# Patient Record
Sex: Female | Born: 1940
Health system: Southern US, Community
[De-identification: ages and names within clinical notes are randomized; demographics above are authoritative.]

## PROBLEM LIST (undated history)

## (undated) DIAGNOSIS — Z9889 Other specified postprocedural states: Secondary | ICD-10-CM

## (undated) DIAGNOSIS — E118 Type 2 diabetes mellitus with unspecified complications: Secondary | ICD-10-CM

## (undated) DIAGNOSIS — N183 Chronic kidney disease, stage 3 unspecified: Secondary | ICD-10-CM

## (undated) DIAGNOSIS — D649 Anemia, unspecified: Secondary | ICD-10-CM

## (undated) DIAGNOSIS — E876 Hypokalemia: Secondary | ICD-10-CM

## (undated) DIAGNOSIS — I38 Endocarditis, valve unspecified: Secondary | ICD-10-CM

## (undated) DIAGNOSIS — I2510 Atherosclerotic heart disease of native coronary artery without angina pectoris: Secondary | ICD-10-CM

## (undated) DIAGNOSIS — E785 Hyperlipidemia, unspecified: Secondary | ICD-10-CM

## (undated) DIAGNOSIS — I10 Essential (primary) hypertension: Secondary | ICD-10-CM

## (undated) DIAGNOSIS — I214 Non-ST elevation (NSTEMI) myocardial infarction: Secondary | ICD-10-CM

## (undated) DIAGNOSIS — R112 Nausea with vomiting, unspecified: Secondary | ICD-10-CM

## (undated) HISTORY — DX: Endocarditis, valve unspecified: I38

## (undated) HISTORY — PX: TOTAL HIP ARTHROPLASTY: SHX124

## (undated) HISTORY — PX: LUMBAR FUSION: SHX111

## (undated) HISTORY — PX: BACK SURGERY: SHX140

## (undated) HISTORY — DX: Type 2 diabetes mellitus with unspecified complications: E11.8

## (undated) HISTORY — DX: Hyperlipidemia, unspecified: E78.5

## (undated) HISTORY — PX: JOINT REPLACEMENT: SHX530

## (undated) HISTORY — DX: Chronic kidney disease, stage 3 (moderate): N18.3

## (undated) HISTORY — DX: Chronic kidney disease, stage 3 unspecified: N18.30

## (undated) HISTORY — PX: CORONARY STENT PLACEMENT: SHX1402

---

## 2004-12-07 ENCOUNTER — Ambulatory Visit: Payer: Self-pay | Admitting: Unknown Physician Specialty

## 2005-01-05 ENCOUNTER — Inpatient Hospital Stay: Payer: Self-pay | Admitting: Unknown Physician Specialty

## 2005-12-28 ENCOUNTER — Ambulatory Visit: Payer: Self-pay | Admitting: Unknown Physician Specialty

## 2007-01-09 ENCOUNTER — Ambulatory Visit: Payer: Self-pay | Admitting: Unknown Physician Specialty

## 2008-02-21 ENCOUNTER — Ambulatory Visit: Payer: Self-pay | Admitting: Unknown Physician Specialty

## 2008-02-28 ENCOUNTER — Ambulatory Visit: Payer: Self-pay | Admitting: Unknown Physician Specialty

## 2008-07-28 ENCOUNTER — Inpatient Hospital Stay (HOSPITAL_COMMUNITY): Admission: RE | Admit: 2008-07-28 | Discharge: 2008-08-04 | Payer: Self-pay | Admitting: Neurosurgery

## 2008-08-20 ENCOUNTER — Ambulatory Visit: Payer: Self-pay | Admitting: Vascular Surgery

## 2008-08-20 ENCOUNTER — Ambulatory Visit: Admission: RE | Admit: 2008-08-20 | Discharge: 2008-08-20 | Payer: Self-pay | Admitting: Neurosurgery

## 2008-08-20 ENCOUNTER — Encounter (INDEPENDENT_AMBULATORY_CARE_PROVIDER_SITE_OTHER): Payer: Self-pay | Admitting: Neurosurgery

## 2009-04-01 ENCOUNTER — Ambulatory Visit: Payer: Self-pay | Admitting: Unknown Physician Specialty

## 2010-05-07 ENCOUNTER — Ambulatory Visit: Payer: Self-pay | Admitting: Unknown Physician Specialty

## 2010-05-11 IMAGING — CR DG CHEST 2V
2 series · 2 of 2 positions shown · non-contrast
Comparison: None

CLINICAL DATA: Preadmission for back surgery

CHEST - 2 VIEW

[view not recorded (1 of 2)]
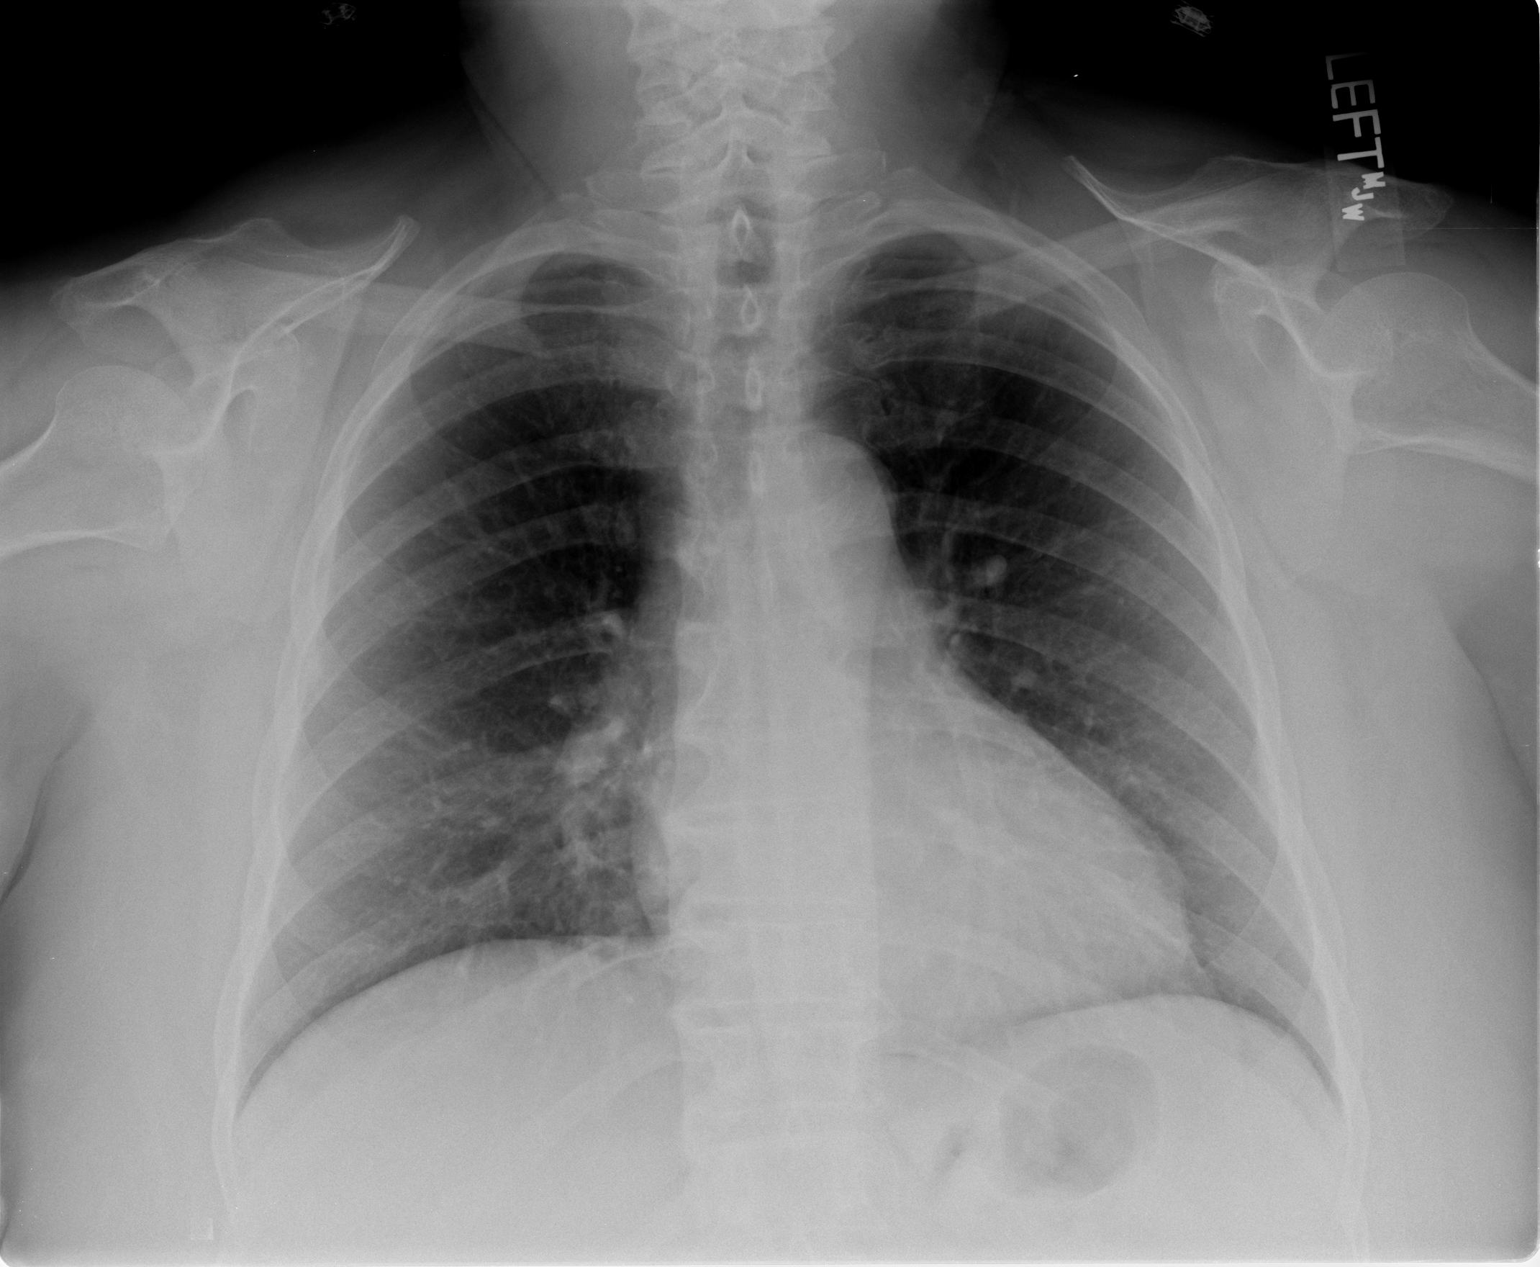

[view not recorded (2 of 2)]
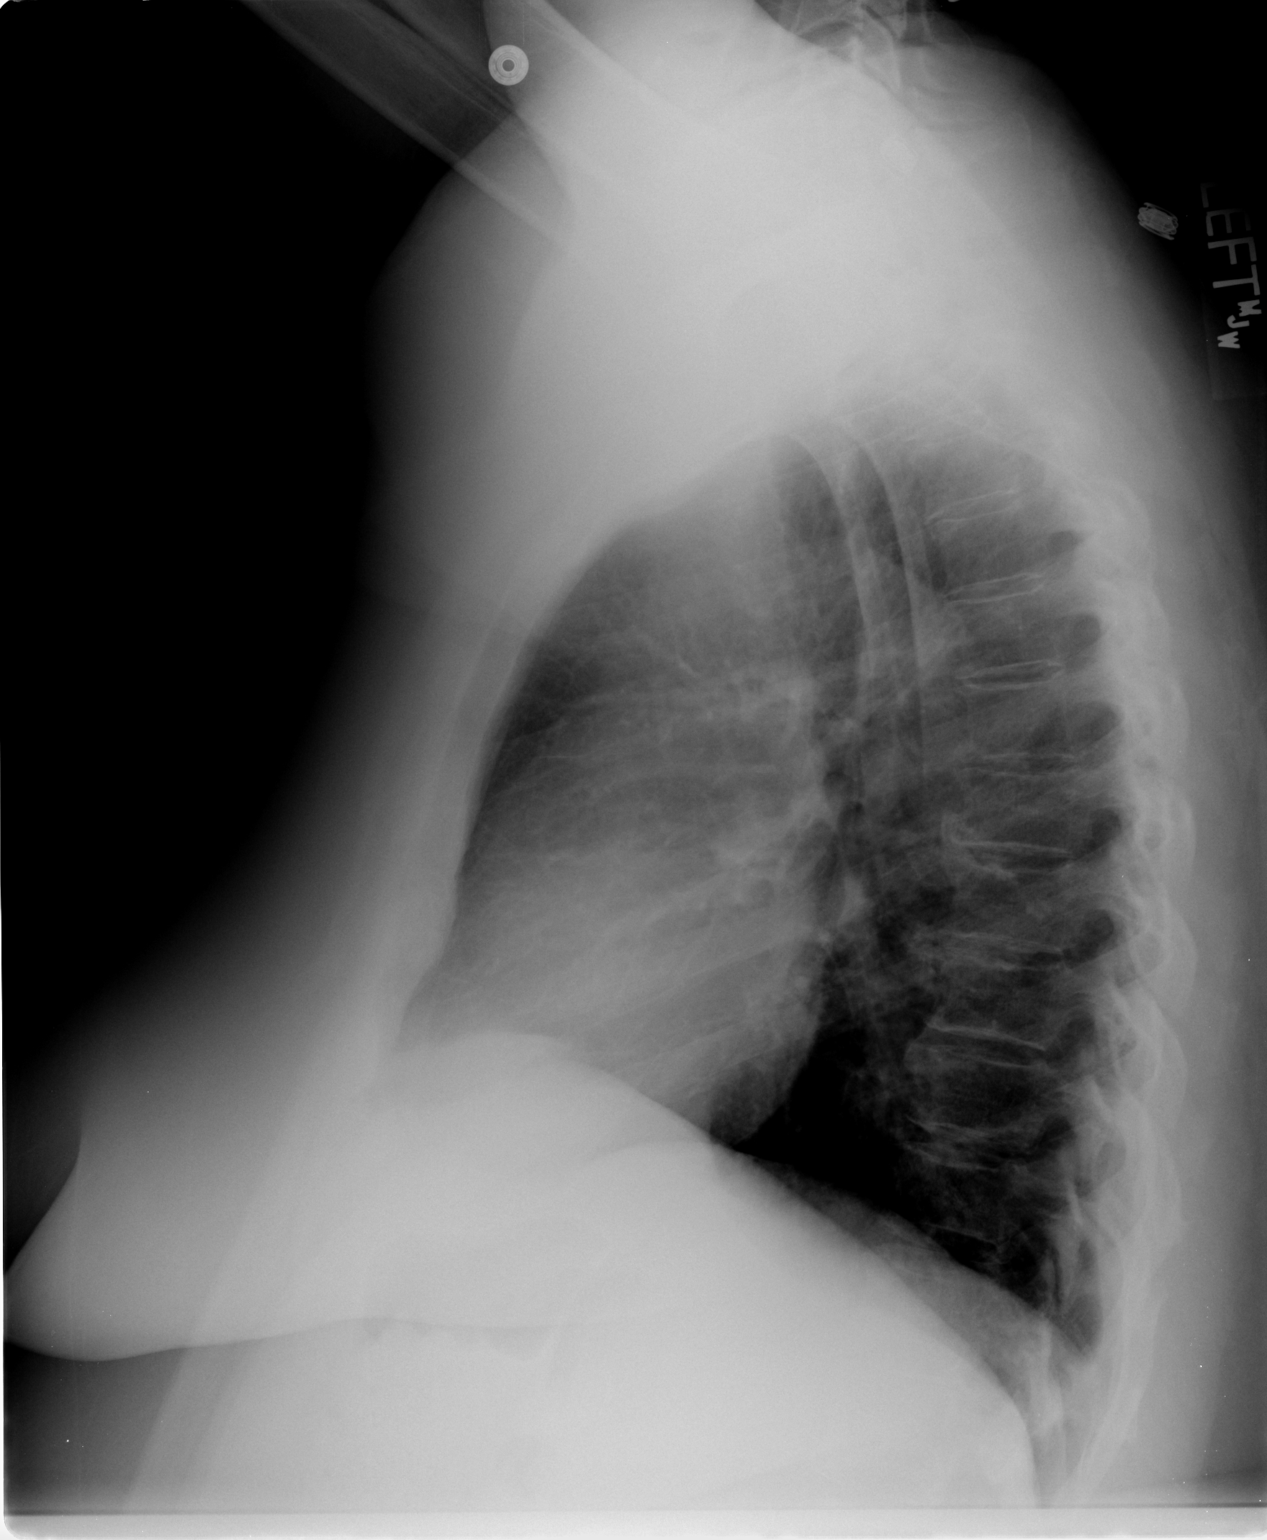

[2 of 2 positions shown; findings below may reference images not displayed]

FINDINGS: The heart size and mediastinal contours are normal.  The
lungs are clear.  There is no pleural effusion or pneumothorax.
Thoracic spine osteophytes are present.  There are no acute osseous
findings.
IMPRESSION: No active cardiopulmonary process.

## 2010-10-18 LAB — GLUCOSE, CAPILLARY
Glucose-Capillary: 101 mg/dL — ABNORMAL HIGH (ref 70–99)
Glucose-Capillary: 104 mg/dL — ABNORMAL HIGH (ref 70–99)
Glucose-Capillary: 105 mg/dL — ABNORMAL HIGH (ref 70–99)
Glucose-Capillary: 106 mg/dL — ABNORMAL HIGH (ref 70–99)
Glucose-Capillary: 134 mg/dL — ABNORMAL HIGH (ref 70–99)
Glucose-Capillary: 141 mg/dL — ABNORMAL HIGH (ref 70–99)
Glucose-Capillary: 200 mg/dL — ABNORMAL HIGH (ref 70–99)
Glucose-Capillary: 218 mg/dL — ABNORMAL HIGH (ref 70–99)
Glucose-Capillary: 224 mg/dL — ABNORMAL HIGH (ref 70–99)
Glucose-Capillary: 230 mg/dL — ABNORMAL HIGH (ref 70–99)

## 2010-10-18 LAB — CBC
HCT: 27.1 % — ABNORMAL LOW (ref 36.0–46.0)
HCT: 27.9 % — ABNORMAL LOW (ref 36.0–46.0)
HCT: 37.4 % (ref 36.0–46.0)
Hemoglobin: 12.2 g/dL (ref 12.0–15.0)
Hemoglobin: 9.1 g/dL — ABNORMAL LOW (ref 12.0–15.0)
Hemoglobin: 9.3 g/dL — ABNORMAL LOW (ref 12.0–15.0)
MCHC: 32.5 g/dL (ref 30.0–36.0)
MCHC: 33.3 g/dL (ref 30.0–36.0)
MCV: 91.6 fL (ref 78.0–100.0)
MCV: 91.8 fL (ref 78.0–100.0)
Platelets: 263 10*3/uL (ref 150–400)
RBC: 2.96 MIL/uL — ABNORMAL LOW (ref 3.87–5.11)
RDW: 13.8 % (ref 11.5–15.5)
RDW: 13.9 % (ref 11.5–15.5)
WBC: 13.8 10*3/uL — ABNORMAL HIGH (ref 4.0–10.5)

## 2010-10-18 LAB — BASIC METABOLIC PANEL
BUN: 19 mg/dL (ref 6–23)
CO2: 25 mEq/L (ref 19–32)
CO2: 27 mEq/L (ref 19–32)
Calcium: 9.5 mg/dL (ref 8.4–10.5)
Chloride: 104 mEq/L (ref 96–112)
GFR calc non Af Amer: 38 mL/min — ABNORMAL LOW (ref >60)
GFR calc non Af Amer: 47 mL/min — ABNORMAL LOW (ref >60)
Glucose, Bld: 169 mg/dL — ABNORMAL HIGH (ref 70–99)
Glucose, Bld: 76 mg/dL (ref 70–99)
Potassium: 4.7 mEq/L (ref 3.5–5.1)
Potassium: 5.1 mEq/L (ref 3.5–5.1)
Sodium: 136 mEq/L (ref 135–145)
Sodium: 139 mEq/L (ref 135–145)

## 2010-10-18 LAB — TYPE AND SCREEN
ABO/RH(D): A POS
Antibody Screen: POSITIVE
Donor AG Type: NEGATIVE
PT AG Type: NEGATIVE

## 2010-10-19 LAB — GLUCOSE, CAPILLARY: Glucose-Capillary: 127 mg/dL — ABNORMAL HIGH (ref 70–99)

## 2010-11-16 NOTE — Op Note (Signed)
Melissa Christian, Melissa Christian NO.:  [id number]   MEDICAL RECORD NO.:  [id number]          PATIENT TYPE:  INP   LOCATION:  3037                         FACILITY:  [facility]   PHYSICIAN:  Ophelia Charter, M.D.DATE OF BIRTH:  16-Aug-1940   DATE OF PROCEDURE:  07/28/2008  DATE OF DISCHARGE:                               OPERATIVE REPORT   BRIEF HISTORY:  The patient is a 70 year old white female who has  suffered from a back and leg pain.  She was worked up with a lumbar MRI  which demonstrated she had disk herniation and foraminal stenosis at L5-  S1.  I discussed the various treatment options with the patient  including surgery.  She has weighed the risks, benefits, and  alternatives of surgery and would like to proceed with an L5-S1  posterior decompression, instrumentation and fusion.   PREOPERATIVE DIAGNOSES:  L5-S1 disk degeneration, stenosis, lumbar  radiculopathy/myelopathy, lumbago.   POSTOPERATIVE DIAGNOSES:  L5-S1 disk degeneration, stenosis, lumbar  radiculopathy/myelopathy, lumbago.   PROCEDURE:  Bilateral L5 laminotomies and foraminotomies to decompress  the bilateral L5 as well as S1 nerve roots; L5-S1 posterior lumbar  interbody fusion with local morselized autograft bone and  Actifuse/Vitoss bone graft extender; insertion of L5-S1 interbody  prosthesis (Capstone PEEK interbody prosthesis); L5-S1 posterior  nonsegmental instrumentation with Legacy titanium pedicle screws and  rods; L5-S1 posterolateral arthrodesis with local morselized autograft  bone with Actifuse and Vitoss bone graft extenders.   SURGEON:  Ophelia Charter, MD   ASSISTANT:  Elizabeth Sauer, MD   ANESTHESIA:  General endotracheal.   ESTIMATED BLOOD LOSS:  250 mL.   SPECIMENS:  None.   DRAINS:  None.   COMPLICATIONS:  None.   DESCRIPTION OF PROCEDURE:  The patient was brought to the operating room  by the Anesthesia Team.  General endotracheal anesthesia was induced.  The patient  was then carefully turned to the prone position on the  Wilson frame.  Her lumbosacral region was then prepared with Betadine  scrub and Betadine solution.  Sterile drapes were applied.  I then  injected the area to be incised with Marcaine with epinephrine solution.  I used a scalpel to make a linear midline incision over the L5-S1  interspace.  I used electrocautery performing bilateral subperiosteal  dissection exposing the spinous process lamina of L4, L5 in the upper  sacrum.  We obtained intraoperative radiograph to confirm our location  and inserted the Versa-Trac retractor for exposure.   We began the decompression by using a high-speed drill to perform  bilateral L5 laminotomies.  Of note, the extent of work required to  decompress this nerve was greater than required due to posterior lumbar  interbody fusion because of the severe foraminal and lateral recess  stenosis.  We widened the foraminotomies with the Kerrison punch and  removed the L5-S1 ligamentum flavum and performed wide foraminotomies  about the L5-S1 nerve roots completing the decompression.   We now turned our attention to the posterior lumbar interbody fusion.  I  incised the L5-S1 intervertebral disk with a 15 blade scalpel  bilaterally.  The disk space was quite collapsed and spondylotic.  We  then cleared the vertebral endplates soft tissue using the pituitary  forceps and the Epstein and the Scoville curettes.  We then used the  trial sizers and first put the 8 x 26 mm spacer which seemed a bit  loose.  I then put a 10 x 26 pacer.  Again, it was a bit loose.  I then  placed a 12 x 26 spacer and it seems none.  We then prefilled the disk  space with local autograft bone, Actifuse and Vitoss and then inserted a  12 x 26 mm PEEK interbody prosthesis of course after retracting neural  structures out of harm's way.  When we did this, it seemed that the disk  space was more or less released in that the height of  disk space was  greatly increased.  I inspected the vertebral endplates to see if there  is any evidence of the prosthesis had entered into the vertebral body  and the vertebral endplates were intact.  At this point, the 12 x 26 mm  spacer seemed a bit loose so I removed it and then placed 14 x 26 spacer  bilaterally which were a bit more snug.  This completed the posterior  lumbar interbody fusion.  We did not have any untoward bleeding during  the PLIF.   We now turned our attention to the posterior instrumentation.  Under  fluoroscopic guidance, we cannulated the bilateral L5-S1 pedicles.  I  tapped the pedicle with a 5.5-mm tap and then inserted 7.5 x 45 mg  pedicle screws bilaterally at S1 and 7.5 x 15 bilaterally at L5.  This  was done under fluoroscopic guidance.  I should mention that prior to  placing the pedicle screws, we probed inside the tapped pedicles to rule  out cortical breeches.  We got a good purchase with all four screws.  I  then connected the pedicle screws with a lordotic rod.  We compressed  the construct and then secured around in place with the caps which was  tightened appropriately.  We then inspected the prosthesis which were at  this point quite snug in the interspace.   We now turned our attention to posterolateral arthrodesis.  We used a  high-speed drill to decorticate the remainder of the L5-S1 facet, pars  and L5 transverse prosthesis in the upper sacrum.  We laid a combination  of local autograft bone, Vitoss and Actifuse bone graft extenders over  these decorticated posterolateral structures.  This completed the  posterolateral arthrodesis.   We then obtained hemostasis using bipolar electrocautery.  We irrigated  the wound out with bacitracin solution.  We inspected the thecal sac and  bilateral L5 and S1 nerve roots and noted they were well decompressed.  We then removed the retractor and reapproximated the patient's  thoracolumbar fascia with  interrupted #1 Vicryl suture, the subcutaneous  tissue with 2-0 Vicryl suture and the skin with Steri-Strips and  benzoin.  The wound was then coated with bacitracin ointment.  A sterile  dressing was applied.  The drapes were removed.  The patient was  subsequently returned to the supine position where she was extubated by  the Anesthesia Team and transported to the postanesthesia care unit in  stable condition.  All sponge, instrument and needle counts were correct  at the end of this case.      Ophelia Charter, M.D.  Electronically Signed  JDJ/MEDQ  D:  07/29/2008  T:  07/30/2008  Job:  [id number]

## 2010-11-19 NOTE — Discharge Summary (Signed)
NAMEDENIZE, HANKE NO.:  [id number]   MEDICAL RECORD NO.:  [id number]          PATIENT TYPE:  INP   LOCATION:  3037                         FACILITY:  [facility]   PHYSICIAN:  Ophelia Charter, M.D.DATE OF BIRTH:  1940-10-13   DATE OF ADMISSION:  07/28/2008  DATE OF DISCHARGE:  08/04/2008                               DISCHARGE SUMMARY   BRIEF HISTORY:  The patient is a 70 year old white female who suffered  from back and leg pain.  She was worked up with a lumbar MRI, which  demonstrated she had degeneration, herniated disk, and foraminal  stenosis at L5-S1.  I discussed the various treatments with the patient  including surgery.  She has weighed the risks, benefits, and  alternatives of surgery and decided to proceed with an L5-S1 posterior  decompression, instrumentation and fusion.   For further details of this admission, please refer to history and  physical.   HOSPITAL COURSE:  Admitted the patient to [facility] on July 28, 2008.  On the day of admission, I performed L5-S1 decompression,  instrumentation and fusion.  Surgery went well (for full details of this  operation, please refer to typed operative note).   POSTOPERATIVE COURSE:  The patient's postoperative course was  unremarkable.  She was discharged home on August 04, 2008.   DISCHARGE INSTRUCTIONS:  The patient was given written discharge  instructions and instructed to follow up with me in 4 weeks.   DISCHARGE PRESCRIPTIONS:  1. Percocet 10/325, #100, 1 p.o. q.4 h. p.r.n. for pain.  2. Valium 5 mg, #50, 1 p.o. q.8 h. p.r.n. for muscle spasms.   FINAL DIAGNOSES:  1. L5-S1 degenerative disk disease and stenosis.  2. Lumbar radiculopathy/myelopathy.  3. Lumbago.   PROCEDURE PERFORMED:  Bilateral laminotomies and foraminotomies to  decompress the bilateral L5 as well as S1 nerve roots; L5-S1 posterior  lumbar interbody fusion with  local morselized autograft bone and  Actifuse/Vitoss bone graft extender; insertion of L5-S1 interbody  prosthesis (PEEK interbody prosthesis); L5-S1 posterior nonsegmental  instrumentation with Legacy titanium pedicle screws and rods; L5-S1  posterolateral arthrodesis with local morselized autograft bone and  Actifuse/Vitoss bone graft extenders.     Ophelia Charter, M.D.  Electronically Signed    JDJ/MEDQ  D:  09/18/2008  T:  09/19/2008  Job:  [id number]

## 2011-08-24 ENCOUNTER — Ambulatory Visit: Payer: Self-pay | Admitting: Unknown Physician Specialty

## 2012-08-27 ENCOUNTER — Ambulatory Visit: Payer: Self-pay | Admitting: Unknown Physician Specialty

## 2014-07-24 ENCOUNTER — Emergency Department: Payer: Self-pay | Admitting: Internal Medicine

## 2014-07-24 LAB — TROPONIN I

## 2014-07-24 LAB — COMPREHENSIVE METABOLIC PANEL
ALBUMIN: 3.7 g/dL (ref 3.4–5.0)
ALT: 20 U/L
AST: 14 U/L — AB (ref 15–37)
Alkaline Phosphatase: 116 U/L
Anion Gap: 7 (ref 7–16)
BUN: 22 mg/dL — AB (ref 7–18)
Bilirubin,Total: 0.4 mg/dL (ref 0.2–1.0)
CALCIUM: 8.4 mg/dL — AB (ref 8.5–10.1)
Chloride: 100 mmol/L (ref 98–107)
Co2: 27 mmol/L (ref 21–32)
Creatinine: 1.79 mg/dL — ABNORMAL HIGH (ref 0.60–1.30)
GFR CALC AF AMER: 36 — AB
GFR CALC NON AF AMER: 29 — AB
GLUCOSE: 336 mg/dL — AB (ref 65–99)
OSMOLALITY: 285 (ref 275–301)
POTASSIUM: 4.2 mmol/L (ref 3.5–5.1)
Sodium: 134 mmol/L — ABNORMAL LOW (ref 136–145)
TOTAL PROTEIN: 7.6 g/dL (ref 6.4–8.2)

## 2014-07-24 LAB — CBC
HCT: 39.5 % (ref 35.0–47.0)
HGB: 12.5 g/dL (ref 12.0–16.0)
MCH: 29.6 pg (ref 26.0–34.0)
MCHC: 31.7 g/dL — AB (ref 32.0–36.0)
MCV: 93 fL (ref 80–100)
PLATELETS: 216 10*3/uL (ref 150–440)
RBC: 4.23 10*6/uL (ref 3.80–5.20)
RDW: 13.8 % (ref 11.5–14.5)
WBC: 7.6 10*3/uL (ref 3.6–11.0)

## 2014-07-24 LAB — PRO B NATRIURETIC PEPTIDE: B-Type Natriuretic Peptide: 394 pg/mL — ABNORMAL HIGH (ref 0–125)

## 2014-11-21 ENCOUNTER — Other Ambulatory Visit: Payer: Self-pay | Admitting: Internal Medicine

## 2014-11-21 DIAGNOSIS — Z1231 Encounter for screening mammogram for malignant neoplasm of breast: Secondary | ICD-10-CM

## 2014-12-04 ENCOUNTER — Ambulatory Visit
Admission: RE | Admit: 2014-12-04 | Discharge: 2014-12-04 | Disposition: A | Discharge status: Home / Self Care | Payer: Medicare Other | Source: Ambulatory Visit | Attending: Internal Medicine | Admitting: Internal Medicine

## 2014-12-04 ENCOUNTER — Other Ambulatory Visit: Payer: Self-pay | Admitting: Internal Medicine

## 2014-12-04 DIAGNOSIS — R922 Inconclusive mammogram: Secondary | ICD-10-CM | POA: Insufficient documentation

## 2014-12-04 DIAGNOSIS — Z1231 Encounter for screening mammogram for malignant neoplasm of breast: Secondary | ICD-10-CM | POA: Diagnosis present

## 2015-09-22 ENCOUNTER — Emergency Department: Payer: Medicare Other

## 2015-09-22 ENCOUNTER — Inpatient Hospital Stay
Admission: EM | Admit: 2015-09-22 | Discharge: 2015-09-24 | DRG: 247 | Disposition: A | Discharge status: Home / Self Care | Payer: Medicare Other | Attending: Internal Medicine | Admitting: Internal Medicine

## 2015-09-22 ENCOUNTER — Encounter: Payer: Self-pay | Admitting: Emergency Medicine

## 2015-09-22 DIAGNOSIS — E1122 Type 2 diabetes mellitus with diabetic chronic kidney disease: Secondary | ICD-10-CM | POA: Diagnosis present

## 2015-09-22 DIAGNOSIS — I453 Trifascicular block: Secondary | ICD-10-CM | POA: Diagnosis present

## 2015-09-22 DIAGNOSIS — I2510 Atherosclerotic heart disease of native coronary artery without angina pectoris: Secondary | ICD-10-CM | POA: Insufficient documentation

## 2015-09-22 DIAGNOSIS — I10 Essential (primary) hypertension: Secondary | ICD-10-CM | POA: Diagnosis present

## 2015-09-22 DIAGNOSIS — I25110 Atherosclerotic heart disease of native coronary artery with unstable angina pectoris: Secondary | ICD-10-CM | POA: Diagnosis present

## 2015-09-22 DIAGNOSIS — Z7982 Long term (current) use of aspirin: Secondary | ICD-10-CM

## 2015-09-22 DIAGNOSIS — E785 Hyperlipidemia, unspecified: Secondary | ICD-10-CM | POA: Diagnosis present

## 2015-09-22 DIAGNOSIS — Z888 Allergy status to other drugs, medicaments and biological substances status: Secondary | ICD-10-CM

## 2015-09-22 DIAGNOSIS — Z7984 Long term (current) use of oral hypoglycemic drugs: Secondary | ICD-10-CM

## 2015-09-22 DIAGNOSIS — Z79899 Other long term (current) drug therapy: Secondary | ICD-10-CM

## 2015-09-22 DIAGNOSIS — Z96649 Presence of unspecified artificial hip joint: Secondary | ICD-10-CM | POA: Diagnosis present

## 2015-09-22 DIAGNOSIS — E119 Type 2 diabetes mellitus without complications: Secondary | ICD-10-CM

## 2015-09-22 DIAGNOSIS — R079 Chest pain, unspecified: Secondary | ICD-10-CM | POA: Diagnosis present

## 2015-09-22 DIAGNOSIS — I509 Heart failure, unspecified: Secondary | ICD-10-CM

## 2015-09-22 DIAGNOSIS — R7989 Other specified abnormal findings of blood chemistry: Secondary | ICD-10-CM

## 2015-09-22 DIAGNOSIS — I214 Non-ST elevation (NSTEMI) myocardial infarction: Principal | ICD-10-CM | POA: Diagnosis present

## 2015-09-22 DIAGNOSIS — Z833 Family history of diabetes mellitus: Secondary | ICD-10-CM

## 2015-09-22 DIAGNOSIS — Z8249 Family history of ischemic heart disease and other diseases of the circulatory system: Secondary | ICD-10-CM

## 2015-09-22 DIAGNOSIS — N183 Chronic kidney disease, stage 3 (moderate): Secondary | ICD-10-CM | POA: Diagnosis present

## 2015-09-22 DIAGNOSIS — R778 Other specified abnormalities of plasma proteins: Secondary | ICD-10-CM | POA: Diagnosis present

## 2015-09-22 DIAGNOSIS — Z881 Allergy status to other antibiotic agents status: Secondary | ICD-10-CM

## 2015-09-22 DIAGNOSIS — E7800 Pure hypercholesterolemia, unspecified: Secondary | ICD-10-CM | POA: Diagnosis present

## 2015-09-22 DIAGNOSIS — Z885 Allergy status to narcotic agent status: Secondary | ICD-10-CM

## 2015-09-22 DIAGNOSIS — I5032 Chronic diastolic (congestive) heart failure: Secondary | ICD-10-CM | POA: Diagnosis present

## 2015-09-22 DIAGNOSIS — Z9889 Other specified postprocedural states: Secondary | ICD-10-CM | POA: Insufficient documentation

## 2015-09-22 DIAGNOSIS — Z6841 Body Mass Index (BMI) 40.0 and over, adult: Secondary | ICD-10-CM

## 2015-09-22 DIAGNOSIS — E1159 Type 2 diabetes mellitus with other circulatory complications: Secondary | ICD-10-CM

## 2015-09-22 DIAGNOSIS — I130 Hypertensive heart and chronic kidney disease with heart failure and stage 1 through stage 4 chronic kidney disease, or unspecified chronic kidney disease: Secondary | ICD-10-CM | POA: Diagnosis present

## 2015-09-22 HISTORY — DX: Atherosclerotic heart disease of native coronary artery without angina pectoris: I25.10

## 2015-09-22 HISTORY — DX: Hypokalemia: E87.6

## 2015-09-22 HISTORY — DX: Essential (primary) hypertension: I10

## 2015-09-22 HISTORY — DX: Anemia, unspecified: D64.9

## 2015-09-22 LAB — TROPONIN I
TROPONIN I: 0.06 ng/mL — AB (ref <0.031)
TROPONIN I: 0.15 ng/mL — AB (ref <0.031)

## 2015-09-22 LAB — BASIC METABOLIC PANEL
ANION GAP: 9 (ref 5–15)
BUN: 31 mg/dL — ABNORMAL HIGH (ref 6–20)
CALCIUM: 9.1 mg/dL (ref 8.9–10.3)
CO2: 26 mmol/L (ref 22–32)
Chloride: 101 mmol/L (ref 101–111)
Creatinine, Ser: 1.7 mg/dL — ABNORMAL HIGH (ref 0.44–1.00)
GFR, EST AFRICAN AMERICAN: 33 mL/min — AB (ref >60)
GFR, EST NON AFRICAN AMERICAN: 28 mL/min — AB (ref >60)
Glucose, Bld: 266 mg/dL — ABNORMAL HIGH (ref 65–99)
POTASSIUM: 4 mmol/L (ref 3.5–5.1)
SODIUM: 136 mmol/L (ref 135–145)

## 2015-09-22 LAB — CBC
HCT: 39.7 % (ref 35.0–47.0)
HEMOGLOBIN: 13.2 g/dL (ref 12.0–16.0)
MCH: 30.4 pg (ref 26.0–34.0)
MCHC: 33.2 g/dL (ref 32.0–36.0)
MCV: 91.7 fL (ref 80.0–100.0)
PLATELETS: 276 10*3/uL (ref 150–440)
RBC: 4.32 MIL/uL (ref 3.80–5.20)
RDW: 14 % (ref 11.5–14.5)
WBC: 8.8 10*3/uL (ref 3.6–11.0)

## 2015-09-22 MED ORDER — ASPIRIN 81 MG PO CHEW
263.0000 mg | CHEWABLE_TABLET | Freq: Once | ORAL | Status: AC
  Administered 2015-09-22: 263 mg via ORAL
  Filled 2015-09-22: qty 3

## 2015-09-22 MED ORDER — FUROSEMIDE 10 MG/ML IJ SOLN
20.0000 mg | Freq: Once | INTRAMUSCULAR | Status: AC
  Administered 2015-09-22: 20 mg via INTRAVENOUS
  Filled 2015-09-22: qty 4

## 2015-09-22 NOTE — ED Provider Notes (Signed)
[facility] Emergency Department Provider Note  Time seen: 8:36 PM  I have reviewed the triage vital signs and the nursing notes.   HISTORY  Chief Complaint Chest Pain    HPI JAMIYA KELLAS is a 75 y.o. female with a past medical history of diabetes, hypertension, hyperlipidemia, CHF, presents the emergency department with intermittent chest pain over the past 2 days. According to the patient last night she developed a mild central chest discomfort, resolved after a short period. Today the patient once again developed a mild chest discomfort so she came to the emergency department for evaluation. Patient notes that upon arrival to the emergency department with chest pain was gone. States very mild shortness of breath. She did take one 10 mg Lasix tablet yesterday which she is prescribed when necessary. Denies any chest pain at this time. States when she was experiencing the chest pain it was very mild and felt more like a tightness sensation.Denies any symptoms at this time.     Past Medical History  Diagnosis Date  . Diabetes mellitus without complication ([place])   . Hypertension   . High cholesterol   . Anemia   . CHF (congestive heart failure) ([place])   . Low blood potassium     There are no active problems to display for this patient.   No past surgical history on file.  Current Outpatient Rx  Name  Route  Sig  Dispense  Refill  . aspirin EC 81 MG tablet   Oral   Take 81 mg by mouth daily.         . cholecalciferol (VITAMIN D) 1000 units tablet   Oral   Take 2,000 Units by mouth daily.         Marland Kitchen diltiazem (CARDIZEM CD) 240 MG 24 hr capsule   Oral   Take 480 mg by mouth daily.         . ferrous gluconate (FERGON) 324 MG tablet   Oral   Take 324 mg by mouth 2 (two) times daily with a meal.          . furosemide (LASIX) 40 MG tablet   Oral   Take 40 mg by mouth daily.         Marland Kitchen glipiZIDE (GLUCOTROL) 10 MG tablet   Oral   Take  20 mg by mouth 2 (two) times daily before a meal.         . hydrochlorothiazide (HYDRODIURIL) 25 MG tablet   Oral   Take 25 mg by mouth daily.         . metoprolol succinate (TOPROL-XL) 100 MG 24 hr tablet   Oral   Take 100 mg by mouth daily. Take with or immediately following a meal.         . pioglitazone (ACTOS) 30 MG tablet   Oral   Take 30 mg by mouth daily.         . potassium chloride (K-DUR,KLOR-CON) 10 MEQ tablet   Oral   Take 10 mEq by mouth daily.         . pravastatin (PRAVACHOL) 80 MG tablet   Oral   Take 80 mg by mouth at bedtime.         . sitaGLIPtin (JANUVIA) 50 MG tablet   Oral   Take 50 mg by mouth daily.         . valsartan (DIOVAN) 320 MG tablet   Oral   Take 320 mg by mouth daily.  Allergies Keflex; Percocet; and Vicodin  No family history on file.  Social History Social History  Substance Use Topics  . Smoking status: Never Smoker   . Smokeless tobacco: None  . Alcohol Use: No    Review of Systems Constitutional: Negative for fever. Cardiovascular: Intermittent chest pain. Respiratory: Mild shortness of breath. Gastrointestinal: Negative for abdominal pain Genitourinary: Negative for dysuria. Musculoskeletal: Negative for back pain. Neurological: Negative for headache 10-point ROS otherwise negative.  ____________________________________________   PHYSICAL EXAM:  VITAL SIGNS: ED Triage Vitals  Enc Vitals Group     BP 09/22/15 1753 189/76 mmHg     Pulse Rate 09/22/15 1753 77     Resp 09/22/15 1753 16     Temp 09/22/15 1753 97.4 F (36.3 C)     Temp Source 09/22/15 1753 Oral     SpO2 09/22/15 1753 99 %     Weight 09/22/15 1753 278 lb (126.1 kg)     Height 09/22/15 1753 5\' 4"  (1.626 m)     Head Cir --      Peak Flow --      Pain Score 09/22/15 1753 8     Pain Loc --      Pain Edu? --      Excl. in [place]? --     Constitutional: Alert and oriented. Well appearing and in no distress. Eyes: Normal  exam ENT   Head: Normocephalic and atraumatic   Mouth/Throat: Mucous membranes are moist. Cardiovascular: Normal rate, regular rhythm. No murmur Respiratory: Normal respiratory effort without tachypnea nor retractions. Breath sounds are clear Gastrointestinal: Soft and nontender. No distention.  Musculoskeletal: Nontender with normal range of motion in all extremities. Slight pedal edema. No tenderness. Neurologic:  Normal speech and language. No gross focal neurologic deficits Skin:  Skin is warm, dry and intact.  Psychiatric: Mood and affect are normal. Speech and behavior are normal.   ____________________________________________    EKG  EKG reviewed and interpreted by myself shows normal sinus rhythm at 76 bpm. Slightly widened QRS. Left axis deviation. Nonspecific but no concerning ST changes.  ____________________________________________    RADIOLOGY  Chest x-ray shows vascular congestion.  ____________________________________________   INITIAL IMPRESSION / ASSESSMENT AND PLAN / ED COURSE  Pertinent labs & imaging results that were available during my care of the patient were reviewed by me and considered in my medical decision making (see chart for details).  Patient presents the emergency department intermittent chest pain/tightness along with some mild shortness of breath. Chest x-ray shows vascular congestion. Patient states she takes Lasix just as needed, has not taken in the long time, but took it yesterday 10 mg. Denies any discomfort at this time. Denies any shortness of breath. Denies any chest pain. Patient states she has noted that over the past week or so the soreness breath has been worse when lying flat. I suspect the patient's discomfort and shortness of breath is likely due to to mild congestive heart failure exacerbation. We will repeat a troponin given a slight elevation which is likely due to renal insufficiency. If her repeat troponin is negative and  the patient remains asymptomatic we will likely discharge home with a short course of Lasix (5 days 20 mg daily) with cardiology follow-up. Patient is agreeable to this plan. I discussed with the patient any further chest pain, shortness breath she is to return to the emergency department for evaluation and likely admission.  Second troponin has elevated to 0.15. Given his elevation in troponin we will  admit the patient to the hospital for further treatment and monitoring. I will also dose IV Lasix.  ____________________________________________   FINAL CLINICAL IMPRESSION(S) / ED DIAGNOSES  Chest pain Dyspnea CHF  Harvest Dark, MD 09/22/15 2204

## 2015-09-22 NOTE — ED Notes (Signed)
Lab called with troponin of 0.06  Charge nurse raquel rn aware.

## 2015-09-22 NOTE — H&P (Signed)
Melissa Christian NAME: Kourtlyn Aberg    MR#:  [id number]  DATE OF BIRTH:  Jul 13, 1940  DATE OF ADMISSION:  09/22/2015  PRIMARY CARE PHYSICIAN: Glendon Axe, MD   REQUESTING/REFERRING PHYSICIAN: Kerman Passey, MD  CHIEF COMPLAINT:   Chief Complaint  Patient presents with  . Chest Pain    HISTORY OF PRESENT ILLNESS:  Melissa Christian  is a 75 y.o. female who presents with acute episode of chest pain. Patient states chest pain was central, pressure-like, radiating to the back. It lasted about one hour. It occurred while she was sitting at home at rest. She denies any exacerbating or alleviating factors. She came to the ED for evaluation. Here she was found to have an elevated troponin initially, though only mildly so. However her second troponin rose to 0.15. At this point hospitalists were called for further evaluation and admission.  PAST MEDICAL HISTORY:   Past Medical History  Diagnosis Date  . Diabetes mellitus without complication ([place])   . Hypertension   . High cholesterol   . Anemia   . CHF (congestive heart failure) ([place])   . Low blood potassium     PAST SURGICAL HISTORY:   Past Surgical History  Procedure Laterality Date  . Total hip arthroplasty    . Lumbar fusion      SOCIAL HISTORY:   Social History  Substance Use Topics  . Smoking status: Never Smoker   . Smokeless tobacco: Not on file  . Alcohol Use: No    FAMILY HISTORY:   Family History  Problem Relation Age of Onset  . Heart disease Father   . Diabetes Mother   . Hypertension Mother     DRUG ALLERGIES:   Allergies  Allergen Reactions  . Ferrous Sulfate Itching  . Keflex [Cephalexin] Itching  . Percocet [Oxycodone-Acetaminophen] Itching  . Vicodin [Hydrocodone-Acetaminophen] Itching    MEDICATIONS AT HOME:   Prior to Admission medications   Medication Sig Start Date End Date Taking? Authorizing Provider  aspirin EC 81 MG tablet  Take 81 mg by mouth daily.   Yes Historical Provider, MD  cholecalciferol (VITAMIN D) 1000 units tablet Take 2,000 Units by mouth daily.   Yes Historical Provider, MD  diltiazem (CARDIZEM CD) 240 MG 24 hr capsule Take 480 mg by mouth daily.   Yes Historical Provider, MD  ferrous gluconate (FERGON) 324 MG tablet Take 324 mg by mouth 2 (two) times daily with a meal.    Yes Historical Provider, MD  furosemide (LASIX) 40 MG tablet Take 40 mg by mouth daily as needed for edema.    Yes Historical Provider, MD  glipiZIDE (GLUCOTROL) 10 MG tablet Take 20 mg by mouth 2 (two) times daily before a meal.   Yes Historical Provider, MD  hydrochlorothiazide (HYDRODIURIL) 25 MG tablet Take 25 mg by mouth daily.   Yes Historical Provider, MD  metoprolol succinate (TOPROL-XL) 100 MG 24 hr tablet Take 100 mg by mouth daily. Take with or immediately following a meal.   Yes Historical Provider, MD  pioglitazone (ACTOS) 30 MG tablet Take 30 mg by mouth daily.   Yes Historical Provider, MD  potassium chloride (K-DUR,KLOR-CON) 10 MEQ tablet Take 10 mEq by mouth as needed (when taking Lasix).    Yes Historical Provider, MD  pravastatin (PRAVACHOL) 80 MG tablet Take 80 mg by mouth daily.    Yes Historical Provider, MD  sitaGLIPtin (JANUVIA) 50 MG tablet Take 50 mg by mouth daily.  Yes Historical Provider, MD  valsartan (DIOVAN) 320 MG tablet Take 320 mg by mouth daily.   Yes Historical Provider, MD    REVIEW OF SYSTEMS:  Review of Systems  Constitutional: Negative for fever, chills, weight loss and malaise/fatigue.  HENT: Negative for ear pain, hearing loss and tinnitus.   Eyes: Negative for blurred vision, double vision, pain and redness.  Respiratory: Negative for cough, hemoptysis and shortness of breath.   Cardiovascular: Positive for chest pain. Negative for palpitations, orthopnea and leg swelling.  Gastrointestinal: Negative for nausea, vomiting, abdominal pain, diarrhea and constipation.  Genitourinary:  Negative for dysuria, frequency and hematuria.  Musculoskeletal: Negative for back pain, joint pain and neck pain.  Skin:       No acne, rash, or lesions  Neurological: Negative for dizziness, tremors, focal weakness and weakness.  Endo/Heme/Allergies: Negative for polydipsia. Does not bruise/bleed easily.  Psychiatric/Behavioral: Negative for depression. The patient is not nervous/anxious and does not have insomnia.      VITAL SIGNS:   Filed Vitals:   09/22/15 1753  BP: 189/76  Pulse: 77  Temp: 97.4 F (36.3 C)  TempSrc: Oral  Resp: 16  Height: 5\' 4"  (1.626 m)  Weight: 126.1 kg (278 lb)  SpO2: 99%   Wt Readings from Last 3 Encounters:  09/22/15 126.1 kg (278 lb)    PHYSICAL EXAMINATION:  Physical Exam  Vitals reviewed. Constitutional: She is oriented to person, place, and time. She appears well-developed and well-nourished. No distress.  HENT:  Head: Normocephalic and atraumatic.  Mouth/Throat: Oropharynx is clear and moist.  Eyes: Conjunctivae and EOM are normal. Pupils are equal, round, and reactive to light. No scleral icterus.  Neck: Normal range of motion. Neck supple. No JVD present. No thyromegaly present.  Cardiovascular: Normal rate, regular rhythm and intact distal pulses.  Exam reveals no gallop and no friction rub.   No murmur heard. Respiratory: Effort normal and breath sounds normal. No respiratory distress. She has no wheezes. She has no rales.  GI: Soft. Bowel sounds are normal. She exhibits no distension. There is no tenderness.  Musculoskeletal: Normal range of motion. She exhibits no edema.  No arthritis, no gout  Lymphadenopathy:    She has no cervical adenopathy.  Neurological: She is alert and oriented to person, place, and time. No cranial nerve deficit.  No dysarthria, no aphasia  Skin: Skin is warm and dry. No rash noted. No erythema.  Psychiatric: She has a normal mood and affect. Her behavior is normal. Judgment and thought content normal.     LABORATORY PANEL:   CBC  Recent Labs Lab 09/22/15 1758  WBC 8.8  HGB 13.2  HCT 39.7  PLT 276   ------------------------------------------------------------------------------------------------------------------  Chemistries   Recent Labs Lab 09/22/15 1758  NA 136  K 4.0  CL 101  CO2 26  GLUCOSE 266*  BUN 31*  CREATININE 1.70*  CALCIUM 9.1   ------------------------------------------------------------------------------------------------------------------  Cardiac Enzymes  Recent Labs Lab 09/22/15 2049  TROPONINI 0.15*   ------------------------------------------------------------------------------------------------------------------  RADIOLOGY:  Dg Chest 2 View  09/22/2015  CLINICAL DATA:  Worsening central chest pain and shortness of breath, history of congestive heart failure EXAM: CHEST  2 VIEW COMPARISON:  07/24/2014 FINDINGS: Stable mild cardiac enlargement. Mild vascular congestion. No evidence of infiltrate or edema. No pleural effusion. IMPRESSION: Stable cardiac enlargement with vascular congestion and no acute findings. Electronically Signed   By: Skipper Cliche M.D.   On: 09/22/2015 18:28    EKG:   Orders placed or  performed during the hospital encounter of 09/22/15  . EKG 12-Lead  . EKG 12-Lead  . ED EKG within 10 minutes  . ED EKG within 10 minutes    IMPRESSION AND PLAN:  Principal Problem:   Chest pain - pain is resolved at this time. We'll continue to monitor on telemetry. Other treatment as below. Active Problems:   Elevated troponin - mildly elevated first check, rose to 0.5 second check. We will admit her on telemetry, superiorly trend her cardiac enzymes, get an echocardiogram and cardiology consult tomorrow.   Type 2 diabetes mellitus (HCC) - sliding scale insulin with corresponding glucose checks every 6 hours while nothing by mouth.   HTN (hypertension) - continue home meds   CHF (congestive heart failure) ([place]) - continue home  meds   HLD (hyperlipidemia) - continue home meds  All the records are reviewed and case discussed with ED provider. Management plans discussed with the patient and/or family.  DVT PROPHYLAXIS: SubQ heparin  GI PROPHYLAXIS: None  ADMISSION STATUS: Observation  CODE STATUS: Full Code Status History    This patient does not have a recorded code status. Please follow your organizational policy for patients in this situation.      TOTAL TIME TAKING CARE OF THIS PATIENT: 40 minutes.    Melissa Christian 09/22/2015, 10:50 PM  [facility]  Office  [phone]  CC: Primary care physician; Glendon Axe, MD

## 2015-09-22 NOTE — ED Notes (Signed)
Pt reports chest pain started last night but went away; came back this morning. Pt reports mid sternal chest pain that radiates to back, reports shortness of breath, nausea.

## 2015-09-23 ENCOUNTER — Encounter
Admission: EM | Disposition: A | Discharge status: Home / Self Care | Payer: Self-pay | Source: Home / Self Care | Attending: Internal Medicine

## 2015-09-23 ENCOUNTER — Inpatient Hospital Stay (HOSPITAL_COMMUNITY)
Admit: 2015-09-23 | Discharge: 2015-09-23 | Disposition: A | Discharge status: Home / Self Care | Payer: Medicare Other | Attending: Internal Medicine | Admitting: Internal Medicine

## 2015-09-23 DIAGNOSIS — I509 Heart failure, unspecified: Secondary | ICD-10-CM | POA: Diagnosis not present

## 2015-09-23 DIAGNOSIS — Z96649 Presence of unspecified artificial hip joint: Secondary | ICD-10-CM | POA: Diagnosis present

## 2015-09-23 DIAGNOSIS — Z7982 Long term (current) use of aspirin: Secondary | ICD-10-CM | POA: Diagnosis not present

## 2015-09-23 DIAGNOSIS — E7800 Pure hypercholesterolemia, unspecified: Secondary | ICD-10-CM | POA: Diagnosis present

## 2015-09-23 DIAGNOSIS — I10 Essential (primary) hypertension: Secondary | ICD-10-CM

## 2015-09-23 DIAGNOSIS — R079 Chest pain, unspecified: Secondary | ICD-10-CM | POA: Diagnosis present

## 2015-09-23 DIAGNOSIS — Z9889 Other specified postprocedural states: Secondary | ICD-10-CM | POA: Insufficient documentation

## 2015-09-23 DIAGNOSIS — I5032 Chronic diastolic (congestive) heart failure: Secondary | ICD-10-CM | POA: Diagnosis present

## 2015-09-23 DIAGNOSIS — I130 Hypertensive heart and chronic kidney disease with heart failure and stage 1 through stage 4 chronic kidney disease, or unspecified chronic kidney disease: Secondary | ICD-10-CM | POA: Diagnosis present

## 2015-09-23 DIAGNOSIS — I209 Angina pectoris, unspecified: Secondary | ICD-10-CM

## 2015-09-23 DIAGNOSIS — Z6841 Body Mass Index (BMI) 40.0 and over, adult: Secondary | ICD-10-CM | POA: Diagnosis not present

## 2015-09-23 DIAGNOSIS — I453 Trifascicular block: Secondary | ICD-10-CM | POA: Diagnosis present

## 2015-09-23 DIAGNOSIS — I25110 Atherosclerotic heart disease of native coronary artery with unstable angina pectoris: Secondary | ICD-10-CM | POA: Insufficient documentation

## 2015-09-23 DIAGNOSIS — Z79899 Other long term (current) drug therapy: Secondary | ICD-10-CM | POA: Diagnosis not present

## 2015-09-23 DIAGNOSIS — E785 Hyperlipidemia, unspecified: Secondary | ICD-10-CM | POA: Diagnosis present

## 2015-09-23 DIAGNOSIS — E1159 Type 2 diabetes mellitus with other circulatory complications: Secondary | ICD-10-CM | POA: Diagnosis not present

## 2015-09-23 DIAGNOSIS — N183 Chronic kidney disease, stage 3 (moderate): Secondary | ICD-10-CM | POA: Diagnosis present

## 2015-09-23 DIAGNOSIS — Z7984 Long term (current) use of oral hypoglycemic drugs: Secondary | ICD-10-CM | POA: Diagnosis not present

## 2015-09-23 DIAGNOSIS — Z888 Allergy status to other drugs, medicaments and biological substances status: Secondary | ICD-10-CM | POA: Diagnosis not present

## 2015-09-23 DIAGNOSIS — Z833 Family history of diabetes mellitus: Secondary | ICD-10-CM | POA: Diagnosis not present

## 2015-09-23 DIAGNOSIS — I214 Non-ST elevation (NSTEMI) myocardial infarction: Secondary | ICD-10-CM | POA: Diagnosis present

## 2015-09-23 DIAGNOSIS — E1122 Type 2 diabetes mellitus with diabetic chronic kidney disease: Secondary | ICD-10-CM | POA: Diagnosis present

## 2015-09-23 DIAGNOSIS — Z881 Allergy status to other antibiotic agents status: Secondary | ICD-10-CM | POA: Diagnosis not present

## 2015-09-23 DIAGNOSIS — Z885 Allergy status to narcotic agent status: Secondary | ICD-10-CM | POA: Diagnosis not present

## 2015-09-23 DIAGNOSIS — I2510 Atherosclerotic heart disease of native coronary artery without angina pectoris: Secondary | ICD-10-CM | POA: Insufficient documentation

## 2015-09-23 DIAGNOSIS — Z8249 Family history of ischemic heart disease and other diseases of the circulatory system: Secondary | ICD-10-CM | POA: Diagnosis not present

## 2015-09-23 HISTORY — PX: CARDIAC CATHETERIZATION: SHX172

## 2015-09-23 LAB — BASIC METABOLIC PANEL
ANION GAP: 8 (ref 5–15)
BUN: 35 mg/dL — ABNORMAL HIGH (ref 6–20)
CHLORIDE: 104 mmol/L (ref 101–111)
CO2: 26 mmol/L (ref 22–32)
Calcium: 9.1 mg/dL (ref 8.9–10.3)
Creatinine, Ser: 1.59 mg/dL — ABNORMAL HIGH (ref 0.44–1.00)
GFR calc Af Amer: 36 mL/min — ABNORMAL LOW (ref >60)
GFR, EST NON AFRICAN AMERICAN: 31 mL/min — AB (ref >60)
Glucose, Bld: 136 mg/dL — ABNORMAL HIGH (ref 65–99)
POTASSIUM: 4.3 mmol/L (ref 3.5–5.1)
SODIUM: 138 mmol/L (ref 135–145)

## 2015-09-23 LAB — CBC
HEMATOCRIT: 39.4 % (ref 35.0–47.0)
HEMOGLOBIN: 13.1 g/dL (ref 12.0–16.0)
MCH: 30.9 pg (ref 26.0–34.0)
MCHC: 33.2 g/dL (ref 32.0–36.0)
MCV: 93.1 fL (ref 80.0–100.0)
Platelets: 283 10*3/uL (ref 150–440)
RBC: 4.23 MIL/uL (ref 3.80–5.20)
RDW: 13.9 % (ref 11.5–14.5)
WBC: 10.4 10*3/uL (ref 3.6–11.0)

## 2015-09-23 LAB — GLUCOSE, CAPILLARY
GLUCOSE-CAPILLARY: 318 mg/dL — AB (ref 65–99)
GLUCOSE-CAPILLARY: 183 mg/dL — AB (ref 65–99)
Glucose-Capillary: 144 mg/dL — ABNORMAL HIGH (ref 65–99)
Glucose-Capillary: 200 mg/dL — ABNORMAL HIGH (ref 65–99)
Glucose-Capillary: 259 mg/dL — ABNORMAL HIGH (ref 65–99)

## 2015-09-23 LAB — TROPONIN I
TROPONIN I: 0.86 ng/mL — AB (ref <0.031)
Troponin I: 0.52 ng/mL — ABNORMAL HIGH (ref <0.031)
Troponin I: 0.59 ng/mL — ABNORMAL HIGH (ref <0.031)

## 2015-09-23 LAB — HEMOGLOBIN A1C: Hgb A1c MFr Bld: 8.8 % — ABNORMAL HIGH (ref 4.0–6.0)

## 2015-09-23 SURGERY — RIGHT AND LEFT HEART CATH
Anesthesia: Moderate Sedation

## 2015-09-23 MED ORDER — ENOXAPARIN SODIUM 40 MG/0.4ML ~~LOC~~ SOLN
40.0000 mg | Freq: Two times a day (BID) | SUBCUTANEOUS | Status: DC
  Administered 2015-09-24: 40 mg via SUBCUTANEOUS
  Filled 2015-09-23 (×2): qty 0.4

## 2015-09-23 MED ORDER — MIDAZOLAM HCL 2 MG/2ML IJ SOLN
INTRAMUSCULAR | Status: DC | PRN
  Administered 2015-09-23: 1 mg via INTRAVENOUS

## 2015-09-23 MED ORDER — BIVALIRUDIN BOLUS VIA INFUSION - CUPID
INTRAVENOUS | Status: DC | PRN
  Administered 2015-09-23: 95.1 mg via INTRAVENOUS

## 2015-09-23 MED ORDER — ACETAMINOPHEN 325 MG PO TABS: 650.0000 mg | ORAL_TABLET | Freq: Four times a day (QID) | ORAL | Status: DC | PRN

## 2015-09-23 MED ORDER — BIVALIRUDIN BOLUS VIA INFUSION
0.1000 mg/kg | Freq: Once | INTRAVENOUS | Status: DC
  Filled 2015-09-23: qty 13

## 2015-09-23 MED ORDER — SODIUM CHLORIDE 0.9 % IV SOLN
250.0000 mg | INTRAVENOUS | Status: DC | PRN
  Administered 2015-09-23: 1.75 mg/kg/h via INTRAVENOUS

## 2015-09-23 MED ORDER — SODIUM CHLORIDE 0.9 % IV SOLN: 250.0000 mL | INTRAVENOUS | Status: DC | PRN

## 2015-09-23 MED ORDER — FUROSEMIDE 40 MG PO TABS: 40.0000 mg | ORAL_TABLET | Freq: Every day | ORAL | Status: DC | PRN

## 2015-09-23 MED ORDER — SODIUM CHLORIDE 0.9 % WEIGHT BASED INFUSION
3.0000 mL/kg/h | INTRAVENOUS | Status: DC
Start: 2015-09-23 — End: 2015-09-24
  Administered 2015-09-23: 3 mL/kg/h via INTRAVENOUS

## 2015-09-23 MED ORDER — NITROGLYCERIN 1 MG/10 ML FOR IR/CATH LAB
INTRA_ARTERIAL | Status: DC | PRN
  Administered 2015-09-23: 200 ug via INTRACORONARY

## 2015-09-23 MED ORDER — TICAGRELOR 90 MG PO TABS
90.0000 mg | ORAL_TABLET | Freq: Two times a day (BID) | ORAL | Status: DC
  Administered 2015-09-23 – 2015-09-24 (×2): 90 mg via ORAL
  Filled 2015-09-23 (×2): qty 1

## 2015-09-23 MED ORDER — DILTIAZEM HCL ER COATED BEADS 240 MG PO CP24
480.0000 mg | ORAL_CAPSULE | Freq: Every day | ORAL | Status: DC
  Administered 2015-09-24: 480 mg via ORAL
  Filled 2015-09-23: qty 2

## 2015-09-23 MED ORDER — INSULIN ASPART 100 UNIT/ML ~~LOC~~ SOLN
0.0000 [IU] | Freq: Four times a day (QID) | SUBCUTANEOUS | Status: DC
  Administered 2015-09-23: 7 [IU] via SUBCUTANEOUS
  Administered 2015-09-23: 2 [IU] via SUBCUTANEOUS
  Filled 2015-09-23: qty 2
  Filled 2015-09-23: qty 7

## 2015-09-23 MED ORDER — FENTANYL CITRATE (PF) 100 MCG/2ML IJ SOLN
INTRAMUSCULAR | Status: DC | PRN
  Administered 2015-09-23: 25 ug via INTRAVENOUS

## 2015-09-23 MED ORDER — ONDANSETRON HCL 4 MG PO TABS: 4.0000 mg | ORAL_TABLET | Freq: Four times a day (QID) | ORAL | Status: DC | PRN

## 2015-09-23 MED ORDER — FENTANYL CITRATE (PF) 100 MCG/2ML IJ SOLN
INTRAMUSCULAR | Status: AC
  Filled 2015-09-23: qty 2

## 2015-09-23 MED ORDER — ONDANSETRON HCL 4 MG/2ML IJ SOLN: 4.0000 mg | Freq: Four times a day (QID) | INTRAMUSCULAR | Status: DC | PRN

## 2015-09-23 MED ORDER — HEPARIN (PORCINE) IN NACL 2-0.9 UNIT/ML-% IJ SOLN
INTRAMUSCULAR | Status: AC
  Filled 2015-09-23: qty 1000

## 2015-09-23 MED ORDER — NITROGLYCERIN 0.4 MG SL SUBL
0.4000 mg | SUBLINGUAL_TABLET | SUBLINGUAL | Status: DC | PRN
  Administered 2015-09-23: 0.4 mg via SUBLINGUAL
  Filled 2015-09-23: qty 1

## 2015-09-23 MED ORDER — INSULIN ASPART 100 UNIT/ML ~~LOC~~ SOLN
0.0000 [IU] | Freq: Three times a day (TID) | SUBCUTANEOUS | Status: DC
  Administered 2015-09-23: 8 [IU] via SUBCUTANEOUS
  Administered 2015-09-24: 5 [IU] via SUBCUTANEOUS
  Filled 2015-09-23: qty 5
  Filled 2015-09-23: qty 8

## 2015-09-23 MED ORDER — ACETAMINOPHEN 650 MG RE SUPP: 650.0000 mg | Freq: Four times a day (QID) | RECTAL | Status: DC | PRN

## 2015-09-23 MED ORDER — DILTIAZEM HCL ER COATED BEADS 240 MG PO CP24
480.0000 mg | ORAL_CAPSULE | Freq: Once | ORAL | Status: AC
  Administered 2015-09-23: 480 mg via ORAL
  Filled 2015-09-23: qty 2

## 2015-09-23 MED ORDER — IOHEXOL 300 MG/ML  SOLN
INTRAMUSCULAR | Status: DC | PRN
  Administered 2015-09-23: 420 mL via INTRA_ARTERIAL

## 2015-09-23 MED ORDER — NITROGLYCERIN 5 MG/ML IV SOLN
INTRAVENOUS | Status: AC
  Filled 2015-09-23: qty 10

## 2015-09-23 MED ORDER — INSULIN ASPART 100 UNIT/ML ~~LOC~~ SOLN: 0.0000 [IU] | Freq: Every day | SUBCUTANEOUS | Status: DC

## 2015-09-23 MED ORDER — SODIUM CHLORIDE 0.9 % IV SOLN
INTRAVENOUS | Status: DC
  Administered 2015-09-23: 03:00:00 via INTRAVENOUS

## 2015-09-23 MED ORDER — PRAVASTATIN SODIUM 20 MG PO TABS
80.0000 mg | ORAL_TABLET | Freq: Every day | ORAL | Status: DC
  Administered 2015-09-24: 80 mg via ORAL
  Filled 2015-09-23: qty 4

## 2015-09-23 MED ORDER — HYDROCHLOROTHIAZIDE 25 MG PO TABS
25.0000 mg | ORAL_TABLET | Freq: Every day | ORAL | Status: DC
  Administered 2015-09-24: 25 mg via ORAL
  Filled 2015-09-23: qty 1

## 2015-09-23 MED ORDER — ASPIRIN 81 MG PO CHEW: 81.0000 mg | CHEWABLE_TABLET | ORAL | Status: DC

## 2015-09-23 MED ORDER — SODIUM CHLORIDE 0.9% FLUSH: 3.0000 mL | INTRAVENOUS | Status: DC | PRN

## 2015-09-23 MED ORDER — HEPARIN SODIUM (PORCINE) 5000 UNIT/ML IJ SOLN
5000.0000 [IU] | Freq: Three times a day (TID) | INTRAMUSCULAR | Status: DC
  Administered 2015-09-23: 5000 [IU] via SUBCUTANEOUS
  Filled 2015-09-23: qty 1

## 2015-09-23 MED ORDER — MIDAZOLAM HCL 2 MG/2ML IJ SOLN
INTRAMUSCULAR | Status: AC
  Filled 2015-09-23: qty 2

## 2015-09-23 MED ORDER — TICAGRELOR 90 MG PO TABS
ORAL_TABLET | ORAL | Status: AC
  Filled 2015-09-23: qty 2

## 2015-09-23 MED ORDER — SODIUM CHLORIDE 0.9% FLUSH
3.0000 mL | Freq: Two times a day (BID) | INTRAVENOUS | Status: DC
  Administered 2015-09-23 – 2015-09-24 (×2): 3 mL via INTRAVENOUS

## 2015-09-23 MED ORDER — ASPIRIN 81 MG PO CHEW
81.0000 mg | CHEWABLE_TABLET | Freq: Every day | ORAL | Status: DC
  Administered 2015-09-24: 81 mg via ORAL
  Filled 2015-09-23: qty 1

## 2015-09-23 MED ORDER — SODIUM CHLORIDE 0.9% FLUSH
3.0000 mL | Freq: Two times a day (BID) | INTRAVENOUS | Status: DC
  Administered 2015-09-23 – 2015-09-24 (×3): 3 mL via INTRAVENOUS

## 2015-09-23 MED ORDER — BIVALIRUDIN 250 MG IV SOLR
INTRAVENOUS | Status: AC
  Filled 2015-09-23: qty 250

## 2015-09-23 MED ORDER — TICAGRELOR 90 MG PO TABS
ORAL_TABLET | ORAL | Status: DC | PRN
  Administered 2015-09-23: 180 mg via ORAL

## 2015-09-23 MED ORDER — IRBESARTAN 150 MG PO TABS
300.0000 mg | ORAL_TABLET | Freq: Every day | ORAL | Status: DC
  Administered 2015-09-24: 300 mg via ORAL
  Filled 2015-09-23: qty 2

## 2015-09-23 MED ORDER — ASPIRIN EC 81 MG PO TBEC: 81.0000 mg | DELAYED_RELEASE_TABLET | Freq: Every day | ORAL | Status: DC

## 2015-09-23 MED ORDER — ACETAMINOPHEN 325 MG PO TABS: 650.0000 mg | ORAL_TABLET | ORAL | Status: DC | PRN

## 2015-09-23 MED ORDER — METOPROLOL SUCCINATE ER 100 MG PO TB24
100.0000 mg | ORAL_TABLET | Freq: Every day | ORAL | Status: DC
  Administered 2015-09-24: 100 mg via ORAL
  Filled 2015-09-23: qty 1

## 2015-09-23 SURGICAL SUPPLY — 23 items
BALLN TREK RX 2.5X12 (BALLOONS) ×3
BALLOON TREK RX 2.5X12 (BALLOONS) ×1 IMPLANT
CATH INFINITI 5FR ANG PIGTAIL (CATHETERS) ×3 IMPLANT
CATH INFINITI 5FR JL4 (CATHETERS) ×3 IMPLANT
CATH INFINITI JR4 5F (CATHETERS) ×3 IMPLANT
CATH SWANZ 7F THERMO (CATHETERS) ×3 IMPLANT
CATH VISTA GUIDE 6FR JL3.5 SH (CATHETERS) ×3 IMPLANT
CATH VISTA GUIDE 6FR XB3 SH (CATHETERS) ×3 IMPLANT
CATH VISTA GUIDE 6FRXBLAD3.5SH (CATHETERS) ×3 IMPLANT
DEVICE CLOSURE MYNXGRIP 6/7F (Vascular Products) ×6 IMPLANT
DEVICE INFLAT 30 PLUS (MISCELLANEOUS) ×3 IMPLANT
GUIDEWIRE EMER 3M J .025X150CM (WIRE) ×3 IMPLANT
KIT MANI 3VAL PERCEP (MISCELLANEOUS) ×3 IMPLANT
KIT RIGHT HEART (MISCELLANEOUS) ×3 IMPLANT
NEEDLE PERC 18GX7CM (NEEDLE) ×3 IMPLANT
NEEDLE SMART 18G ACCESS (NEEDLE) ×3 IMPLANT
PACK CARDIAC CATH (CUSTOM PROCEDURE TRAY) ×3 IMPLANT
SHEATH AVANTI 5FR X 11CM (SHEATH) ×3 IMPLANT
SHEATH AVANTI 6FR X 11CM (SHEATH) ×3 IMPLANT
SHEATH PINNACLE 7F 10CM (SHEATH) ×3 IMPLANT
STENT XIENCE ALPINE RX 2.75X12 (Permanent Stent) ×3 IMPLANT
WIRE EMERALD 3MM-J .035X150CM (WIRE) ×3 IMPLANT
WIRE G HI TQ BMW 190 (WIRE) ×3 IMPLANT

## 2015-09-23 NOTE — Progress Notes (Signed)
Patient tolerating diet, advanced to heart/carb diet per order. Melissa Christian

## 2015-09-23 NOTE — Progress Notes (Signed)
Patient back from cath, vss. R groin clean dry and intact, no signs of bleeding or complications. No complaints at this time, will cont to assess. Melissa Christian

## 2015-09-23 NOTE — Care Management (Addendum)
Patient placed in  observation with chest pain.  Bump in troponins. Cardiology consult and heart cath.   Patient presents from home and independent in all adls.  Denies issues accessing medical care, obtaining medications, maintaining housing, utilities and food.   No discharge needs identified at present time.

## 2015-09-23 NOTE — Care Management Obs Status (Signed)
MEDICARE OBSERVATION STATUS NOTIFICATION   Patient Details  Name: CERA PFOST MRN: [id number] Date of Birth: 1940-12-02   Medicare Observation Status Notification Given:  Yes Patient placed in observation 09/22/2015 2250.  Inpatient 09/23/2015 12:39.    Patient is back on unit and sedated from procedure making it contraindicated to sign a document. Presented around 3pm and son signed.       Katrina Stack, RN 09/23/2015, 2:55 PM

## 2015-09-23 NOTE — ED Notes (Signed)
Pt given crackers at this time along with ginger ale, sitting up in bed eating and tolerating well, NAD noted.

## 2015-09-23 NOTE — OR Nursing (Signed)
Received for cath lab, groin site soft, dry and intact , patient reports being incont. Of urine requesting a foley cath. 16 french placed per protocol, patient cleaned and bed linens changed

## 2015-09-23 NOTE — Consult Note (Signed)
Cardiology Consultation Note  Patient ID: Melissa Christian, MRN: [id number], DOB/AGE: June 18, 1941 75 y.o. Admit date: 09/22/2015   Date of Consult: 09/23/2015 Primary Physician: Glendon Axe, MD Primary Cardiologist: New to [facility]  Chief Complaint: Chest pain, nausea, vomiting, diaphoresis  Reason for Consult: NSTEMI  HPI: 75 y.o. female with h/o chronic diastolic CHF, DM2 for at least 10 years, HTN, HLD, and morbid obesity who presented to [facility] on 3/21 with chest pain, nausea, vomiting, and diaphoresis and was found to have a NSTEMI. Cardiology is consulted for further evaluation.   She has no previously known cardiac history. She developed substernal chest pain on the evening of 3/20 that lasted for an unknown duration per the patient. This was followed by multiple episodes of nausea, vomiting, and diaphoresis on 3/21 lasting most of the day prompting her to come in for evaluation. Her pain initially presented at rest as she is not very active at baseline. She has never smoked tobacco, does not drink alcohol, and denies illegal drug abuse. Her weight has been stable. She denies any orthopnea, PND, LE edema, or early satiety. Her father had "multiple cardiac issues." She denies any "major health issues" for her mother.   Upon the patient's arrival to [facility] they were found to have an elevated troponin of 0.06-->0.15-->0.52. ECG showed NSR, 76 bpm, left axis deviation, trifascicular block, poor R wave progression, subtle anterior ST elevation, anterior Q waves. CXR showed stable cardiac enlargement with vascular congestion and no acute findings. SCr 1.70-->1.59. Patient was placed on subcutaneous heparin at 5,000 units tid. She has continued to have chest pain into this morning requiring SL NTG. She has an allergy to morphine.     Past Medical History  Diagnosis Date  . Diabetes mellitus without complication ([place])   . Hypertension   . High cholesterol   . Anemia   . CHF (congestive heart failure)  ([place])   . Low blood potassium       Most Recent Cardiac Studies: None.    Surgical History:  Past Surgical History  Procedure Laterality Date  . Total hip arthroplasty    . Lumbar fusion       Home Meds: Prior to Admission medications   Medication Sig Start Date End Date Taking? Authorizing Provider  aspirin EC 81 MG tablet Take 81 mg by mouth daily.   Yes Historical Provider, MD  cholecalciferol (VITAMIN D) 1000 units tablet Take 2,000 Units by mouth daily.   Yes Historical Provider, MD  diltiazem (CARDIZEM CD) 240 MG 24 hr capsule Take 480 mg by mouth daily.   Yes Historical Provider, MD  ferrous gluconate (FERGON) 324 MG tablet Take 324 mg by mouth 2 (two) times daily with a meal.    Yes Historical Provider, MD  furosemide (LASIX) 40 MG tablet Take 40 mg by mouth daily as needed for edema.    Yes Historical Provider, MD  glipiZIDE (GLUCOTROL) 10 MG tablet Take 20 mg by mouth 2 (two) times daily before a meal.   Yes Historical Provider, MD  hydrochlorothiazide (HYDRODIURIL) 25 MG tablet Take 25 mg by mouth daily.   Yes Historical Provider, MD  metoprolol succinate (TOPROL-XL) 100 MG 24 hr tablet Take 100 mg by mouth daily. Take with or immediately following a meal.   Yes Historical Provider, MD  pioglitazone (ACTOS) 30 MG tablet Take 30 mg by mouth daily.   Yes Historical Provider, MD  potassium chloride (K-DUR,KLOR-CON) 10 MEQ tablet Take 10 mEq by mouth as needed (when  taking Lasix).    Yes Historical Provider, MD  pravastatin (PRAVACHOL) 80 MG tablet Take 80 mg by mouth daily.    Yes Historical Provider, MD  sitaGLIPtin (JANUVIA) 50 MG tablet Take 50 mg by mouth daily.   Yes Historical Provider, MD  valsartan (DIOVAN) 320 MG tablet Take 320 mg by mouth daily.   Yes Historical Provider, MD    Inpatient Medications:  . aspirin EC  81 mg Oral Daily  . diltiazem  480 mg Oral Daily  . heparin  5,000 Units Subcutaneous 3 times per day  . hydrochlorothiazide  25 mg Oral Daily  .  insulin aspart  0-9 Units Subcutaneous Q6H  . irbesartan  300 mg Oral Daily  . metoprolol succinate  100 mg Oral Daily  . pravastatin  80 mg Oral Daily  . sodium chloride flush  3 mL Intravenous Q12H   . sodium chloride 50 mL/hr at 09/23/15 [id number]    Allergies:  Allergies  Allergen Reactions  . Ferrous Sulfate Itching  . Keflex [Cephalexin] Itching  . Percocet [Oxycodone-Acetaminophen] Itching  . Vicodin [Hydrocodone-Acetaminophen] Itching    Social History   Social History  . Marital Status: Married    Spouse Name: N/A  . Number of Children: N/A  . Years of Education: N/A   Occupational History  . Not on file.   Social History Main Topics  . Smoking status: Never Smoker   . Smokeless tobacco: Not on file  . Alcohol Use: No  . Drug Use: No  . Sexual Activity: Not on file   Other Topics Concern  . Not on file   Social History Narrative     Family History  Problem Relation Age of Onset  . Heart disease Father   . Diabetes Mother   . Hypertension Mother      Review of Systems: Review of Systems  Constitutional: Positive for malaise/fatigue and diaphoresis. Negative for fever, chills and weight loss.  HENT: Negative for congestion.   Eyes: Negative for discharge and redness.  Respiratory: Negative for cough, hemoptysis, sputum production, shortness of breath and wheezing.   Cardiovascular: Positive for chest pain. Negative for palpitations, orthopnea, claudication, leg swelling and PND.  Gastrointestinal: Positive for nausea and vomiting. Negative for heartburn and abdominal pain.  Musculoskeletal: Negative for myalgias and falls.  Skin: Negative for rash.  Neurological: Positive for weakness. Negative for dizziness, tingling, tremors, loss of consciousness and headaches.  Endo/Heme/Allergies: Does not bruise/bleed easily.  Psychiatric/Behavioral: Negative for substance abuse. The patient is not nervous/anxious.   All other systems reviewed and are  negative.   Labs:  Recent Labs  09/22/15 1758 09/22/15 2049 09/23/15 0626  TROPONINI 0.06* 0.15* 0.52*   Lab Results  Component Value Date   WBC 10.4 09/23/2015   HGB 13.1 09/23/2015   HCT 39.4 09/23/2015   MCV 93.1 09/23/2015   PLT 283 09/23/2015    Recent Labs Lab 09/23/15 0626  NA 138  K 4.3  CL 104  CO2 26  BUN 35*  CREATININE 1.59*  CALCIUM 9.1  GLUCOSE 136*   No results found for: CHOL, HDL, LDLCALC, TRIG No results found for: DDIMER  Radiology/Studies:  Dg Chest 2 View  09/22/2015  CLINICAL DATA:  Worsening central chest pain and shortness of breath, history of congestive heart failure EXAM: CHEST  2 VIEW COMPARISON:  07/24/2014 FINDINGS: Stable mild cardiac enlargement. Mild vascular congestion. No evidence of infiltrate or edema. No pleural effusion. IMPRESSION: Stable cardiac enlargement with vascular congestion and  no acute findings. Electronically Signed   By: Skipper Cliche M.D.   On: 09/22/2015 18:28    EKG: NSR, 76 bpm, left axis deviation, trifascicular block, poor R wave progression, subtle anterior ST elevation, anterior Q waves  Weights: Filed Weights   09/22/15 1753 09/23/15 0245 09/23/15 0355  Weight: 278 lb (126.1 kg) 280 lb 12.8 oz (127.37 kg) 279 lb 8 oz (126.78 kg)     Physical Exam: Blood pressure 152/63, pulse 71, temperature 98 F (36.7 C), temperature source Oral, resp. rate 16, height 5\' 4"  (1.626 m), weight 279 lb 8 oz (126.78 kg), SpO2 97 %. Body mass index is 47.95 kg/(m^2). General: Well developed, well nourished, in no acute distress. Head: Normocephalic, atraumatic, sclera non-icteric, no xanthomas, nares are without discharge, strabismus.  Neck: Negative for carotid bruits. JVD not elevated. Lungs: Clear bilaterally to auscultation without wheezes, rales, or rhonchi. Breathing is unlabored. Heart: RRR with S1 S2. No murmurs, rubs, or gallops appreciated. Abdomen: Obese, soft, non-tender, non-distended with normoactive  bowel sounds. No hepatomegaly. No rebound/guarding. No obvious abdominal masses. Msk:  Strength and tone appear normal for age. Extremities: No clubbing or cyanosis. No edema.  Distal pedal pulses are 2+ and equal bilaterally. Neuro: Alert and oriented X 3. No facial asymmetry. No focal deficit. Moves all extremities spontaneously. Psych:  Responds to questions appropriately with a normal affect.    Assessment and Plan:   1. NSTEMI: -Subtle anterior ST elevation without reciprocal ST depression -Take urgently for cardiac catheterization this morning  -She has already received full dose aspirin -Not on full dose heparin upon cariology receiving consult, patient was taken to the cath lab upon PA/MD seeing her by staff -Given SL NTG in an effort to make her chest pain free this morning -Continue aspirin, Toprol, and irbesartan -Change pravastatin to Lipitor  -Risks and benefits of cardiac catheterization have been discussed with the patient including risks of bleeding, bruising, infection, kidney damage, stroke, heart attack, and death. The patient understands these risks and is willing to proceed with the procedure. All questions have been answered and concerns listened to  2. Chronic diastolic CHF: -Echo is pending  -She does not appear to be volume overloaded at this time -Continue HF medications -Limit salt and PO fluids  3. CKD stage III: -Limit contrast during cath -Monitor closely post cath  4. HLD: -Change pravastatin to Lipitor as above -Check lipids  5. DM2: -Check A1C -Per IM  6. Morbid obesity: -Weight loss advised  7. HTN: -Poorly controlled -Status post cath may need to add further antihypertensives if she remains poorly controlled   Signed, Christell Faith, PA-C Pager: [phone] 09/23/2015, 8:05 AM

## 2015-09-23 NOTE — Progress Notes (Signed)
Lab notified nurse that patient troponins increased. Patient also notified nurse that chest pain had returned. Christell Faith, PA notified. Nitro ordered and given. Patient currently down in cath lab. Wilnette Kales

## 2015-09-23 NOTE — Care Management Obs Status (Signed)
[place] NOTIFICATION   Patient Details  Name: Melissa Christian MRN: [id number] Date of Birth: 16-Jun-1941   Medicare Observation Status Notification Given:  No  CM attempted to deliver observation notice as soon as arrived on unit but patient being transported off unit for heart cath    Katrina Stack, RN 09/23/2015, 8:27 AM

## 2015-09-23 NOTE — Care Management (Addendum)
Anticipate need for Brilinta coupon at discharge.  Confirms does have pharmacy coverage through her medicare plan

## 2015-09-23 NOTE — ED Notes (Signed)
Pt ambulatory to bathroom at this time with no concerns. Pt tolerated well, NAD noted.

## 2015-09-23 NOTE — Progress Notes (Signed)
[facility] [place] NAME: Melissa Christian    MR#:  [id number]  DATE OF BIRTH:  July 15, 1940  SUBJECTIVE:  CHIEF COMPLAINT:   Chief Complaint  Patient presents with  . Chest Pain   Chest pain-free at this time. Catheterization showed LAD stenosis and had PCI  REVIEW OF SYSTEMS:    Review of Systems  Constitutional: Negative for fever and chills.  HENT: Negative for sore throat.   Eyes: Negative for blurred vision, double vision and pain.  Respiratory: Negative for cough, hemoptysis, shortness of breath and wheezing.   Cardiovascular: Negative for chest pain, palpitations, orthopnea and leg swelling.  Gastrointestinal: Negative for heartburn, nausea, vomiting, abdominal pain, diarrhea and constipation.  Genitourinary: Negative for dysuria and hematuria.  Musculoskeletal: Negative for back pain and joint pain.  Skin: Negative for rash.  Neurological: Negative for sensory change, speech change, focal weakness and headaches.  Endo/Heme/Allergies: Does not bruise/bleed easily.  Psychiatric/Behavioral: Negative for depression. The patient is not nervous/anxious.     DRUG ALLERGIES:   Allergies  Allergen Reactions  . Ferrous Sulfate Itching  . Keflex [Cephalexin] Itching  . Percocet [Oxycodone-Acetaminophen] Itching  . Vicodin [Hydrocodone-Acetaminophen] Itching    VITALS:  Blood pressure 165/68, pulse 107, temperature 98.1 F (36.7 C), temperature source Oral, resp. rate 18, height 5\' 4"  (1.626 m), weight 126.78 kg (279 lb 8 oz), SpO2 97 %.  PHYSICAL EXAMINATION:   Physical Exam  GENERAL:  75 y.o.-year-old patient lying in the bed with no acute distress. Morbidly obese EYES: Pupils equal, round, reactive to light and accommodation. No scleral icterus. Extraocular muscles intact.  HEENT: Head atraumatic, normocephalic. Oropharynx and nasopharynx clear.  NECK:  Supple, no jugular venous distention. No thyroid enlargement, no  tenderness.  LUNGS: Normal breath sounds bilaterally, no wheezing, rales, rhonchi. No use of accessory muscles of respiration.  CARDIOVASCULAR: S1, S2 normal. No murmurs, rubs, or gallops.  ABDOMEN: Soft, nontender, nondistended. Bowel sounds present. No organomegaly or mass.  EXTREMITIES: No cyanosis, clubbing or edema b/l.    NEUROLOGIC: Cranial nerves II through XII are intact. No focal Motor or sensory deficits b/l.   PSYCHIATRIC: The patient is alert and oriented x 3.  SKIN: No obvious rash, lesion, or ulcer.   Right groin without any bleeding or swelling  LABORATORY PANEL:   CBC  Recent Labs Lab 09/23/15 0626  WBC 10.4  HGB 13.1  HCT 39.4  PLT 283   ------------------------------------------------------------------------------------------------------------------ Chemistries   Recent Labs Lab 09/23/15 0626  NA 138  K 4.3  CL 104  CO2 26  GLUCOSE 136*  BUN 35*  CREATININE 1.59*  CALCIUM 9.1   ------------------------------------------------------------------------------------------------------------------  Cardiac Enzymes  Recent Labs Lab 09/23/15 0626  TROPONINI 0.52*   ------------------------------------------------------------------------------------------------------------------  RADIOLOGY:  Dg Chest 2 View  09/22/2015  CLINICAL DATA:  Worsening central chest pain and shortness of breath, history of congestive heart failure EXAM: CHEST  2 VIEW COMPARISON:  07/24/2014 FINDINGS: Stable mild cardiac enlargement. Mild vascular congestion. No evidence of infiltrate or edema. No pleural effusion. IMPRESSION: Stable cardiac enlargement with vascular congestion and no acute findings. Electronically Signed   By: Skipper Cliche M.D.   On: 09/22/2015 18:28     ASSESSMENT AND PLAN:   1. NSTEMI: Mid LAD PCI with drug-eluting stent On aspirin, statin, beta blocker. Brillinta. Appreciate cardiology help  2. Chronic diastolic CHF: Stable. Continue home  medications.  3. CKD stage III: Monitor creatinine with contrast load from the catheterization  4. HLD: On statin  5. DM2: Sliding scale insulin and diabetic diet  6. Morbid obesity: -Weight loss advised  7. HTN: - Restart home medications  All the records are reviewed and case discussed with Care Management/Social Workerr. Management plans discussed with the patient, family and they are in agreement.  CODE STATUS: FULL  DVT Prophylaxis: SCDs  TOTAL TIME TAKING CARE OF THIS PATIENT: 30 minutes.   POSSIBLE D/C IN 1-2 DAYS, DEPENDING ON CLINICAL CONDITION.  Hillary Bow R M.D on 09/23/2015 at 2:52 PM  Between 7am to 6pm - Pager - [phone]  After 6pm go to [email or web address] - password [email or web address] [facility]  Office  [phone]  CC: Primary care physician; Glendon Axe, MD  Note: This dictation was prepared with Dragon dictation along with smaller phrase technology. Any transcriptional errors that result from this process are unintentional.

## 2015-09-24 ENCOUNTER — Encounter: Payer: Self-pay | Admitting: Cardiovascular Disease

## 2015-09-24 ENCOUNTER — Telehealth: Payer: Self-pay

## 2015-09-24 DIAGNOSIS — Z9889 Other specified postprocedural states: Secondary | ICD-10-CM

## 2015-09-24 LAB — LIPID PANEL
Cholesterol: 174 mg/dL (ref 0–200)
HDL: 48 mg/dL
LDL Cholesterol: 105 mg/dL — ABNORMAL HIGH (ref 0–99)
Total CHOL/HDL Ratio: 3.6 ratio
Triglycerides: 107 mg/dL
VLDL: 21 mg/dL (ref 0–40)

## 2015-09-24 LAB — GLUCOSE, CAPILLARY: Glucose-Capillary: 231 mg/dL — ABNORMAL HIGH (ref 65–99)

## 2015-09-24 LAB — ECHOCARDIOGRAM COMPLETE
HEIGHTINCHES: 64 in
WEIGHTICAEL: 4472 [oz_av]

## 2015-09-24 MED ORDER — TICAGRELOR 90 MG PO TABS: 90.0000 mg | ORAL_TABLET | Freq: Two times a day (BID) | ORAL | Status: DC

## 2015-09-24 NOTE — Telephone Encounter (Signed)
Patient contacted regarding discharge from [facility] on 09/24/15.  Patient understands to follow up with Dr. Yvone Neu on 10/12/15 at 11:15 at [facility]. Patient understands discharge instructions? yes Patient understands medications and regiment? yes Patient understands to bring all medications to this visit? yes  Spoke w/ pt's husband. Pt is not home from the hospital yet, but he will have her call back w/ any questions or concerns.

## 2015-09-24 NOTE — Discharge Instructions (Signed)

## 2015-09-24 NOTE — Progress Notes (Signed)
Patient: Melissa Christian / Admit Date: 09/22/2015 / Date of Encounter: 09/24/2015, 9:27 AM   Subjective: Feels well today, ambulating, no symptoms of chest pain or shortness of breath Would like to go home Mild tenderness in the right groin  Review of Systems: Review of Systems  Constitutional: Negative.   Respiratory: Negative.   Cardiovascular: Negative.   Gastrointestinal: Negative.   Musculoskeletal:       Right groin tenderness  Neurological: Negative.   Psychiatric/Behavioral: Negative.   All other systems reviewed and are negative.    Objective: Telemetry: Normal sinus rhythm, rate 88 bpm Physical Exam: Blood pressure 154/68, pulse 77, temperature 98 F (36.7 C), temperature source Oral, resp. rate 18, height 5\' 4"  (1.626 m), weight 271 lb 4.8 oz (123.061 kg), SpO2 97 %. Body mass index is 46.55 kg/(m^2). General: Well developed, well nourished, in no acute distress.obese Head: Normocephalic, atraumatic, sclera non-icteric, no xanthomas, nares are without discharge. Neck: Negative for carotid bruits. JVP not elevated. Lungs: Clear bilaterally to auscultation without wheezes, rales, or rhonchi. Breathing is unlabored. Heart: RRR S1 S2 without murmurs, rubs, or gallops.  Abdomen: Soft, non-tender, non-distended with normoactive bowel sounds. No rebound/guarding. Extremities: No clubbing or cyanosis. No edema. Distal pedal pulses are 2+ and equal bilaterally. Neuro: Alert and oriented X 3. Moves all extremities spontaneously. Psych:  Responds to questions appropriately with a normal affect.   Intake/Output Summary (Last 24 hours) at 09/24/15 0927 Last data filed at 09/24/15 0405  Gross per 24 hour  Intake    120 ml  Output   3180 ml  Net  -3060 ml    Inpatient Medications:  . aspirin  81 mg Oral Daily  . aspirin EC  81 mg Oral Daily  . bivalirudin  0.1 mg/kg Intravenous Once  . diltiazem  480 mg Oral Daily  . enoxaparin (LOVENOX) injection  40 mg Subcutaneous  Q12H  . hydrochlorothiazide  25 mg Oral Daily  . insulin aspart  0-15 Units Subcutaneous TID WC  . insulin aspart  0-5 Units Subcutaneous QHS  . irbesartan  300 mg Oral Daily  . metoprolol succinate  100 mg Oral Daily  . pravastatin  80 mg Oral Daily  . sodium chloride flush  3 mL Intravenous Q12H  . sodium chloride flush  3 mL Intravenous Q12H  . ticagrelor  90 mg Oral BID   Infusions:    Labs:  Recent Labs  09/22/15 1758 09/23/15 0626  NA 136 138  K 4.0 4.3  CL 101 104  CO2 26 26  GLUCOSE 266* 136*  BUN 31* 35*  CREATININE 1.70* 1.59*  CALCIUM 9.1 9.1   No results for input(s): AST, ALT, ALKPHOS, BILITOT, PROT, ALBUMIN in the last 72 hours.  Recent Labs  09/22/15 1758 09/23/15 0626  WBC 8.8 10.4  HGB 13.2 13.1  HCT 39.7 39.4  MCV 91.7 93.1  PLT 276 283    Recent Labs  09/22/15 2049 09/23/15 0626 09/23/15 1451 09/23/15 1823  TROPONINI 0.15* 0.52* 0.59* 0.86*   Invalid input(s): POCBNP  Recent Labs  09/23/15 0626  HGBA1C 8.8*     Weights: Filed Weights   09/23/15 0245 09/23/15 0355 09/24/15 0403  Weight: 280 lb 12.8 oz (127.37 kg) 279 lb 8 oz (126.78 kg) 271 lb 4.8 oz (123.061 kg)     Radiology/Studies:  Dg Chest 2 View  09/22/2015  CLINICAL DATA:  Worsening central chest pain and shortness of breath, history of congestive heart failure EXAM: CHEST  2 VIEW COMPARISON:  07/24/2014 FINDINGS: Stable mild cardiac enlargement. Mild vascular congestion. No evidence of infiltrate or edema. No pleural effusion. IMPRESSION: Stable cardiac enlargement with vascular congestion and no acute findings. Electronically Signed   By: Skipper Cliche M.D.   On: 09/22/2015 18:28     Assessment and Plan  75 y.o. female    1. NSTEMI: Severe stenosis of her mid LAD, DES placed yesterday without any complications Would continue aspirin 81 mg daily with brilinta Continue metoprolol Could change to Lipitor 80 mg daily  2. Chronic diastolic CHF: Would continue  outpatient medications, appears relatively euvolemic  3. CKD stage III: Will monitor renal function as an outpatient  4. Lipids  could increase statin, change to Lipitor 80 daily  5. DM2 management per primary care Hemoglobin A1c 8.8  6. Morbid obesity: -Weight loss advised  7. HTN: Running [id number] systolic If needed could add clonidine 0.1 mg twice a day, or isosorbide 30 mg daily, even Cardura     Signed, Esmond Plants, MD, Ph.D. [facility] 09/24/2015, 9:27 AM

## 2015-09-24 NOTE — Progress Notes (Signed)
Patient d/c'd home. Education provided, no questions at this time. Patient picked up by son. Telemetry removed. Wilnette Kales

## 2015-09-24 NOTE — Telephone Encounter (Signed)
-----   Message from Blain Pais sent at 09/24/2015 10:42 AM EDT ----- Regarding: tcm/ph 10/12/2015 Dr. Yvone Neu 11:15

## 2015-09-24 NOTE — Care Management (Signed)
Patient has been provided with Brilinta coupon

## 2015-09-24 NOTE — Discharge Summary (Signed)
[facility] [place] NAME: Melissa Christian    MR#:  [id number]  DATE OF BIRTH:  25-Nov-1940  DATE OF ADMISSION:  09/22/2015 ADMITTING PHYSICIAN: Lance Coon, MD  DATE OF DISCHARGE: 09/24/2015 12:08 PM  PRIMARY CARE PHYSICIAN: Singh,Jasmine, MD   ADMISSION DIAGNOSIS:  Chest pain, unspecified chest pain type [R07.9] Acute congestive heart failure, unspecified congestive heart failure type ([place]) [I50.9]  DISCHARGE DIAGNOSIS:  Principal Problem:   Chest pain Active Problems:   Elevated troponin   Type 2 diabetes mellitus (HCC)   HTN (hypertension)   HLD (hyperlipidemia)   CHF (congestive heart failure) (HCC)   Acute congestive heart failure (HCC)   NSTEMI (non-ST elevated myocardial infarction) ([place])   Morbid obesity due to excess calories ([place])   Coronary artery disease involving native coronary artery of native heart with unstable angina pectoris (HCC)   Pain in the chest   S/P cardiac cath   SECONDARY DIAGNOSIS:   Past Medical History  Diagnosis Date  . Diabetes mellitus without complication ([place])   . Hypertension   . High cholesterol   . Anemia   . Chronic diastolic CHF (congestive heart failure) ([place])   . Low blood potassium   . CAD (coronary artery disease)     a. NSTEMI 09/2015: cardiac cath: mLAD 95% s/p PCI/DES 0%, LCx no obs, RCA no obs, LVEF 55-65%     ADMITTING HISTORY  Melissa Christian is a 75 y.o. female who presents with acute episode of chest pain. Patient states chest pain was central, pressure-like, radiating to the back. It lasted about one hour. It occurred while she was sitting at home at rest. She denies any exacerbating or alleviating factors. She came to the ED for evaluation. Here she was found to have an elevated troponin initially, though only mildly so. However her second troponin rose to 0.15. At this point hospitalists were called for further evaluation and admission.  HOSPITAL COURSE:   1.  NSTEMI: Severe stenosis of her mid LAD, DES placed yesterday without any complications Would continue aspirin 81 mg daily with brilinta Continue metoprolol change to Lipitor 80 mg daily  2. Chronic diastolic CHF: continue outpatient medications, appears relatively euvolemic  3. CKD stage III: monitor renal function as an outpatient  4. Lipids  changed to Lipitor 80 daily  5. DM2 Continue home meds  6. Morbid obesity: -Weight loss advised  7. HTN: Well controlled  Stable for d/c home. Cardiac rehab ordered.  F/U with cardiology as OP  CONSULTS OBTAINED:  Treatment Team:  Minna Merritts, MD  DRUG ALLERGIES:   Allergies  Allergen Reactions  . Ferrous Sulfate Itching  . Keflex [Cephalexin] Itching  . Percocet [Oxycodone-Acetaminophen] Itching  . Vicodin [Hydrocodone-Acetaminophen] Itching    DISCHARGE MEDICATIONS:   Discharge Medication List as of 09/24/2015 11:39 AM    START taking these medications   Details  ticagrelor (BRILINTA) 90 MG TABS tablet Take 1 tablet (90 mg total) by mouth 2 (two) times daily., Starting 09/24/2015, Until Discontinued, Normal      CONTINUE these medications which have NOT CHANGED   Details  aspirin EC 81 MG tablet Take 81 mg by mouth daily., Until Discontinued, Historical Med    cholecalciferol (VITAMIN D) 1000 units tablet Take 2,000 Units by mouth daily., Until Discontinued, Historical Med    diltiazem (CARDIZEM CD) 240 MG 24 hr capsule Take 480 mg by mouth daily., Until Discontinued, Historical Med    ferrous gluconate (FERGON)  324 MG tablet Take 324 mg by mouth 2 (two) times daily with a meal. , Until Discontinued, Historical Med    furosemide (LASIX) 40 MG tablet Take 40 mg by mouth daily as needed for edema. , Until Discontinued, Historical Med    glipiZIDE (GLUCOTROL) 10 MG tablet Take 20 mg by mouth 2 (two) times daily before a meal., Until Discontinued, Historical Med    hydrochlorothiazide (HYDRODIURIL) 25 MG  tablet Take 25 mg by mouth daily., Until Discontinued, Historical Med    metoprolol succinate (TOPROL-XL) 100 MG 24 hr tablet Take 100 mg by mouth daily. Take with or immediately following a meal., Until Discontinued, Historical Med    pioglitazone (ACTOS) 30 MG tablet Take 30 mg by mouth daily., Until Discontinued, Historical Med    potassium chloride (K-DUR,KLOR-CON) 10 MEQ tablet Take 10 mEq by mouth as needed (when taking Lasix). , Until Discontinued, Historical Med    pravastatin (PRAVACHOL) 80 MG tablet Take 80 mg by mouth daily. , Until Discontinued, Historical Med    sitaGLIPtin (JANUVIA) 50 MG tablet Take 50 mg by mouth daily., Until Discontinued, Historical Med    valsartan (DIOVAN) 320 MG tablet Take 320 mg by mouth daily., Until Discontinued, Historical Med        Today   VITAL SIGNS:  Blood pressure 150/65, pulse 75, temperature 98.2 F (36.8 C), temperature source Oral, resp. rate 14, height 5\' 4"  (1.626 m), weight 123.061 kg (271 lb 4.8 oz), SpO2 98 %.  I/O:   Intake/Output Summary (Last 24 hours) at 09/24/15 1440 Last data filed at 09/24/15 0730  Gross per 24 hour  Intake    360 ml  Output   1580 ml  Net  -1220 ml    PHYSICAL EXAMINATION:  Physical Exam  GENERAL:  75 y.o.-year-old patient lying in the bed with no acute distress.  LUNGS: Normal breath sounds bilaterally, no wheezing, rales,rhonchi or crepitation. No use of accessory muscles of respiration.  CARDIOVASCULAR: S1, S2 normal. No murmurs, rubs, or gallops.  ABDOMEN: Soft, non-tender, non-distended. Bowel sounds present. No organomegaly or mass.  NEUROLOGIC: Moves all 4 extremities. PSYCHIATRIC: The patient is alert and oriented x 3.  SKIN: No obvious rash, lesion, or ulcer.   DATA REVIEW:   CBC  Recent Labs Lab 09/23/15 0626  WBC 10.4  HGB 13.1  HCT 39.4  PLT 283    Chemistries   Recent Labs Lab 09/23/15 0626  NA 138  K 4.3  CL 104  CO2 26  GLUCOSE 136*  BUN 35*  CREATININE  1.59*  CALCIUM 9.1    Cardiac Enzymes  Recent Labs Lab 09/23/15 1823  TROPONINI 0.86*    Microbiology Results  No results found for this or any previous visit.  RADIOLOGY:  Dg Chest 2 View  09/22/2015  CLINICAL DATA:  Worsening central chest pain and shortness of breath, history of congestive heart failure EXAM: CHEST  2 VIEW COMPARISON:  07/24/2014 FINDINGS: Stable mild cardiac enlargement. Mild vascular congestion. No evidence of infiltrate or edema. No pleural effusion. IMPRESSION: Stable cardiac enlargement with vascular congestion and no acute findings. Electronically Signed   By: Skipper Cliche M.D.   On: 09/22/2015 18:28    Follow up with PCP in 1 week.  Management plans discussed with the patient, family and they are in agreement.  CODE STATUS:     Code Status Orders        Start     Ordered   09/23/15 1141  Full code  Continuous     09/23/15 1141    Code Status History    Date Active Date Inactive Code Status Order ID Comments User Context   09/23/2015  3:16 AM 09/23/2015 11:41 AM Full Code [id number]  Lance Coon, MD Inpatient      TOTAL TIME TAKING CARE OF THIS PATIENT ON DAY OF DISCHARGE: more than 30 minutes.   Hillary Bow R M.D on 09/24/2015 at 2:40 PM  Between 7am to 6pm - Pager - [phone]  After 6pm go to [email or web address] - password [email or web address] [facility]  Office  [phone]  CC: Primary care physician; Glendon Axe, MD  Note: This dictation was prepared with Dragon dictation along with smaller phrase technology. Any transcriptional errors that result from this process are unintentional.

## 2015-10-12 ENCOUNTER — Ambulatory Visit (INDEPENDENT_AMBULATORY_CARE_PROVIDER_SITE_OTHER): Payer: Medicare Other | Admitting: Cardiology

## 2015-10-12 ENCOUNTER — Encounter: Payer: Self-pay | Admitting: Cardiology

## 2015-10-12 VITALS — BP 140/62 | HR 64 | Ht 64.0 in | Wt 268.8 lb

## 2015-10-12 DIAGNOSIS — I2510 Atherosclerotic heart disease of native coronary artery without angina pectoris: Secondary | ICD-10-CM

## 2015-10-12 DIAGNOSIS — E669 Obesity, unspecified: Secondary | ICD-10-CM

## 2015-10-12 DIAGNOSIS — I10 Essential (primary) hypertension: Secondary | ICD-10-CM | POA: Diagnosis not present

## 2015-10-12 DIAGNOSIS — E785 Hyperlipidemia, unspecified: Secondary | ICD-10-CM | POA: Diagnosis not present

## 2015-10-12 NOTE — Patient Instructions (Addendum)
Medication Instructions:  Your physician recommends that you continue on your current medications as directed. Please refer to the Current Medication list given to you today.   Labwork: Labs to be done by primary care physician  Testing/Procedures: None ordered  Follow-Up: Your physician recommends that you schedule a follow-up appointment in: 3 months with Dr. Yvone Neu.  Date & Time: _______________________________________________________________________   Any Other Special Instructions Will Be Listed Below (If Applicable).     If you need a refill on your cardiac medications before your next appointment, please call your pharmacy.  Exercise Guidelines During Cardiac Rehabilitation During cardiac rehabilitation, it is important to do more aerobic activities. Even simple lifestyle changes can help, such as parking farther from the store or taking the stairs instead of the elevator.  Discuss an appropriate exercise program with your cardiologist and rehabilitation therapist. It is important to design a program that is safe and effective for you. The program should meet your specific abilities and needs. Walking, biking, jogging, and swimming are all good aerobic activities. These take light to moderate effort. Adding some light resistance training is also important.  STRETCHING Stretching before you exercise warms up your muscles and prevents injury. Stretching also improves your flexibility, balance, coordination, and range of motion.  Stretch both before and after exercising.  Do not force a muscle or joint into a painful angle. Stretching should be a relaxing part of your exercise routine.  As soon as you feel resistance, hold the position and count to 10.  Go slowly when doing all stretches. TYPES OF EXERCISE Aerobic Exercise Aerobic exercise keeps joints and muscles moving. It involves large muscle groups. It is also rhythmic in nature and done for a longer period. Doing these  exercises improves circulation and endurance. Examples of aerobic exercise include:  Swimming.  Walking.  Hiking.  Jogging.  Cross-country skiing.  Biking. Static Exercise Static exercise uses muscles at high intensities without moving the joints. Pushing against a heavy couch that does not move is an example of static exercise. Static exercise improves strength but also quickly increases blood pressure. Therefore, you need to follow these guidelines:  Do not hold your breath while doing static exercises. Holding your breath during static exercises can raise your blood pressure to a dangerously high level.  Do not do static exercises if you have circulation problems or high blood pressure. Weight-Resistance Exercise Weight-resistance exercises are another important part of rehabilitation. These exercises strengthen your muscles by making them work against resistance. Examples include using free weights, weight-lifting machines, and large, specially designed rubber bands. Resistance exercises may help you return to activities of daily living sooner and improve your quality of life. They also help reduce cardiac risk factors. You will usually do weight-resistance exercises twice a week with a 2-day rest period between workouts. SETTING A PACE   Choose a pace that is comfortable for you. You should be able to talk with a friend while exercising. Slow down if you are short of breath or unable to speak while you exercise.  Keep track of how hard you are working as you exercise. Your rehabilitation therapist can teach you to use a scale to measure your level of exertion. Use this scale to make sure you are exercising at a level that is safe and effective for you. Cardiac rehabilitation and wellness facilities use numerical scales to rate your level of exertion. These scales usually rate your exertion level in a range from 6 to 20. A rating of  6 means that you are not exerting yourself at all. A  rating of 20 indicates that you are working very hard. For a healthy exercise session, your exertion rate goal should be between 11 and 15.   This information is not intended to replace advice given to you by your health care provider. Make sure you discuss any questions you have with your health care provider.   Document Released: 06/25/2013 Document Revised: 07/11/2014 Document Reviewed: 06/25/2013 Elsevier Interactive Patient Education 2016 Elsevier Inc.   Fat and Cholesterol Restricted Diet Getting too much fat and cholesterol in your diet may cause health problems. Following this diet helps keep your fat and cholesterol at normal levels. This can keep you from getting sick. WHAT TYPES OF FAT SHOULD I CHOOSE?  Choose monosaturated and polyunsaturated fats. These are found in foods such as olive oil, canola oil, flaxseeds, walnuts, almonds, and seeds.  Eat more omega-3 fats. Good choices include salmon, mackerel, sardines, tuna, flaxseed oil, and ground flaxseeds.  Limit saturated fats. These are in animal products such as meats, butter, and cream. They can also be in plant products such as palm oil, palm kernel oil, and coconut oil.   Avoid foods with partially hydrogenated oils in them. These contain trans fats. Examples of foods that have trans fats are stick margarine, some tub margarines, cookies, crackers, and other baked goods. WHAT GENERAL GUIDELINES DO I NEED TO FOLLOW?   Check food labels. Look for the words "trans fat" and "saturated fat."  When preparing a meal:  Fill half of your plate with vegetables and green salads.  Fill one fourth of your plate with whole grains. Look for the word "whole" as the first word in the ingredient list.  Fill one fourth of your plate with lean protein foods.  Limit fruit to two servings a day. Choose fruit instead of juice.  Eat more foods with soluble fiber. Examples of foods with this type of fiber are apples, broccoli, carrots,  beans, peas, and barley. Try to get 20-30 g (grams) of fiber per day.  Eat more home-cooked foods. Eat less at restaurants and buffets.  Limit or avoid alcohol.  Limit foods high in starch and sugar.  Limit fried foods.  Cook foods without frying them. Baking, boiling, grilling, and broiling are all great options.  Lose weight if you are overweight. Losing even a small amount of weight can help your overall health. It can also help prevent diseases such as diabetes and heart disease. WHAT FOODS CAN I EAT? Grains Whole grains, such as whole wheat or whole grain breads, crackers, cereals, and pasta. Unsweetened oatmeal, bulgur, barley, quinoa, or brown rice. Corn or whole wheat flour tortillas. Vegetables Fresh or frozen vegetables (raw, steamed, roasted, or grilled). Green salads. Fruits All fresh, canned (in natural juice), or frozen fruits. Meat and Other Protein Products Ground beef (85% or leaner), grass-fed beef, or beef trimmed of fat. Skinless chicken or [place]. Ground chicken or [place]. Pork trimmed of fat. All fish and seafood. Eggs. Dried beans, peas, or lentils. Unsalted nuts or seeds. Unsalted canned or dry beans. Dairy Low-fat dairy products, such as skim or 1% milk, 2% or reduced-fat cheeses, low-fat ricotta or cottage cheese, or plain low-fat yogurt. Fats and Oils Tub margarines without trans fats. Light or reduced-fat mayonnaise and salad dressings. Avocado. Olive, canola, sesame, or safflower oils. Natural peanut or almond butter (choose ones without added sugar and oil). The items listed above may not be a complete list  of recommended foods or beverages. Contact your dietitian for more options. WHAT FOODS ARE NOT RECOMMENDED? Grains White bread. White pasta. White rice. Cornbread. Bagels, pastries, and croissants. Crackers that contain trans fat. Vegetables White potatoes. Corn. Creamed or fried vegetables. Vegetables in a cheese sauce. Fruits Dried fruits. Canned  fruit in light or heavy syrup. Fruit juice. Meat and Other Protein Products Fatty cuts of meat. Ribs, chicken wings, bacon, sausage, bologna, salami, chitterlings, fatback, hot dogs, bratwurst, and packaged luncheon meats. Liver and organ meats. Dairy Whole or 2% milk, cream, half-and-half, and cream cheese. Whole milk cheeses. Whole-fat or sweetened yogurt. Full-fat cheeses. Nondairy creamers and whipped toppings. Processed cheese, cheese spreads, or cheese curds. Sweets and Desserts Corn syrup, sugars, honey, and molasses. Candy. Jam and jelly. Syrup. Sweetened cereals. Cookies, pies, cakes, donuts, muffins, and ice cream. Fats and Oils Butter, stick margarine, lard, shortening, ghee, or bacon fat. Coconut, palm kernel, or palm oils. Beverages Alcohol. Sweetened drinks (such as sodas, lemonade, and fruit drinks or punches). The items listed above may not be a complete list of foods and beverages to avoid. Contact your dietitian for more information.   This information is not intended to replace advice given to you by your health care provider. Make sure you discuss any questions you have with your health care provider.   Document Released: 12/20/2011 Document Revised: 07/11/2014 Document Reviewed: 09/19/2013 Elsevier Interactive Patient Education 2016 [place] Heart-healthy meal planning includes:  Limiting unhealthy fats.  Increasing healthy fats.  Making other small dietary changes. You may need to talk with your doctor or a diet specialist (dietitian) to create an eating plan that is right for you. WHAT TYPES OF FAT SHOULD I CHOOSE?  Choose healthy fats. These include olive oil and canola oil, flaxseeds, walnuts, almonds, and seeds.  Eat more omega-3 fats. These include salmon, mackerel, sardines, tuna, flaxseed oil, and ground flaxseeds. Try to eat fish at least twice each week.  Limit saturated fats.  Saturated fats are often found in  animal products, such as meats, butter, and cream.  Plant sources of saturated fats include palm oil, palm kernel oil, and coconut oil.  Avoid foods with partially hydrogenated oils in them. These include stick margarine, some tub margarines, cookies, crackers, and other baked goods. These contain trans fats. WHAT GENERAL GUIDELINES DO I NEED TO FOLLOW?  Check food labels carefully. Identify foods with trans fats or high amounts of saturated fat.  Fill one half of your plate with vegetables and green salads. Eat 4-5 servings of vegetables per day. A serving of vegetables is:  1 cup of raw leafy vegetables.   cup of raw or cooked cut-up vegetables.   cup of vegetable juice.  Fill one fourth of your plate with whole grains. Look for the word "whole" as the first word in the ingredient list.  Fill one fourth of your plate with lean protein foods.  Eat 4-5 servings of fruit per day. A serving of fruit is:  One medium whole fruit.   cup of dried fruit.   cup of fresh, frozen, or canned fruit.   cup of 100% fruit juice.  Eat more foods that contain soluble fiber. These include apples, broccoli, carrots, beans, peas, and barley. Try to get 20-30 g of fiber per day.  Eat more home-cooked food. Eat less restaurant, buffet, and fast food.  Limit or avoid alcohol.  Limit foods high in starch and sugar.  Avoid fried foods.  Avoid frying your food. Try baking, boiling, grilling, or broiling it instead. You can also reduce fat by:  Removing the skin from poultry.  Removing all visible fats from meats.  Skimming the fat off of stews, soups, and gravies before serving them.  Steaming vegetables in water or broth.  Lose weight if you are overweight.  Eat 4-5 servings of nuts, legumes, and seeds per week:  One serving of dried beans or legumes equals  cup after being cooked.  One serving of nuts equals 1 ounces.  One serving of seeds equals  ounce or one  tablespoon.  You may need to keep track of how much salt or sodium you eat. This is especially true if you have high blood pressure. Talk with your doctor or dietitian to get more information. WHAT FOODS CAN I EAT? Grains Breads, including [place], white, pita, wheat, raisin, rye, oatmeal, and [place]. Tortillas that are neither fried nor made with lard or trans fat. Low-fat rolls, including hotdog and hamburger buns and English muffins. Biscuits. Muffins. Waffles. Pancakes. Light popcorn. Whole-grain cereals. Flatbread. Melba toast. Pretzels. Breadsticks. Rusks. Low-fat snacks. Low-fat crackers, including oyster, saltine, matzo, graham, animal, and rye. Rice and pasta, including brown rice and pastas that are made with whole wheat.  Vegetables All vegetables.  Fruits All fruits, but limit coconut. Meats and Other Protein Sources Lean, well-trimmed beef, veal, pork, and lamb. Chicken and [place] without skin. All fish and shellfish. Wild duck, rabbit, pheasant, and venison. Egg whites or low-cholesterol egg substitutes. Dried beans, peas, lentils, and tofu. Seeds and most nuts. Dairy Low-fat or nonfat cheeses, including ricotta, string, and mozzarella. Skim or 1% milk that is liquid, powdered, or evaporated. Buttermilk that is made with low-fat milk. Nonfat or low-fat yogurt. Beverages Mineral water. Diet carbonated beverages. Sweets and Desserts Sherbets and fruit ices. Honey, jam, marmalade, jelly, and syrups. Meringues and gelatins. Pure sugar candy, such as hard candy, jelly beans, gumdrops, mints, marshmallows, and small amounts of dark chocolate. [employer] Eat all sweets and desserts in moderation. Fats and Oils Nonhydrogenated (trans-free) margarines. Vegetable oils, including soybean, sesame, sunflower, olive, peanut, safflower, corn, canola, and cottonseed. Salad dressings or mayonnaise made with a vegetable oil. Limit added fats and oils that you use for cooking, baking, salads,  and as spreads. Other Cocoa powder. Coffee and tea. All seasonings and condiments. The items listed above may not be a complete list of recommended foods or beverages. Contact your dietitian for more options. WHAT FOODS ARE NOT RECOMMENDED? Grains Breads that are made with saturated or trans fats, oils, or whole milk. Croissants. Butter rolls. Cheese breads. Sweet rolls. Donuts. Buttered popcorn. Chow mein noodles. High-fat crackers, such as cheese or butter crackers. Meats and Other Protein Sources Fatty meats, such as hotdogs, short ribs, sausage, spareribs, bacon, rib eye roast or steak, and mutton. High-fat deli meats, such as salami and bologna. Caviar. Domestic duck and goose. Organ meats, such as kidney, liver, sweetbreads, and heart. Dairy Cream, sour cream, cream cheese, and creamed cottage cheese. Whole-milk cheeses, including blue (bleu), [place] Jack, [place], [place], American, [place], Swiss, cheddar, [place], and [place]. Whole or 2% milk that is liquid, evaporated, or condensed. Whole buttermilk. Cream sauce or high-fat cheese sauce. Yogurt that is made from whole milk. Beverages Regular sodas and juice drinks with added sugar. Sweets and Desserts Frosting. Pudding. Cookies. Cakes other than angel food cake. Candy that has milk chocolate or white chocolate, hydrogenated fat, butter, coconut, or unknown ingredients. Buttered syrups.  Full-fat ice cream or ice cream drinks. Fats and Oils Gravy that has suet, meat fat, or shortening. Cocoa butter, hydrogenated oils, palm oil, coconut oil, palm kernel oil. These can often be found in baked products, candy, fried foods, nondairy creamers, and whipped toppings. Solid fats and shortenings, including bacon fat, salt pork, lard, and butter. Nondairy cream substitutes, such as coffee creamers and sour cream substitutes. Salad dressings that are made of unknown oils, cheese, or sour cream. The items listed above may not be a complete list of  foods and beverages to avoid. Contact your dietitian for more information.   This information is not intended to replace advice given to you by your health care provider. Make sure you discuss any questions you have with your health care provider.   Document Released: 12/20/2011 Document Revised: 07/11/2014 Document Reviewed: 12/12/2013 Elsevier Interactive Patient Education [employer].

## 2015-10-12 NOTE — Progress Notes (Signed)
Cardiology Office Note   Date:  10/12/2015   ID:  Kahealani, Vondrak 1941/01/12, MRN [id number]  Referring Doctor:  Glendon Axe, MD   Cardiologist:   Wende Bushy, MD   Reason for consultation:  Chief Complaint  Patient presents with  . Establish Care    follow up to CP      History of Present Illness: Melissa Christian is a 75 y.o. female who presents for  ffup for CAD, post hospital stay.  Patient doing quite well. She denies chest pain, shortness of breath. No episodes of lightheadedness, no loss of consciousness. She denies orthopnea, PND, edema, palpitations. No issues with bleeding. No issues with fall.  In terms of CAD, she has had no recurrence of her symptoms of chest pain. She is adamant she is very compliant with her antiplatelet medications as well as her other medications.  In terms of hypertension, blood pressure was within normal limits.  ROS:  Please see the history of present illness. Aside from mentioned under HPI, all other systems are reviewed and negative.     Past Medical History  Diagnosis Date  . Diabetes mellitus without complication ([place])   . Hypertension   . High cholesterol   . Anemia   . Chronic diastolic CHF (congestive heart failure) ([place])   . Low blood potassium   . CAD (coronary artery disease)     a. NSTEMI 09/2015: cardiac cath: mLAD 95% s/p PCI/DES 0%, LCx no obs, RCA no obs, LVEF 55-65%    Past Surgical History  Procedure Laterality Date  . Total hip arthroplasty    . Lumbar fusion    . Cardiac catheterization N/A 09/23/2015    Procedure: Right and Left Heart Cath;  Surgeon: Minna Merritts, MD;  Location: [facility];  Service: Cardiovascular;  Laterality: N/A;  . Cardiac catheterization N/A 09/23/2015    Procedure: Coronary Stent Intervention;  Surgeon: Yolonda Kida, MD;  Location: [facility];  Service: Cardiovascular;  Laterality: N/A;     reports that she has never smoked. She does not have any  smokeless tobacco history on file. She reports that she does not drink alcohol or use illicit drugs.   family history includes Diabetes in her mother; Heart disease in her father; Hypertension in her mother.   Current Outpatient Prescriptions  Medication Sig Dispense Refill  . aspirin EC 81 MG tablet Take 81 mg by mouth daily.    . cholecalciferol (VITAMIN D) 1000 units tablet Take 2,000 Units by mouth daily.    Marland Kitchen diltiazem (CARDIZEM CD) 240 MG 24 hr capsule Take 480 mg by mouth daily.    . ferrous gluconate (FERGON) 324 MG tablet Take 324 mg by mouth 2 (two) times daily with a meal.     . furosemide (LASIX) 40 MG tablet Take 40 mg by mouth daily as needed for edema.     Marland Kitchen glipiZIDE (GLUCOTROL) 10 MG tablet Take 20 mg by mouth 2 (two) times daily before a meal.    . hydrochlorothiazide (HYDRODIURIL) 25 MG tablet Take 25 mg by mouth daily.    . metoprolol succinate (TOPROL-XL) 100 MG 24 hr tablet Take 100 mg by mouth daily. Take with or immediately following a meal.    . pioglitazone (ACTOS) 30 MG tablet Take 30 mg by mouth daily.    . potassium chloride (K-DUR,KLOR-CON) 10 MEQ tablet Take 10 mEq by mouth as needed (when taking Lasix).     . pravastatin (  PRAVACHOL) 80 MG tablet Take 80 mg by mouth daily.     . sitaGLIPtin (JANUVIA) 50 MG tablet Take 50 mg by mouth daily.    . ticagrelor (BRILINTA) 90 MG TABS tablet Take 1 tablet (90 mg total) by mouth 2 (two) times daily. 180 tablet 0  . valsartan (DIOVAN) 320 MG tablet Take 320 mg by mouth daily.     No current facility-administered medications for this visit.    Allergies: Ferrous sulfate; Keflex; Penicillins; Percocet; Vicodin; and Ace inhibitors    PHYSICAL EXAM: VS:  BP 140/62 mmHg  Pulse 64  Ht 5\' 4"  (1.626 m)  Wt 268 lb 12.8 oz (121.927 kg)  BMI 46.12 kg/m2  SpO2 98% , Body mass index is 46.12 kg/(m^2). Wt Readings from Last 3 Encounters:  10/12/15 268 lb 12.8 oz (121.927 kg)  09/24/15 271 lb 4.8 oz (123.061 kg)      GENERAL:  well developed, well nourished, obese, not in acute distress HEENT: normocephalic, pink conjunctivae, anicteric sclerae, no xanthelasma, normal dentition, oropharynx clear NECK:  no neck vein engorgement, JVP normal, no hepatojugular reflux, carotid upstroke brisk and symmetric, no bruit, no thyromegaly, no lymphadenopathy LUNGS:  good respiratory effort, clear to auscultation bilaterally CV:  PMI not displaced, no thrills, no lifts, S1 and S2 within normal limits, no palpable S3 or S4, no murmurs, no rubs, no gallops RFA pulse within normal limits, no bruit ABD:  Soft, nontender, nondistended, normoactive bowel sounds, no abdominal aortic bruit, no hepatomegaly, no splenomegaly MS: nontender back, no kyphosis, no scoliosis, no joint deformities EXT:  2+ DP/PT pulses, no edema, no varicosities, no cyanosis, no clubbing SKIN: warm, nondiaphoretic, normal turgor, no ulcers NEUROPSYCH: alert, oriented to person, place, and time, sensory/motor grossly intact, normal mood, appropriate affect  Recent Labs: 09/23/2015: BUN 35*; Creatinine, Ser 1.59*; Hemoglobin 13.1; Platelets 283; Potassium 4.3; Sodium 138   Lipid Panel    Component Value Date/Time   CHOL 174 09/24/2015 0647   TRIG 107 09/24/2015 0647   HDL 48 09/24/2015 0647   CHOLHDL 3.6 09/24/2015 0647   VLDL 21 09/24/2015 0647   LDLCALC 105* 09/24/2015 0647     Other studies Reviewed:  EKG:  The ekg from 10/12/2015 was personally reviewed by me and it revealed sinus rhythm, 60 BPM. LAD, right bundle branch block, LAFB. Bifascicular block. No significant change from 09/23/2015 EKG.  Additional studies/ records that were reviewed personally reviewed by me today include:  Echocardiogram 09/23/2015: Left ventricle: The cavity size was normal. Wall thickness was  increased increased in a pattern of mild to moderate LVH.  Systolic function was normal. The estimated ejection fraction was  in the range of 55% to 60%.  Challenging image quality, Unable to  exclude mild mid to distal anterior wall and apical hypokinesis.  Left ventricular diastolic function parameters were normal. - Aortic valve: There was mild regurgitation. - Mitral valve: There was mild regurgitation. - Left atrium: The atrium was normal in size. - Right ventricle: Systolic function was normal. - Pulmonary arteries: Systolic pressure was within the normal  range.  Right left heart catheterization 09/23/2015:  Mid LAD lesion, 99% stenosed. Post intervention, there is a 0% residual stenosis. The lesion was previously treated with a stent (unknown type). Xience stent 2.75x12  ASSESSMENT AND PLAN:  NSTEMI status post PCI Xience stent to mid LAD, 09/23/2015,Dr. Arida Patient denies angina, shortness of breath. Offered cardiac rehabilitation, patient refuses due to transportation issues. Continue medical therapy: Aspirin 81 mg every daily,Brillinta (  dual antiplatelet therapy at least until 09/22/2016 due to NSTEMI), beta blocker, A RV, statin therapy. LDL goal less than 70.   HTN BP is well controlled. Continue monitoring BP. Continue current medical therapy and lifestyle changes.  Hyperlipidemia LDL goal less than 70. Continue statin therapy for now. We'll follow up on labs. Patient says she has one, not per PCP.   Morbid obesity  Body mass index is 46.12 kg/(m^2).Marland Kitchen Recommend aggressive weight loss through diet and increased physical activity.   Current medicines are reviewed at length with the patient today.  The patient does not have concerns regarding medicines.  Labs/ tests ordered today include:  Orders Placed This Encounter  Procedures  . EKG 12-Lead    I had a lengthy and detailed discussion with the patient regarding diagnoses, prognosis, diagnostic options, treatment options , and side effects of medications.   I counseled the patient on importance of lifestyle modification including heart healthy diet, regular  physical activity  , and smoking cessation.   Disposition:   FU with undersigned In 3 months  Signed, Wende Bushy, MD  10/12/2015 12:51 PM    [facility]

## 2015-12-22 ENCOUNTER — Telehealth: Payer: Self-pay | Admitting: Cardiology

## 2015-12-22 MED ORDER — TICAGRELOR 90 MG PO TABS: 90.0000 mg | ORAL_TABLET | Freq: Two times a day (BID) | ORAL | Status: DC

## 2015-12-22 NOTE — Telephone Encounter (Signed)
Refill sent for Brilinta 90 mg

## 2015-12-22 NOTE — Telephone Encounter (Signed)
°*  STAT* If patient is at the pharmacy, call can be transferred to refill team.   1. Which medications need to be refilled? (please list name of each medication and dose if known)   Brilinta 90 mg po BID  2. Which pharmacy/location (including street and city if local pharmacy) is medication to be sent to? [facility] [address]   3. Do they need a 30 day or 90 day supply? [place]

## 2015-12-28 ENCOUNTER — Other Ambulatory Visit: Payer: Self-pay | Admitting: Internal Medicine

## 2015-12-28 DIAGNOSIS — Z1231 Encounter for screening mammogram for malignant neoplasm of breast: Secondary | ICD-10-CM

## 2016-01-06 ENCOUNTER — Encounter: Payer: Self-pay | Admitting: Cardiology

## 2016-01-06 ENCOUNTER — Encounter (INDEPENDENT_AMBULATORY_CARE_PROVIDER_SITE_OTHER): Payer: Self-pay

## 2016-01-06 ENCOUNTER — Ambulatory Visit (INDEPENDENT_AMBULATORY_CARE_PROVIDER_SITE_OTHER): Payer: Medicare Other | Admitting: Cardiology

## 2016-01-06 VITALS — BP 140/62 | HR 73 | Ht 64.0 in | Wt 257.8 lb

## 2016-01-06 DIAGNOSIS — E785 Hyperlipidemia, unspecified: Secondary | ICD-10-CM | POA: Diagnosis not present

## 2016-01-06 DIAGNOSIS — E669 Obesity, unspecified: Secondary | ICD-10-CM | POA: Diagnosis not present

## 2016-01-06 DIAGNOSIS — I2510 Atherosclerotic heart disease of native coronary artery without angina pectoris: Secondary | ICD-10-CM | POA: Diagnosis not present

## 2016-01-06 DIAGNOSIS — I10 Essential (primary) hypertension: Secondary | ICD-10-CM | POA: Diagnosis not present

## 2016-01-06 NOTE — Progress Notes (Signed)
Cardiology Office Note   Date:  01/06/2016   ID:  Melissa Christian, DOB July 30, 1940, MRN [id number]  Referring Doctor:  Glendon Axe, MD   Cardiologist:   Wende Bushy, MD   Reason for consultation:  Chief Complaint  Patient presents with  . other    3 month follow up. Meds reviewed by the patient verbally. "doing well."      History of Present Illness: Melissa Christian is a 75 y.o. female who presents for  ffup for CAD.  Patient doing quite well. She denies chest pain, shortness of breath. No episodes of lightheadedness, no loss of consciousness. She denies orthopnea, PND, edema, palpitations. No issues with bleeding. No issues with fall.  In terms of CAD, she has had no recurrence of her symptoms of chest pain. She is having issues with medicine coverage, being in donut hole currently. She is still [employer] though.   In terms of hypertension, blood pressure is within normal limits.  ROS:  Please see the history of present illness. Aside from mentioned under HPI, all other systems are reviewed and negative.     Past Medical History  Diagnosis Date  . Diabetes mellitus without complication ([place])   . Hypertension   . High cholesterol   . Anemia   . Chronic diastolic CHF (congestive heart failure) ([place])   . Low blood potassium   . CAD (coronary artery disease)     a. NSTEMI 09/2015: cardiac cath: mLAD 95% s/p PCI/DES 0%, LCx no obs, RCA no obs, LVEF 55-65%    Past Surgical History  Procedure Laterality Date  . Total hip arthroplasty    . Lumbar fusion    . Cardiac catheterization N/A 09/23/2015    Procedure: Right and Left Heart Cath;  Surgeon: Minna Merritts, MD;  Location: [place] [facility];  Service: Cardiovascular;  Laterality: N/A;  . Cardiac catheterization N/A 09/23/2015    Procedure: Coronary Stent Intervention;  Surgeon: Yolonda Kida, MD;  Location: [place] [facility];  Service: Cardiovascular;  Laterality: N/A;     reports that she has  never smoked. She does not have any smokeless tobacco history on file. She reports that she does not drink alcohol or use illicit drugs.   family history includes Diabetes in her mother; Heart disease in her father; Hypertension in her mother.   Current Outpatient Prescriptions  Medication Sig Dispense Refill  . aspirin EC 81 MG tablet Take 81 mg by mouth daily.    . cholecalciferol (VITAMIN D) 1000 units tablet Take 2,000 Units by mouth daily.    Marland Kitchen diltiazem (CARDIZEM CD) 240 MG 24 hr capsule Take 480 mg by mouth daily.    . ferrous gluconate (FERGON) 324 MG tablet Take 324 mg by mouth 2 (two) times daily with a meal.     . furosemide (LASIX) 40 MG tablet Take 40 mg by mouth daily as needed for edema.     Marland Kitchen glipiZIDE (GLUCOTROL) 10 MG tablet Take 20 mg by mouth 2 (two) times daily before a meal.    . hydrochlorothiazide (HYDRODIURIL) 25 MG tablet Take 25 mg by mouth daily.    . metoprolol succinate (TOPROL-XL) 100 MG 24 hr tablet Take 100 mg by mouth daily. Take with or immediately following a meal.    . pioglitazone (ACTOS) 30 MG tablet Take 30 mg by mouth daily.    . potassium chloride (K-DUR,KLOR-CON) 10 MEQ tablet Take 10 mEq by mouth as needed (when taking Lasix).     Marland Kitchen  pravastatin (PRAVACHOL) 80 MG tablet Take 80 mg by mouth daily.     . sitaGLIPtin (JANUVIA) 50 MG tablet Take 50 mg by mouth daily.    . ticagrelor (BRILINTA) 90 MG TABS tablet Take 1 tablet (90 mg total) by mouth 2 (two) times daily. 180 tablet 3  . valsartan (DIOVAN) 320 MG tablet Take 320 mg by mouth daily.     No current facility-administered medications for this visit.    Allergies: Ferrous sulfate; Keflex; Penicillins; Percocet; Vicodin; and Ace inhibitors    PHYSICAL EXAM: VS:  BP 140/62 mmHg  Pulse 73  Ht 5\' 4"  (1.626 m)  Wt 257 lb 12 oz (116.915 kg)  BMI 44.22 kg/m2  SpO2 99% , Body mass index is 44.22 kg/(m^2). Wt Readings from Last 3 Encounters:  01/06/16 257 lb 12 oz (116.915 kg)  10/12/15 268 lb  12.8 oz (121.927 kg)  09/24/15 271 lb 4.8 oz (123.061 kg)    GENERAL:  well developed, well nourished, obese, not in acute distress HEENT: normocephalic, pink conjunctivae, anicteric sclerae, no xanthelasma, normal dentition, oropharynx clear NECK:  no neck vein engorgement, JVP normal, no hepatojugular reflux, carotid upstroke brisk and symmetric, no bruit, no thyromegaly, no lymphadenopathy LUNGS:  good respiratory effort, clear to auscultation bilaterally CV:  PMI not displaced, no thrills, no lifts, S1 and S2 within normal limits, no palpable S3 or S4, no murmurs, no rubs, no gallops RFA pulse within normal limits, no bruit ABD:  Soft, nontender, nondistended, normoactive bowel sounds, no abdominal aortic bruit, no hepatomegaly, no splenomegaly MS: nontender back, no kyphosis, no scoliosis, no joint deformities EXT:  2+ DP/PT pulses, no edema, no varicosities, no cyanosis, no clubbing SKIN: warm, nondiaphoretic, normal turgor, no ulcers NEUROPSYCH: alert, oriented to person, place, and time, sensory/motor grossly intact, normal mood, appropriate affect  Recent Labs: 09/23/2015: BUN 35*; Creatinine, Ser 1.59*; Hemoglobin 13.1; Platelets 283; Potassium 4.3; Sodium 138   Lipid Panel    Component Value Date/Time   CHOL 174 09/24/2015 0647   TRIG 107 09/24/2015 0647   HDL 48 09/24/2015 0647   CHOLHDL 3.6 09/24/2015 0647   VLDL 21 09/24/2015 0647   LDLCALC 105* 09/24/2015 0647     Other studies Reviewed:  EKG:  The ekg from 10/12/2015 was personally reviewed by me and it revealed sinus rhythm, 60 BPM. LAD, right bundle branch block, LAFB. Bifascicular block. No significant change from 09/23/2015 EKG.  Additional studies/ records that were reviewed personally reviewed by me today include:  Echocardiogram 09/23/2015: Left ventricle: The cavity size was normal. Wall thickness was  increased increased in a pattern of mild to moderate LVH.  Systolic function was normal. The estimated  ejection fraction was  in the range of 55% to 60%. Challenging image quality, Unable to  exclude mild mid to distal anterior wall and apical hypokinesis.  Left ventricular diastolic function parameters were normal. - Aortic valve: There was mild regurgitation. - Mitral valve: There was mild regurgitation. - Left atrium: The atrium was normal in size. - Right ventricle: Systolic function was normal. - Pulmonary arteries: Systolic pressure was within the normal  range.  Right left heart catheterization 09/23/2015:  Mid LAD lesion, 99% stenosed. Post intervention, there is a 0% residual stenosis. The lesion was previously treated with a stent (unknown type). Xience stent 2.75x12  ASSESSMENT AND PLAN:  NSTEMI status post PCI Xience stent to mid LAD, 09/23/2015,Dr. Arida Patient denies angina, shortness of breath. Offered cardiac rehabilitation, patient refuses due to transportation issues.  Continue medical therapy: Aspirin 81 mg every daily,Brillinta (dual antiplatelet therapy at least until 09/22/2016 due to NSTEMI), beta blocker, ARB, statin therapy. LDL goal less than 70.   HTN BP is well controlled. Continue monitoring BP. Continue current medical therapy and lifestyle changes.  Hyperlipidemia From [facility] 11/12/2015: TC 192 TG 150 HDL 46 LDL 116  LDL goal less than 70. Continue statin therapy for now as she is due for rpt blood work per PCP. She also has had weight loss. If LDL still > 70, consider switch to atorvastatin or rosuvastatin.   Morbid obesity  Body mass index is 44.22 kg/(m^2).Marland Kitchen Commended on weight loss! Recommend continue aggressive weight loss through diet and increased physical activity.   Aortic insufficiency Mild. No appreciable murmur. Continue to monitor.  Current medicines are reviewed at length with the patient today.  The patient does not have concerns regarding medicines.  Labs/ tests ordered today include:  No orders of the defined types were  placed in this encounter.    I had a lengthy and detailed discussion with the patient regarding diagnoses, prognosis, diagnostic options, treatment options , and side effects of medications.   I counseled the patient on importance of lifestyle modification including heart healthy diet, regular physical activity.   Disposition:   FU with undersigned In 6 months  Signed, Wende Bushy, MD  01/06/2016 9:44 AM    [facility]

## 2016-01-06 NOTE — Patient Instructions (Signed)
Medication Instructions:  Your physician recommends that you continue on your current medications as directed. Please refer to the Current Medication list given to you today.   Labwork: None ordered  Testing/Procedures: None ordered  Follow-Up: Your physician wants you to follow-up in: 6 months with Dr. Ingal. You will receive a reminder letter in the mail two months in advance. If you don't receive a letter, please call our office to schedule the follow-up appointment.  It was a pleasure seeing you today here in the office. Please do not hesitate to give us a call back if you have any further questions. [phone]  Pamela A. RN, BSN     Any Other Special Instructions Will Be Listed Below (If Applicable).     If you need a refill on your cardiac medications before your next appointment, please call your pharmacy.   

## 2016-01-12 ENCOUNTER — Ambulatory Visit
Admission: RE | Admit: 2016-01-12 | Discharge: 2016-01-12 | Disposition: A | Discharge status: Home / Self Care | Payer: Medicare Other | Source: Ambulatory Visit | Attending: Internal Medicine | Admitting: Internal Medicine

## 2016-01-12 ENCOUNTER — Other Ambulatory Visit: Payer: Self-pay | Admitting: Internal Medicine

## 2016-01-12 DIAGNOSIS — Z1231 Encounter for screening mammogram for malignant neoplasm of breast: Secondary | ICD-10-CM | POA: Insufficient documentation

## 2016-02-01 ENCOUNTER — Emergency Department
Admission: EM | Admit: 2016-02-01 | Discharge: 2016-02-01 | Disposition: A | Discharge status: Home / Self Care | Payer: Medicare Other | Attending: Emergency Medicine | Admitting: Emergency Medicine

## 2016-02-01 ENCOUNTER — Emergency Department: Payer: Medicare Other

## 2016-02-01 ENCOUNTER — Encounter: Payer: Self-pay | Admitting: *Deleted

## 2016-02-01 DIAGNOSIS — I110 Hypertensive heart disease with heart failure: Secondary | ICD-10-CM | POA: Diagnosis not present

## 2016-02-01 DIAGNOSIS — Z7984 Long term (current) use of oral hypoglycemic drugs: Secondary | ICD-10-CM | POA: Diagnosis not present

## 2016-02-01 DIAGNOSIS — E119 Type 2 diabetes mellitus without complications: Secondary | ICD-10-CM | POA: Diagnosis not present

## 2016-02-01 DIAGNOSIS — I5032 Chronic diastolic (congestive) heart failure: Secondary | ICD-10-CM | POA: Diagnosis not present

## 2016-02-01 DIAGNOSIS — I2510 Atherosclerotic heart disease of native coronary artery without angina pectoris: Secondary | ICD-10-CM | POA: Insufficient documentation

## 2016-02-01 DIAGNOSIS — K8020 Calculus of gallbladder without cholecystitis without obstruction: Secondary | ICD-10-CM | POA: Diagnosis not present

## 2016-02-01 DIAGNOSIS — Z7982 Long term (current) use of aspirin: Secondary | ICD-10-CM | POA: Diagnosis not present

## 2016-02-01 DIAGNOSIS — Z79899 Other long term (current) drug therapy: Secondary | ICD-10-CM | POA: Insufficient documentation

## 2016-02-01 DIAGNOSIS — R1011 Right upper quadrant pain: Secondary | ICD-10-CM | POA: Diagnosis present

## 2016-02-01 LAB — URINALYSIS COMPLETE WITH MICROSCOPIC (ARMC ONLY)
Bilirubin Urine: NEGATIVE
Hgb urine dipstick: NEGATIVE
Ketones, ur: NEGATIVE mg/dL
Leukocytes, UA: NEGATIVE
NITRITE: NEGATIVE
PROTEIN: NEGATIVE mg/dL
RBC / HPF: NONE SEEN RBC/hpf (ref 0–5)
SPECIFIC GRAVITY, URINE: 1.014 (ref 1.005–1.030)
pH: 6 (ref 5.0–8.0)

## 2016-02-01 LAB — COMPREHENSIVE METABOLIC PANEL
ALBUMIN: 4.5 g/dL (ref 3.5–5.0)
ALK PHOS: 124 U/L (ref 38–126)
ALT: 12 U/L — ABNORMAL LOW (ref 14–54)
AST: 18 U/L (ref 15–41)
Anion gap: 11 (ref 5–15)
BILIRUBIN TOTAL: 0.5 mg/dL (ref 0.3–1.2)
BUN: 32 mg/dL — AB (ref 6–20)
CO2: 23 mmol/L (ref 22–32)
Calcium: 9.5 mg/dL (ref 8.9–10.3)
Chloride: 102 mmol/L (ref 101–111)
Creatinine, Ser: 1.83 mg/dL — ABNORMAL HIGH (ref 0.44–1.00)
GFR calc Af Amer: 30 mL/min — ABNORMAL LOW (ref >60)
GFR calc non Af Amer: 26 mL/min — ABNORMAL LOW (ref >60)
GLUCOSE: 293 mg/dL — AB (ref 65–99)
POTASSIUM: 5 mmol/L (ref 3.5–5.1)
SODIUM: 136 mmol/L (ref 135–145)
TOTAL PROTEIN: 8.6 g/dL — AB (ref 6.5–8.1)

## 2016-02-01 LAB — CBC
HEMATOCRIT: 40.2 % (ref 35.0–47.0)
Hemoglobin: 13.5 g/dL (ref 12.0–16.0)
MCH: 30.9 pg (ref 26.0–34.0)
MCHC: 33.6 g/dL (ref 32.0–36.0)
MCV: 91.8 fL (ref 80.0–100.0)
Platelets: 278 10*3/uL (ref 150–440)
RBC: 4.37 MIL/uL (ref 3.80–5.20)
RDW: 14 % (ref 11.5–14.5)
WBC: 11.8 10*3/uL — ABNORMAL HIGH (ref 3.6–11.0)

## 2016-02-01 LAB — TROPONIN I

## 2016-02-01 LAB — LIPASE, BLOOD: Lipase: 41 U/L (ref 11–51)

## 2016-02-01 MED ORDER — ONDANSETRON 4 MG PO TBDP: 4.0000 mg | ORAL_TABLET | Freq: Four times a day (QID) | ORAL | 0 refills | Status: DC | PRN

## 2016-02-01 MED ORDER — ONDANSETRON 4 MG PO TBDP
4.0000 mg | ORAL_TABLET | Freq: Once | ORAL | Status: AC
  Administered 2016-02-01: 4 mg via ORAL
  Filled 2016-02-01: qty 1

## 2016-02-01 MED ORDER — DIATRIZOATE MEGLUMINE & SODIUM 66-10 % PO SOLN
15.0000 mL | ORAL | Status: AC
  Administered 2016-02-01: 15 mL via ORAL

## 2016-02-01 NOTE — ED Triage Notes (Signed)
Pt presents w/ c/o RUQ pain, n/v/diarrhea. Pt states diarrhea x 1, vomiting clear fluid and dry heaving x 6 between onset of pain at 2230 and 0400 today. Diaphoresis x 1 episode last night. Pt has hx of cardiac stenting.

## 2016-02-01 NOTE — ED Notes (Signed)
Patient transported to CT 

## 2016-02-01 NOTE — Discharge Instructions (Signed)
Follow-up with general surgery, call to setup an appointment.  Please return to the emergency room right away if you are to develop a fever, severe nausea, your pain becomes severe or worsens, you are unable to keep food down, begin vomiting any dark or bloody fluid, you develop any dark or bloody stools, feel dehydrated, or other new concerns or symptoms arise.

## 2016-02-01 NOTE — ED Provider Notes (Signed)
[facility] Emergency Department Provider Note   ____________________________________________   First MD Initiated Contact with Patient 02/01/16 [phone]     (approximate)  I have reviewed the triage vital signs and the nursing notes.   HISTORY  Chief Complaint Abdominal Pain    HPI Melissa Christian is a 75 y.o. female resents for evaluation of abdominal pain.  Patient notes that last night about 10:30, she began developing pain in the mid abdomen followed by vomiting a couple of times which she describes as yellowish and food. Then this morning she had one loose stool. She had fairly significant pain over the middle of the abdomen last night, but reports the pain is much better now but just continues with a light sense of nausea and fullness over the right mid abdomen.  No fevers or chills. No chest pain or trouble breathing.  No urinary symptoms.  No recent antibiotics. No recent hospitalization except when she had a heart problem in March. Denies travel history. No black or bloody stool  Past Medical History:  Diagnosis Date  . Anemia   . CAD (coronary artery disease)    a. NSTEMI 09/2015: cardiac cath: mLAD 95% s/p PCI/DES 0%, LCx no obs, RCA no obs, LVEF 55-65%  . Chronic diastolic CHF (congestive heart failure) ([place])   . Diabetes mellitus without complication ([place])   . High cholesterol   . Hypertension   . Low blood potassium     Patient Active Problem List   Diagnosis Date Noted  . Essential hypertension 01/06/2016  . Atherosclerosis of native coronary artery of native heart without angina pectoris 10/12/2015  . Hyperlipidemia 10/12/2015  . Obesity 10/12/2015  . Acute congestive heart failure ([place])   . NSTEMI (non-ST elevated myocardial infarction) ([place])   . Morbid obesity due to excess calories ([place])   . Coronary artery disease involving native coronary artery of native heart with unstable angina pectoris ([place])   . Pain in the chest   .  S/P cardiac cath   . Chest pain 09/22/2015  . Elevated troponin 09/22/2015  . Type 2 diabetes mellitus ([place]) 09/22/2015  . HTN (hypertension) 09/22/2015  . HLD (hyperlipidemia) 09/22/2015  . CHF (congestive heart failure) ([place]) 09/22/2015    Past Surgical History:  Procedure Laterality Date  . CARDIAC CATHETERIZATION N/A 09/23/2015   Procedure: Right and Left Heart Cath;  Surgeon: Minna Merritts, MD;  Location: [place] [facility];  Service: Cardiovascular;  Laterality: N/A;  . CARDIAC CATHETERIZATION N/A 09/23/2015   Procedure: Coronary Stent Intervention;  Surgeon: Yolonda Kida, MD;  Location: [place] [facility];  Service: Cardiovascular;  Laterality: N/A;  . LUMBAR FUSION    . TOTAL HIP ARTHROPLASTY      Prior to Admission medications   Medication Sig Start Date End Date Taking? Authorizing Provider  aspirin EC 81 MG tablet Take 81 mg by mouth daily.   Yes Historical Provider, MD  cholecalciferol (VITAMIN D) 1000 units tablet Take 2,000 Units by mouth daily.   Yes Historical Provider, MD  diltiazem (CARDIZEM CD) 240 MG 24 hr capsule Take 480 mg by mouth daily.   Yes Historical Provider, MD  ferrous gluconate (FERGON) 324 MG tablet Take 324 mg by mouth 2 (two) times daily with a meal.    Yes Historical Provider, MD  furosemide (LASIX) 40 MG tablet Take 40 mg by mouth daily as needed for edema.    Yes Historical Provider, MD  glipiZIDE (GLUCOTROL) 10 MG tablet  Take 20 mg by mouth 2 (two) times daily before a meal.   Yes Historical Provider, MD  hydrochlorothiazide (HYDRODIURIL) 25 MG tablet Take 25 mg by mouth daily.   Yes Historical Provider, MD  metoprolol succinate (TOPROL-XL) 100 MG 24 hr tablet Take 100 mg by mouth daily. Take with or immediately following a meal.   Yes Historical Provider, MD  pioglitazone (ACTOS) 30 MG tablet Take 30 mg by mouth daily.   Yes Historical Provider, MD  potassium chloride (K-DUR,KLOR-CON) 10 MEQ tablet Take 10 mEq by mouth as needed (when  taking Lasix).    Yes Historical Provider, MD  pravastatin (PRAVACHOL) 80 MG tablet Take 80 mg by mouth daily.    Yes Historical Provider, MD  sitaGLIPtin (JANUVIA) 50 MG tablet Take 50 mg by mouth daily.   Yes Historical Provider, MD  ticagrelor (BRILINTA) 90 MG TABS tablet Take 1 tablet (90 mg total) by mouth 2 (two) times daily. 12/22/15  Yes Wende Bushy, MD  valsartan (DIOVAN) 320 MG tablet Take 320 mg by mouth daily.   Yes Historical Provider, MD  ondansetron (ZOFRAN ODT) 4 MG disintegrating tablet Take 1 tablet (4 mg total) by mouth every 6 (six) hours as needed for nausea or vomiting. 02/01/16   Delman Kitten, MD    Allergies Ferrous sulfate; Keflex [cephalexin]; Penicillins; Percocet [oxycodone-acetaminophen]; Vicodin [hydrocodone-acetaminophen]; and Ace inhibitors  Family History  Problem Relation Age of Onset  . Heart disease Father   . Diabetes Mother   . Hypertension Mother     Social History Social History  Substance Use Topics  . Smoking status: Never Smoker  . Smokeless tobacco: Never Used  . Alcohol use No    Review of Systems Constitutional: No fever/chills Eyes: No visual changes. ENT: No sore throat. Cardiovascular: Denies chest pain. Respiratory: Denies shortness of breath. Gastrointestinal:  No constipation. Genitourinary: Negative for dysuria. Musculoskeletal: Negative for back pain. Skin: Negative for rash. Neurological: Negative for headaches, focal weakness or numbness.  10-point ROS otherwise negative.  ____________________________________________   PHYSICAL EXAM:  VITAL SIGNS: ED Triage Vitals  Enc Vitals Group     BP 02/01/16 0700 (!) 164/51     Pulse Rate 02/01/16 0700 76     Resp 02/01/16 0700 20     Temp 02/01/16 0700 97.7 F (36.5 C)     Temp Source 02/01/16 0700 Oral     SpO2 02/01/16 0700 96 %     Weight 02/01/16 0700 257 lb (116.6 kg)     Height 02/01/16 0700 5\' 4"  (1.626 m)     Head Circumference --      Peak Flow --      Pain  Score 02/01/16 0701 8     Pain Loc --      Pain Edu? --      Excl. in [place]? --     Constitutional: Alert and oriented. Well appearing and in no acute distress.pleasant, here with her son. Eyes: Conjunctivae are normal. PERRL. EOMI. Head: Atraumatic. Nose: No congestion/rhinnorhea. Mouth/Throat: Mucous membranes are moist.  Oropharynx non-erythematous. Neck: No stridor.   Cardiovascular: Normal rate, regular rhythm. Grossly normal heart sounds.  Good peripheral circulation. Respiratory: Normal respiratory effort.  No retractions. Lungs CTAB. Gastrointestinal: Soft. Mild tenderness in the middle of the abdomen, peri umbilical he without evidence of rebound or guarding.No distention. No abdominal bruits. No CVA tenderness.negative Murphy. No focal pain at McBurney's point.No hernia noted Musculoskeletal: No lower extremity tenderness nor edema.  No joint effusions. Neurologic:  Normal speech and language. No gross focal neurologic deficits are appreciated.  Skin:  Skin is warm, dry and intact. No rash noted. Psychiatric: Mood and affect are normal. Speech and behavior are normal.  ____________________________________________   LABS (all labs ordered are listed, but only abnormal results are displayed)  Labs Reviewed  COMPREHENSIVE METABOLIC PANEL - Abnormal; Notable for the following:       Result Value   Glucose, Bld 293 (*)    BUN 32 (*)    Creatinine, Ser 1.83 (*)    Total Protein 8.6 (*)    ALT 12 (*)    GFR calc non Af Amer 26 (*)    GFR calc Af Amer 30 (*)    All other components within normal limits  CBC - Abnormal; Notable for the following:    WBC 11.8 (*)    All other components within normal limits  URINALYSIS COMPLETEWITH MICROSCOPIC ([facility] ONLY) - Abnormal; Notable for the following:    Color, Urine YELLOW (*)    APPearance CLEAR (*)    Glucose, UA >500 (*)    Bacteria, UA RARE (*)    Squamous Epithelial / LPF 0-5 (*)    All other components within normal limits    LIPASE, BLOOD  TROPONIN I   ____________________________________________  EKG  Reviewed and interpreted by me at 702 Normal sinus rhythm, bifascicular block including right bundle and left anterior Fasco. No evidence of ischemic change noted. PR 180 QRS 1:30 QTc 480 ____________________________________________  RADIOLOGY  Ct Abdomen Pelvis Wo Contrast  Result Date: 02/01/2016 CLINICAL DATA:  Right upper quadrant pain with nausea, vomiting, and diarrhea. Symptoms since last night. EXAM: CT ABDOMEN AND PELVIS WITHOUT CONTRAST TECHNIQUE: Multidetector CT imaging of the abdomen and pelvis was performed following the standard protocol without IV contrast. COMPARISON:  None. FINDINGS: There are ill-defined ground-glass opacities at both posterior lung bases. There is borderline bronchiolectasis at the right lung base. Large 2.5 cm solitary gallstone. No obvious inflammatory changes of the gallbladder Liver, spleen, pancreas, adrenal glands are within normal limits Atrophic changes of the kidneys. There is a nonspecific 1.3 cm hyperdense lesion in the lower pole of the left kidney. Normal appendix. No obvious mass involving the colon. No evidence of small-bowel obstruction Bladder is decompressed. Uterine calcifications likely represent fibroids. Adnexa are unremarkable No free-fluid.  No abnormal retroperitoneal adenopathy. L5-S1 fusion. Advanced degenerative disc disease at L3-4. No vertebral compression deformity. Right total hip arthroplasty is in place. IMPRESSION: Cholelithiasis. Ground-glass opacities at the lung bases likely represent volume loss. Inflammatory process is not excluded. 1.3 cm lesion in the left kidney.  MRI is recommended. Electronically Signed   By: Marybelle Killings M.D.   On: 02/01/2016 14:01    ____________________________________________   PROCEDURES  Procedure(s) performed: None  Procedures  Critical Care performed:  No  ____________________________________________   INITIAL IMPRESSION / ASSESSMENT AND PLAN / ED COURSE  Pertinent labs & imaging results that were available during my care of the patient were reviewed by me and considered in my medical decision making (see chart for details).  Differential diagnosis includes but is not limited to, abdominal perforation, aortic dissection, cholecystitis, appendicitis, diverticulitis, colitis, esophagitis/gastritis, kidney stone, pyelonephritis, urinary tract infection, aortic aneurysm. All are considered in decision and treatment plan. Based upon the patient's presentation and risk factors, Will proceed with CT of the abdomen without contrast to evaluate for etiologies such as bowel obstruction, diverticulitis, appendicitis, or other acute disease. Overall it is reassuring that her  symptoms have improved, she is awake and alert and reports that the pain is much better.   Clinical Course     ____________________________________________   FINAL CLINICAL IMPRESSION(S) / ED DIAGNOSES  Final diagnoses:  Calculus of gallbladder without cholecystitis without obstruction   ----------------------------------------- 2:40 PM on 02/01/2016 -----------------------------------------  Patient reports all her pain and symptoms are improved. On repeat abdominal exam no discomfort, no epigastric or right upper quadrant abdominal pain. CT shows gallstones, and I am suspicious she may have had a episode of biliary colic. I discussed with the patient and her son follow up with general surgery, and careful return precautions. They're both very agreeable with the plan.   NEW MEDICATIONS STARTED DURING THIS VISIT:  New Prescriptions   ONDANSETRON (ZOFRAN ODT) 4 MG DISINTEGRATING TABLET    Take 1 tablet (4 mg total) by mouth every 6 (six) hours as needed for nausea or vomiting.     Note:  This document was prepared using Dragon voice recognition software and may  include unintentional dictation errors.     Delman Kitten, MD 02/01/16 [phone]

## 2016-02-15 ENCOUNTER — Telehealth: Payer: Self-pay | Admitting: Cardiology

## 2016-02-15 NOTE — Telephone Encounter (Signed)
Request for surgical clearance:  1. What type of surgery is being performed?  Lap Chole 2. When is this surgery scheduled?  Not yet  3. Are there any medications that need to be held prior to surgery and how long?  Brilinta and Asprin  for a week  4. Name of physician performing surgery?   Dr Rochel Brome 5. What is your office phone and fax number?  Fax [phone]

## 2016-02-15 NOTE — Telephone Encounter (Signed)
Sherri from [facility] called for surgical clearance. (see information below).  Forward to Dr. Yvone Neu to to review and advise.

## 2016-02-17 NOTE — Telephone Encounter (Signed)
Let Melissa Christian at [facility] know that Dr. Yvone Neu will review when she returns next week. She verbalized understanding and had no further questions.

## 2016-02-22 ENCOUNTER — Encounter
Admission: RE | Admit: 2016-02-22 | Discharge: 2016-02-22 | Disposition: A | Discharge status: Home / Self Care | Payer: Medicare Other | Source: Ambulatory Visit | Attending: Surgery | Admitting: Surgery

## 2016-02-22 DIAGNOSIS — K8010 Calculus of gallbladder with chronic cholecystitis without obstruction: Secondary | ICD-10-CM | POA: Insufficient documentation

## 2016-02-22 DIAGNOSIS — Z01818 Encounter for other preprocedural examination: Secondary | ICD-10-CM | POA: Insufficient documentation

## 2016-02-22 HISTORY — DX: Other specified postprocedural states: R11.2

## 2016-02-22 HISTORY — DX: Other specified postprocedural states: Z98.890

## 2016-02-22 NOTE — Patient Instructions (Signed)
  Your procedure is scheduled [id number] Sept 1, 2017. Report to Same Day Surgery. To find out your arrival time please call [phone] between 1PM - 3PM on Thursday March 03, 2016.  Remember: Instructions that are not followed completely may result in serious medical risk, up to and including death, or upon the discretion of your surgeon and anesthesiologist your surgery may need to be rescheduled.    _x___ 1. Do not eat food or drink liquids after midnight. No gum chewing or hard candies.     ____ 2. No Alcohol for 24 hours before or after surgery.   ____ 3. Bring all medications with you on the day of surgery if instructed.    __x__ 4. Notify your doctor if there is any change in your medical condition     (cold, fever, infections).     Do not wear jewelry, make-up, hairpins, clips or nail polish.  Do not wear lotions, powders, or perfumes. You may wear deodorant.  Do not shave 48 hours prior to surgery. Men may shave face and neck.  Do not bring valuables to the hospital.    [facility] is not responsible for any belongings or valuables.               Contacts, dentures or bridgework may not be worn into surgery.  Leave your suitcase in the car. After surgery it may be brought to your room.  For patients admitted to the hospital, discharge time is determined by your treatment team.   Patients discharged the day of surgery will not be allowed to drive home.    Please read over the following fact sheets that you were given:   [facility] Preparing for Surgery  _x___ Take these medicines the morning of surgery with A SIP OF WATER:    1. diltiazem (CARDIZEM CD)   2. metoprolol succinate (TOPROL-XL)  3. valsartan (DIOVAN)   ____ Fleet Enema (as directed)   _x___ Use CHG Soap as directed on instruction sheet  ____ Use inhalers on the day of surgery and bring to hospital day of surgery  ____ Stop metformin 2 days prior to surgery    ____ Take 1/2 of usual insulin dose  the night before surgery and none on the morning of  surgery.   __x__ Dr. Thompson Caul office to call and instruct pt when to stop ticagrelor [facility])  And aspirin.  __x__ Stop Anti-inflammatories such as Advil, Aleve, Ibuprofen, Motrin, Naproxen,  Naprosyn, Goodies powders or aspirin products. OK to take Tylenol.   ____ Stop supplements until after surgery.    ____ Bring C-Pap to the hospital.

## 2016-02-23 NOTE — Telephone Encounter (Signed)
Patient had NSTEMI and drug-eluting stent placed in March 2017. Ideally, dual antiplatelet therapy for one year. If surgery is necessary, will recommend to at least wait 6 months from stent placement before withholding Brilinta and aspirin. We'll recommend to resume dual antiplatelet therapy once okay post surgery.

## 2016-02-23 NOTE — Telephone Encounter (Signed)
Spoke with Sherri at [facility] and forwarded note to Dr. Tamala Julian. Let her know to please call back if I can assist in anyway.

## 2016-02-24 ENCOUNTER — Telehealth: Payer: Self-pay | Admitting: Cardiology

## 2016-02-24 NOTE — Telephone Encounter (Signed)
Strictly speaking, NSTEMI occurred 09/23/2015. 6 months from that time would be 03/25/2016. Ideally as mentioned prior, one year of dual antiplatelet therapy is recommended. If surgery is needed prior to that, we will need to wait at least 6 months from start of dual antiplatelet therapy to reduce risk of in stent thrombosis resulting in major complication such as an MI. That makes that 03/25/2016. If patient will like to discuss this in person, would be happy to see her in the office. Thank you

## 2016-02-24 NOTE — Telephone Encounter (Signed)
Faxed recommendations by Dr. Yvone Neu to Dr. Thompson Caul office. Original placed in "Fax" bin on Pamela's desk.

## 2016-02-24 NOTE — Telephone Encounter (Signed)
Faxed Dr. Tora Kindred note on telephone encounter to Dr. Tamala Julian at [phone]. Placed fax in "Fax" bin on Tyreque Finken's desk.

## 2016-02-24 NOTE — Telephone Encounter (Signed)
Sherri from [facility] called and needs specific instructions on holding Brilinta prior to surgery. Patient's surgery is scheduled for March 04, 2016 and that is roughly 6 months. Will forward to Dr. Yvone Neu for her recommendations.

## 2016-02-24 NOTE — Telephone Encounter (Signed)
As discussed previously, it is our recommendation that dual antiplatelet therapy be continued for at least 6 months from 09/23/2015 to reduce the likelihood of in-stent thrombosis. In terms of holding the Brilinta prior to the surgery, it is recommended to hold it at least 5 days prior to surgery. If aspirin 81 mg by mouth daily can be continued, that is preferred. Resume dual antiplatelet therapy as soon as okay with surgery.

## 2016-02-26 ENCOUNTER — Other Ambulatory Visit: Payer: Self-pay | Admitting: Internal Medicine

## 2016-02-26 DIAGNOSIS — N289 Disorder of kidney and ureter, unspecified: Secondary | ICD-10-CM

## 2016-02-29 NOTE — Pre-Procedure Instructions (Signed)
Per Charlena Cross at Dr. Thompson Caul office, pt is getting a clearance for surgery, will be seen back in their office later in September and surgery will berescheduled after that appt.

## 2016-03-04 ENCOUNTER — Ambulatory Visit: Admission: RE | Admit: 2016-03-04 | Payer: Medicare Other | Source: Ambulatory Visit | Admitting: Surgery

## 2016-03-04 ENCOUNTER — Encounter: Admission: RE | Payer: Self-pay | Source: Ambulatory Visit

## 2016-03-04 SURGERY — LAPAROSCOPIC CHOLECYSTECTOMY WITH INTRAOPERATIVE CHOLANGIOGRAM
Anesthesia: Choice

## 2016-03-09 ENCOUNTER — Ambulatory Visit
Admission: RE | Admit: 2016-03-09 | Discharge: 2016-03-09 | Disposition: A | Discharge status: Home / Self Care | Payer: Medicare Other | Source: Ambulatory Visit | Attending: Internal Medicine | Admitting: Internal Medicine

## 2016-03-09 DIAGNOSIS — N289 Disorder of kidney and ureter, unspecified: Secondary | ICD-10-CM | POA: Insufficient documentation

## 2016-03-09 DIAGNOSIS — K8020 Calculus of gallbladder without cholecystitis without obstruction: Secondary | ICD-10-CM | POA: Diagnosis not present

## 2016-04-05 ENCOUNTER — Other Ambulatory Visit: Payer: Medicare Other

## 2016-04-06 ENCOUNTER — Other Ambulatory Visit: Payer: Medicare Other

## 2016-04-07 ENCOUNTER — Encounter: Payer: Medicare Other | Admitting: Pharmacist

## 2016-04-07 ENCOUNTER — Encounter (INDEPENDENT_AMBULATORY_CARE_PROVIDER_SITE_OTHER): Payer: Self-pay

## 2016-04-07 ENCOUNTER — Ambulatory Visit: Payer: Medicare Other | Admitting: Pharmacy Technician

## 2016-04-07 DIAGNOSIS — Z79899 Other long term (current) drug therapy: Secondary | ICD-10-CM

## 2016-04-07 NOTE — Progress Notes (Signed)
Completed [facility] application and contract.  Patient agreed to all terms of the [facility] contract.  Patient to provide current utility bill, last 30 days of checking account statement and EOB from insurance company indicating that they are in the coverage gap.  Provided patient with information about [employer] to assist with utility bills.  Also, provide patient with other community resource material based on her particular needs.    Patient to come in for MTM at [facility].  After the pharmacy consult, Pharmacist will request prescriptions from Dr. Paticia Stack.   Upon receipt of prescriptions, Brilinta PAP application will be completed.  [facility]

## 2016-04-12 ENCOUNTER — Ambulatory Visit: Admit: 2016-04-12 | Payer: Medicare Other | Admitting: Surgery

## 2016-04-12 SURGERY — LAPAROSCOPIC CHOLECYSTECTOMY WITH INTRAOPERATIVE CHOLANGIOGRAM
Anesthesia: Choice

## 2016-05-09 ENCOUNTER — Ambulatory Visit: Payer: Medicare Other | Admitting: Pharmacist

## 2016-05-09 ENCOUNTER — Encounter: Payer: Self-pay | Admitting: Pharmacist

## 2016-05-09 DIAGNOSIS — Z79899 Other long term (current) drug therapy: Secondary | ICD-10-CM | POA: Insufficient documentation

## 2016-05-09 NOTE — Progress Notes (Addendum)
[facility] Visit Note  Patient: Melissa Christian MRN: [id number] Date of Birth: 1940-08-15 PCP: Glendon Axe, MD   Melissa Christian 75 y.o. female presents for an MTM visit today.  BP (!) 164/75 (Cuff Size: Large)   Pulse 65   Ht 5\' 4"  (1.626 m)   Wt 246 lb (111.6 kg)   BMI 42.23 kg/m   Patient Information   Past Medical History:  Diagnosis Date  . Anemia   . CAD (coronary artery disease)    a. NSTEMI 09/2015: cardiac cath: mLAD 95% s/p PCI/DES 0%, LCx no obs, RCA no obs, LVEF 55-65%  . Chronic diastolic CHF (congestive heart failure) ([place])   . Diabetes mellitus without complication ([place])   . High cholesterol   . Hypertension   . Low blood potassium   . PONV (postoperative nausea and vomiting)       Past Surgical History:  Procedure Laterality Date  . BACK SURGERY    . CARDIAC CATHETERIZATION N/A 09/23/2015   Procedure: Right and Left Heart Cath;  Surgeon: Minna Merritts, MD;  Location: [facility];  Service: Cardiovascular;  Laterality: N/A;  . CARDIAC CATHETERIZATION N/A 09/23/2015   Procedure: Coronary Stent Intervention;  Surgeon: Yolonda Kida, MD;  Location: [facility];  Service: Cardiovascular;  Laterality: N/A;  . JOINT REPLACEMENT    . LUMBAR FUSION    . TOTAL HIP ARTHROPLASTY       Family History  Problem Relation Age of Onset  . Heart disease Father   . Diabetes Mother   . Hypertension Mother     New Diagnoses (since last visit): gallstones, kidney nodule   Family Support: Good  Lifestyle Diet: Breakfast: applesauce, cereal Lunch: Salad, soup with crackers  Dinner: grilled chicken, cream potatoes, green beans, peas, carrots  Drinks: coffee with cream, water     Current Exercise Habits: Structured exercise class, Type of exercise: strength training/weights, Time (Minutes): 45, Frequency (Times/Week): 3, Weekly Exercise (Minutes/Week): 135, Intensity: Mild      History  Alcohol Use No      History   Smoking Status  . Never Smoker  Smokeless Tobacco  . Never Used      Health Maintenance  Topic Date Due  . FOOT EXAM  12/19/1949  . OPHTHALMOLOGY EXAM  12/19/1949  . TETANUS/TDAP  12/20/1958  . ZOSTAVAX  12/20/1999  . DEXA SCAN  12/19/2004  . PNA vac Low Risk Adult (1 of 2 - PCV13) 12/19/2004  . INFLUENZA VACCINE  02/02/2016  . HEMOGLOBIN A1C  03/25/2016   Prior to Admission medications   Medication Sig Start Date End Date Taking? Authorizing Provider  aspirin EC 81 MG tablet Take 81 mg by mouth daily.   Yes Historical Provider, MD  cholecalciferol (VITAMIN D) 1000 units tablet Take 2,000 Units by mouth daily.   Yes Historical Provider, MD  diltiazem (CARDIZEM CD) 240 MG 24 hr capsule Take 480 mg by mouth daily.   Yes Historical Provider, MD  ferrous gluconate (FERGON) 324 MG tablet Take 324 mg by mouth 2 (two) times daily with a meal.    Yes Historical Provider, MD  furosemide (LASIX) 40 MG tablet Take 40 mg by mouth daily as needed for edema.    Yes Historical Provider, MD  glipiZIDE (GLUCOTROL) 10 MG tablet Take 20 mg by mouth 2 (two) times daily before a meal.   Yes Historical Provider, MD  hydrochlorothiazide (HYDRODIURIL) 25 MG tablet Take 25 mg by mouth daily.  Yes Historical Provider, MD  metoprolol succinate (TOPROL-XL) 100 MG 24 hr tablet Take 100 mg by mouth daily. Take with or immediately following a meal.   Yes Historical Provider, MD  potassium chloride (K-DUR,KLOR-CON) 10 MEQ tablet Take 10 mEq by mouth as needed (when taking Lasix).    Yes Historical Provider, MD  pravastatin (PRAVACHOL) 80 MG tablet Take 80 mg by mouth every morning.    Yes Historical Provider, MD  ticagrelor (BRILINTA) 90 MG TABS tablet Take 1 tablet (90 mg total) by mouth 2 (two) times daily. 12/22/15  Yes Wende Bushy, MD  valsartan (DIOVAN) 320 MG tablet Take 320 mg by mouth every morning.    Yes Historical Provider, MD    Assessment and Plan: HTN: BP 164/75, elevated. Pt states BP normally  runs ~130/64. BP could have been elevated due to cuff. Informed patient to continue checking [facility] and to note BP trends. Pt on valsartan, metoprolol succinate, HCTZ, and diltiazem for BP. Talked to patient about getting low sodium soup and unsalted crackers as this maybe contributing to elevated BP.  Gallstones: On 02/22/16 patient had imaging studies taken which revealed gallstones and a kidney nodule. Pt had surgery scheduled twice, but MD cancelled surgery each time. Md cancelled the first surgery due to patient being on Brilinta and cancelled the second one due to the patients renal function. Pt has an appointment scheduled to see a nephrologist prior to scheduling her appointment for surgery. Currently she has no pain or complaints with the gallstones.  Type 2 DM: Last Hgb A1C 8.8%, uncontrolled. Pt has had new labs drawn and will find out the results at her next appointment. She reports checking her blood sugar only when her doctor tells her which is about once a month. She needs a new battery for her meter and potentially a new script for test strips.Informed the patient about the importance of checking blood sugar as too much blood sugar can affect her renal function. She is willing to check her sugar once a week. Counseled on the importance of diet, exercise, and medications to help decrease A1C. Pt currently taking glipizide.  NSTEMI: Patient had an NSTEMI back in March. She received a drug-eluting stent and is on dual antiplatelet therapy with Brilinta and Aspirin. She has had minor bruising with Brilinta, but is resolving. Patient also taking diltiazem and metoprolol.  Renal Function: Baseline SCr ~1.7. Last SCr in July was 1.83. Informed patient that the combination of having HTN and diabetes over a long period of time can affect renal function.  Anemia: controlled on ferrous gluconate.     Craig Guess [place], [facility] 4:26 PM 05/09/2016

## 2016-05-09 NOTE — Addendum Note (Signed)
Addended by: Loree Fee on: 05/09/2016 04:27 PM   Modules accepted: Orders

## 2016-05-10 ENCOUNTER — Ambulatory Visit: Payer: Self-pay

## 2016-05-23 ENCOUNTER — Encounter: Payer: Self-pay | Admitting: Urology

## 2016-05-23 ENCOUNTER — Ambulatory Visit: Payer: Medicare Other | Admitting: Urology

## 2016-05-23 VITALS — BP 121/79 | HR 60 | Ht 64.0 in | Wt 244.5 lb

## 2016-05-23 DIAGNOSIS — N281 Cyst of kidney, acquired: Secondary | ICD-10-CM | POA: Insufficient documentation

## 2016-05-23 DIAGNOSIS — N2889 Other specified disorders of kidney and ureter: Secondary | ICD-10-CM | POA: Diagnosis not present

## 2016-05-23 NOTE — Progress Notes (Addendum)
05/23/2016 9:48 AM   Melissa Christian 02/22/1941 [id number]  Referring provider: Glendon Axe, MD [facility] [place], [place] [address]  Chief Complaint  Patient presents with  . New Patient (Initial Visit)    Renal cyst     HPI: Ms Cushman is a 75yo seen today for a left renal mass. She underwent MRI abd which showed an 74m left renal mass likely representing a hemorrhagic cyst. The lesion was initially found on a CT scan obtained for right upper quadrant pain. She denies any left flank pain.  She has a new diagnosis of CKD with GFR 30-36.  She has DMII and her blood sugars have been poorly controlled. SHe has not been taking her new DMII meds due to cost.  She denies significant LUTS. Nocturia 1x. No hematuria   PMH: Past Medical History:  Diagnosis Date  . Anemia   . CAD (coronary artery disease)    a. NSTEMI 09/2015: cardiac cath: mLAD 95% s/p PCI/DES 0%, LCx no obs, RCA no obs, LVEF 55-65%  . Chronic diastolic CHF (congestive heart failure) ([place])   . Diabetes mellitus without complication ([place])   . High cholesterol   . Hypertension   . Low blood potassium   . PONV (postoperative nausea and vomiting)     Surgical History: Past Surgical History:  Procedure Laterality Date  . BACK SURGERY    . CARDIAC CATHETERIZATION N/A 09/23/2015   Procedure: Right and Left Heart Cath;  Surgeon: Minna Merritts, MD;  Location: [facility];  Service: Cardiovascular;  Laterality: N/A;  . CARDIAC CATHETERIZATION N/A 09/23/2015   Procedure: Coronary Stent Intervention;  Surgeon: Yolonda Kida, MD;  Location: [facility] [facility];  Service: Cardiovascular;  Laterality: N/A;  . JOINT REPLACEMENT    . LUMBAR FUSION    . TOTAL HIP ARTHROPLASTY      Home Medications:    Medication List       Accurate as of 05/23/16  9:48 AM. Always use your most recent med list.          aspirin EC 81 MG tablet Take 81 mg by mouth daily.     cholecalciferol 1000 units tablet Commonly known as:  VITAMIN D Take 2,000 Units by mouth daily.   diltiazem 240 MG 24 hr capsule Commonly known as:  CARDIZEM CD Take 480 mg by mouth daily.   ferrous gluconate 324 MG tablet Commonly known as:  FERGON Take 324 mg by mouth 2 (two) times daily with a meal.   furosemide 40 MG tablet Commonly known as:  LASIX Take 40 mg by mouth daily as needed for edema.   glipiZIDE 10 MG tablet Commonly known as:  GLUCOTROL Take 20 mg by mouth 2 (two) times daily before a meal.   hydrochlorothiazide 25 MG tablet Commonly known as:  HYDRODIURIL Take 25 mg by mouth daily.   metoprolol succinate 100 MG 24 hr tablet Commonly known as:  TOPROL-XL Take 100 mg by mouth daily. Take with or immediately following a meal.   potassium chloride 10 MEQ tablet Commonly known as:  K-DUR,KLOR-CON Take 10 mEq by mouth as needed (when taking Lasix).   pravastatin 80 MG tablet Commonly known as:  PRAVACHOL Take 80 mg by mouth every morning.   ticagrelor 90 MG Tabs tablet Commonly known as:  BRILINTA Take 1 tablet (90 mg total) by mouth 2 (two) times daily.   valsartan 320 MG tablet Commonly known as:  DIOVAN  Take 320 mg by mouth every morning.       Allergies:  Allergies  Allergen Reactions  . Ferrous Sulfate Itching  . Keflex [Cephalexin] Itching  . Penicillins Nausea And Vomiting  . Percocet [Oxycodone-Acetaminophen] Itching  . Vicodin [Hydrocodone-Acetaminophen] Itching  . Ace Inhibitors Rash    elevated potassium at higher doses    Family History: Family History  Problem Relation Age of Onset  . Heart disease Father   . Diabetes Mother   . Hypertension Mother     Social History:  reports that she has never smoked. She has never used smokeless tobacco. She reports that she does not drink alcohol or use drugs.  ROS: UROLOGY Frequent Urination?: No Hard to postpone urination?: No Burning/pain with urination?: No Get up at night  to urinate?: Yes Leakage of urine?: No Urine stream starts and stops?: No Trouble starting stream?: No Do you have to strain to urinate?: No Blood in urine?: No Urinary tract infection?: No Sexually transmitted disease?: No Injury to kidneys or bladder?: No Painful intercourse?: No Weak stream?: No Currently pregnant?: No Vaginal bleeding?: No Last menstrual period?: 26 yrs ago  Gastrointestinal Nausea?: No Vomiting?: No Indigestion/heartburn?: No Diarrhea?: No Constipation?: No  Constitutional Fever: No Night sweats?: No Weight loss?: No Fatigue?: No  Skin Skin rash/lesions?: No Itching?: No  Eyes Blurred vision?: No Double vision?: No  Ears/Nose/Throat Sore throat?: No Sinus problems?: No  Hematologic/Lymphatic Swollen glands?: No Easy bruising?: Yes  Cardiovascular Leg swelling?: No Chest pain?: No  Respiratory Cough?: No Shortness of breath?: No  Endocrine Excessive thirst?: No  Musculoskeletal Back pain?: No Joint pain?: No  Neurological Headaches?: No Dizziness?: No  Psychologic Depression?: No Anxiety?: No  Physical Exam: BP 121/79   Pulse 60   Ht 5\' 4"  (1.626 m)   Wt 110.9 kg (244 lb 8 oz)   BMI 41.97 kg/m   Constitutional:  Alert and oriented, No acute distress. HEENT: [place] AT, moist mucus membranes.  Trachea midline, no masses. Cardiovascular: No clubbing, cyanosis, or edema. Respiratory: Normal respiratory effort, no increased work of breathing. GI: Abdomen is soft, nontender, nondistended, no abdominal masses GU: No CVA tenderness.  Skin: No rashes, bruises or suspicious lesions. Lymph: No cervical or inguinal adenopathy. Neurologic: Grossly intact, no focal deficits, moving all 4 extremities. Psychiatric: Normal mood and affect.  Laboratory Data: Lab Results  Component Value Date   WBC 11.8 (H) 02/01/2016   HGB 13.5 02/01/2016   HCT 40.2 02/01/2016   MCV 91.8 02/01/2016   PLT 278 02/01/2016    Lab Results   Component Value Date   CREATININE 1.83 (H) 02/01/2016    No results found for: PSA  No results found for: TESTOSTERONE  Lab Results  Component Value Date   HGBA1C 8.8 (H) 09/23/2015    Urinalysis    Component Value Date/Time   COLORURINE YELLOW (A) 02/01/2016 1047   APPEARANCEUR CLEAR (A) 02/01/2016 1047   LABSPEC 1.014 02/01/2016 1047   PHURINE 6.0 02/01/2016 1047   GLUCOSEU >500 (A) 02/01/2016 1047   HGBUR NEGATIVE 02/01/2016 1047   BILIRUBINUR NEGATIVE 02/01/2016 1047   KETONESUR NEGATIVE 02/01/2016 1047   PROTEINUR NEGATIVE 02/01/2016 1047   NITRITE NEGATIVE 02/01/2016 1047   LEUKOCYTESUR NEGATIVE 02/01/2016 1047    Pertinent Imaging: MRI abd wo contrast  Assessment & Plan:    1. Left renal mass, likely benign -We discussed the natural hx of small renal masses and the likelihood this represents a benign lesion. Renal US in  1 year. The patient can undergo laparoscopic cholecystectomy since we have agreed not to proceed with surgery for her renal mass. -Pt instructed to call her PCP to request a cheaper DMII medications.   There are no diagnoses linked to this encounter.  No Follow-up on file.  Nicolette Bang, MD  [facility] [address] [place], [place] [address] [phone]

## 2016-06-06 ENCOUNTER — Inpatient Hospital Stay: Admission: RE | Admit: 2016-06-06 | Payer: Medicare Other | Source: Ambulatory Visit

## 2016-06-07 ENCOUNTER — Encounter
Admission: RE | Admit: 2016-06-07 | Discharge: 2016-06-07 | Disposition: A | Discharge status: Home / Self Care | Payer: Medicare Other | Source: Ambulatory Visit | Attending: Surgery | Admitting: Surgery

## 2016-06-07 DIAGNOSIS — K8010 Calculus of gallbladder with chronic cholecystitis without obstruction: Secondary | ICD-10-CM | POA: Insufficient documentation

## 2016-06-07 DIAGNOSIS — Z01818 Encounter for other preprocedural examination: Secondary | ICD-10-CM | POA: Insufficient documentation

## 2016-06-07 HISTORY — DX: Non-ST elevation (NSTEMI) myocardial infarction: I21.4

## 2016-06-07 LAB — DIFFERENTIAL
Basophils Absolute: 0.1 10*3/uL (ref 0–0.1)
Basophils Relative: 1 %
EOS ABS: 0.4 10*3/uL (ref 0–0.7)
EOS PCT: 4 %
LYMPHS ABS: 1.1 10*3/uL (ref 1.0–3.6)
Lymphocytes Relative: 10 %
MONO ABS: 0.6 10*3/uL (ref 0.2–0.9)
MONOS PCT: 6 %
NEUTROS PCT: 79 %
Neutro Abs: 8.2 10*3/uL — ABNORMAL HIGH (ref 1.4–6.5)

## 2016-06-07 LAB — CBC
HCT: 35.3 % (ref 35.0–47.0)
HEMOGLOBIN: 11.7 g/dL — AB (ref 12.0–16.0)
MCH: 30.1 pg (ref 26.0–34.0)
MCHC: 33.2 g/dL (ref 32.0–36.0)
MCV: 90.8 fL (ref 80.0–100.0)
Platelets: 318 10*3/uL (ref 150–440)
RBC: 3.89 MIL/uL (ref 3.80–5.20)
RDW: 14.4 % (ref 11.5–14.5)
WBC: 10.4 10*3/uL (ref 3.6–11.0)

## 2016-06-07 LAB — POTASSIUM: POTASSIUM: 4.7 mmol/L (ref 3.5–5.1)

## 2016-06-07 NOTE — Patient Instructions (Signed)
Your procedure is scheduled on:  Thursday 06/16/16 Report to [facility]. 2ND FLOOR MEDICAL MALL ENTRANCE. To find out your arrival time please call [phone] between 1PM - 3PM on Wednesday 06/15/16.  Remember: Instructions that are not followed completely may result in serious medical risk, up to and including death, or upon the discretion of your surgeon and anesthesiologist your surgery may need to be rescheduled.    __X__ 1. Do not eat food or drink liquids after midnight. No gum chewing or hard candies.     __X__ 2. No Alcohol for 24 hours before or after surgery.   ____ 3. Bring all medications with you on the day of surgery if instructed.    __X__ 4. Notify your doctor if there is any change in your medical condition     (cold, fever, infections).             ___X__5. No smoking within 24 hours of your surgery.     Do not wear jewelry, make-up, hairpins, clips or nail polish.  Do not wear lotions, powders, or perfumes.   Do not shave 48 hours prior to surgery. Men may shave face and neck.  Do not bring valuables to the hospital.    [facility] is not responsible for any belongings or valuables.               Contacts, dentures or bridgework may not be worn into surgery.  Leave your suitcase in the car. After surgery it may be brought to your room.  For patients admitted to the hospital, discharge time is determined by your                treatment team.   Patients discharged the day of surgery will not be allowed to drive home.   Please read over the following fact sheets that you were given:   Pain Booklet and MRSA Information   __X__ Take these medicines the morning of surgery with A SIP OF WATER:    1. DILTIAZEM  2. METOPROLOL  3. PRAVASTATIN  4. VALSARTAN  5.  6.  ____ Fleet Enema (as directed)   __X__ Use CHG Soap as directed  ____ Use inhalers on the day of surgery  ____ Stop metformin 2 days prior to surgery    ____ Take 1/2 of usual insulin dose the  night before surgery and none on the morning of surgery.   __X__ Stop BRILINTA 5 DAYS BEFORE SURGERY BUT CONTINUE BABY ASPIRIN*  __X__ Stop Anti-inflammatories such as Advil, Aleve, Ibuprofen, Motrin, Naproxen, Naprosyn, Goodies,powder, or aspirin products.  OK to take Tylenol.   ____ Stop supplements until after surgery.    ____ Bring C-Pap to the hospital.

## 2016-06-16 ENCOUNTER — Encounter: Payer: Self-pay | Admitting: Anesthesiology

## 2016-06-16 ENCOUNTER — Ambulatory Visit: Payer: Medicare Other

## 2016-06-16 ENCOUNTER — Ambulatory Visit
Admission: RE | Admit: 2016-06-16 | Discharge: 2016-06-16 | Disposition: A | Discharge status: Home / Self Care | Payer: Medicare Other | Source: Ambulatory Visit | Attending: Surgery | Admitting: Surgery

## 2016-06-16 ENCOUNTER — Encounter
Admission: RE | Disposition: A | Discharge status: Home / Self Care | Payer: Self-pay | Source: Ambulatory Visit | Attending: Surgery

## 2016-06-16 ENCOUNTER — Ambulatory Visit: Payer: Medicare Other | Admitting: Certified Registered Nurse Anesthetist

## 2016-06-16 DIAGNOSIS — I252 Old myocardial infarction: Secondary | ICD-10-CM | POA: Insufficient documentation

## 2016-06-16 DIAGNOSIS — Z955 Presence of coronary angioplasty implant and graft: Secondary | ICD-10-CM | POA: Diagnosis not present

## 2016-06-16 DIAGNOSIS — K8010 Calculus of gallbladder with chronic cholecystitis without obstruction: Secondary | ICD-10-CM | POA: Diagnosis present

## 2016-06-16 DIAGNOSIS — K8020 Calculus of gallbladder without cholecystitis without obstruction: Secondary | ICD-10-CM

## 2016-06-16 DIAGNOSIS — I2510 Atherosclerotic heart disease of native coronary artery without angina pectoris: Secondary | ICD-10-CM | POA: Diagnosis not present

## 2016-06-16 DIAGNOSIS — Z6841 Body Mass Index (BMI) 40.0 and over, adult: Secondary | ICD-10-CM | POA: Diagnosis not present

## 2016-06-16 DIAGNOSIS — I110 Hypertensive heart disease with heart failure: Secondary | ICD-10-CM | POA: Insufficient documentation

## 2016-06-16 DIAGNOSIS — I509 Heart failure, unspecified: Secondary | ICD-10-CM | POA: Insufficient documentation

## 2016-06-16 HISTORY — PX: CHOLECYSTECTOMY: SHX55

## 2016-06-16 LAB — GLUCOSE, CAPILLARY
GLUCOSE-CAPILLARY: 159 mg/dL — AB (ref 65–99)
Glucose-Capillary: 233 mg/dL — ABNORMAL HIGH (ref 65–99)

## 2016-06-16 SURGERY — LAPAROSCOPIC CHOLECYSTECTOMY WITH INTRAOPERATIVE CHOLANGIOGRAM
Anesthesia: General | Wound class: Clean Contaminated

## 2016-06-16 MED ORDER — DEXAMETHASONE SODIUM PHOSPHATE 10 MG/ML IJ SOLN
INTRAMUSCULAR | Status: DC | PRN
  Administered 2016-06-16: 8 mg via INTRAVENOUS

## 2016-06-16 MED ORDER — HEPARIN SODIUM (PORCINE) 5000 UNIT/ML IJ SOLN
INTRAMUSCULAR | Status: AC
  Filled 2016-06-16: qty 1

## 2016-06-16 MED ORDER — PROPOFOL 10 MG/ML IV BOLUS
INTRAVENOUS | Status: DC | PRN
  Administered 2016-06-16: 100 mg via INTRAVENOUS

## 2016-06-16 MED ORDER — PHENYLEPHRINE HCL 10 MG/ML IJ SOLN
INTRAMUSCULAR | Status: DC | PRN
  Administered 2016-06-16: 100 ug via INTRAVENOUS

## 2016-06-16 MED ORDER — ONDANSETRON HCL 4 MG/2ML IJ SOLN
INTRAMUSCULAR | Status: DC | PRN
  Administered 2016-06-16: 4 mg via INTRAVENOUS

## 2016-06-16 MED ORDER — MIDAZOLAM HCL 2 MG/2ML IJ SOLN
INTRAMUSCULAR | Status: DC | PRN
  Administered 2016-06-16 (×2): 1 mg via INTRAVENOUS

## 2016-06-16 MED ORDER — SCOPOLAMINE 1 MG/3DAYS TD PT72
MEDICATED_PATCH | TRANSDERMAL | Status: AC
  Administered 2016-06-16: 1.5 mg via TRANSDERMAL
  Filled 2016-06-16: qty 1

## 2016-06-16 MED ORDER — EPHEDRINE SULFATE 50 MG/ML IJ SOLN
INTRAMUSCULAR | Status: DC | PRN
  Administered 2016-06-16 (×3): 10 mg via INTRAVENOUS

## 2016-06-16 MED ORDER — SODIUM CHLORIDE 0.9 % IV SOLN
INTRAVENOUS | Status: DC | PRN
  Administered 2016-06-16: 300 mL via INTRAMUSCULAR

## 2016-06-16 MED ORDER — SODIUM CHLORIDE 0.9 % IV SOLN
INTRAVENOUS | Status: DC | PRN
  Administered 2016-06-16: 5 mL

## 2016-06-16 MED ORDER — PROMETHAZINE HCL 25 MG/ML IJ SOLN: 6.2500 mg | INTRAMUSCULAR | Status: DC | PRN

## 2016-06-16 MED ORDER — FENTANYL CITRATE (PF) 100 MCG/2ML IJ SOLN
INTRAMUSCULAR | Status: DC | PRN
  Administered 2016-06-16 (×3): 50 ug via INTRAVENOUS
  Administered 2016-06-16 (×2): 25 ug via INTRAVENOUS

## 2016-06-16 MED ORDER — FENTANYL CITRATE (PF) 100 MCG/2ML IJ SOLN
INTRAMUSCULAR | Status: AC
  Administered 2016-06-16: 25 ug via INTRAVENOUS
  Filled 2016-06-16: qty 2

## 2016-06-16 MED ORDER — FAMOTIDINE 20 MG PO TABS
ORAL_TABLET | ORAL | Status: AC
  Administered 2016-06-16: 20 mg via ORAL
  Filled 2016-06-16: qty 1

## 2016-06-16 MED ORDER — SODIUM CHLORIDE 0.9 % IV SOLN
INTRAVENOUS | Status: DC
  Administered 2016-06-16: 09:00:00 via INTRAVENOUS

## 2016-06-16 MED ORDER — FAMOTIDINE 20 MG PO TABS
20.0000 mg | ORAL_TABLET | Freq: Once | ORAL | Status: AC
  Administered 2016-06-16: 20 mg via ORAL

## 2016-06-16 MED ORDER — SUGAMMADEX SODIUM 200 MG/2ML IV SOLN
INTRAVENOUS | Status: DC | PRN
  Administered 2016-06-16: 200 mg via INTRAVENOUS

## 2016-06-16 MED ORDER — HYDROCODONE-ACETAMINOPHEN 5-325 MG PO TABS: 1.0000 | ORAL_TABLET | ORAL | Status: DC | PRN

## 2016-06-16 MED ORDER — HYDROCODONE-ACETAMINOPHEN 5-325 MG PO TABS: 1.0000 | ORAL_TABLET | ORAL | 0 refills | Status: DC | PRN

## 2016-06-16 MED ORDER — LIDOCAINE HCL (CARDIAC) 20 MG/ML IV SOLN
INTRAVENOUS | Status: DC | PRN
  Administered 2016-06-16: 50 mg via INTRAVENOUS

## 2016-06-16 MED ORDER — ROCURONIUM BROMIDE 100 MG/10ML IV SOLN
INTRAVENOUS | Status: DC | PRN
  Administered 2016-06-16 (×3): 10 mg via INTRAVENOUS
  Administered 2016-06-16: 30 mg via INTRAVENOUS
  Administered 2016-06-16: 10 mg via INTRAVENOUS

## 2016-06-16 MED ORDER — GLYCOPYRROLATE 0.2 MG/ML IJ SOLN
INTRAMUSCULAR | Status: DC | PRN
  Administered 2016-06-16: 0.2 mg via INTRAVENOUS

## 2016-06-16 MED ORDER — SCOPOLAMINE 1 MG/3DAYS TD PT72
1.0000 | MEDICATED_PATCH | Freq: Once | TRANSDERMAL | Status: DC
  Administered 2016-06-16: 1.5 mg via TRANSDERMAL

## 2016-06-16 MED ORDER — FENTANYL CITRATE (PF) 100 MCG/2ML IJ SOLN
25.0000 ug | INTRAMUSCULAR | Status: DC | PRN
  Administered 2016-06-16 (×2): 25 ug via INTRAVENOUS

## 2016-06-16 SURGICAL SUPPLY — 38 items
APPLIER CLIP ROT 10 11.4 M/L (STAPLE) ×3
CANISTER SUCT 1200ML W/VALVE (MISCELLANEOUS) ×3 IMPLANT
CANNULA DILATOR 10 W/SLV (CANNULA) ×2 IMPLANT
CANNULA DILATOR 10MM W/SLV (CANNULA) ×1
CATH REDDICK CHOLANGI 4FR 50CM (CATHETERS) ×3 IMPLANT
CHLORAPREP W/TINT 26ML (MISCELLANEOUS) ×3 IMPLANT
CLIP APPLIE ROT 10 11.4 M/L (STAPLE) ×1 IMPLANT
CLOSURE WOUND 1/2 X4 (GAUZE/BANDAGES/DRESSINGS) ×1
DRAPE SHEET LG 3/4 BI-LAMINATE (DRAPES) ×3 IMPLANT
ELECT REM PT RETURN 9FT ADLT (ELECTROSURGICAL) ×3
ELECTRODE REM PT RTRN 9FT ADLT (ELECTROSURGICAL) ×1 IMPLANT
GAUZE SPONGE 4X4 12PLY STRL (GAUZE/BANDAGES/DRESSINGS) ×3 IMPLANT
GLOVE BIO SURGEON STRL SZ7.5 (GLOVE) ×18 IMPLANT
GOWN STRL REUS W/ TWL LRG LVL3 (GOWN DISPOSABLE) ×3 IMPLANT
GOWN STRL REUS W/TWL LRG LVL3 (GOWN DISPOSABLE) ×6
IRRIGATION STRYKERFLOW (MISCELLANEOUS) ×1 IMPLANT
IRRIGATOR STRYKERFLOW (MISCELLANEOUS) ×3
IV NS 1000ML (IV SOLUTION) ×2
IV NS 1000ML BAXH (IV SOLUTION) ×1 IMPLANT
KIT RM TURNOVER STRD PROC AR (KITS) ×3 IMPLANT
LABEL OR SOLS (LABEL) ×3 IMPLANT
NDL INSUFF ACCESS 14 VERSASTEP (NEEDLE) ×3 IMPLANT
NEEDLE FILTER BLUNT 18X 1/2SAF (NEEDLE) ×2
NEEDLE FILTER BLUNT 18X1 1/2 (NEEDLE) ×1 IMPLANT
NS IRRIG 500ML POUR BTL (IV SOLUTION) ×3 IMPLANT
PACK LAP CHOLECYSTECTOMY (MISCELLANEOUS) ×3 IMPLANT
SCISSORS METZENBAUM CVD 33 (INSTRUMENTS) ×3 IMPLANT
SEAL FOR SCOPE WARMER C3101 (MISCELLANEOUS) IMPLANT
SLEEVE ENDOPATH XCEL 5M (ENDOMECHANICALS) ×3 IMPLANT
STRIP CLOSURE SKIN 1/2X4 (GAUZE/BANDAGES/DRESSINGS) ×2 IMPLANT
SUT CHROMIC 5 0 RB 1 27 (SUTURE) ×3 IMPLANT
SUT MAXON ABS #0 GS21 30IN (SUTURE) ×3 IMPLANT
SUT VIC AB 0 CT2 27 (SUTURE) IMPLANT
SYR 3ML LL SCALE MARK (SYRINGE) ×3 IMPLANT
TROCAR XCEL NON-BLD 11X100MML (ENDOMECHANICALS) ×3 IMPLANT
TROCAR XCEL NON-BLD 5MMX100MML (ENDOMECHANICALS) ×3 IMPLANT
TUBING INSUFFLATOR HI FLOW (MISCELLANEOUS) ×3 IMPLANT
WATER STERILE IRR 1000ML POUR (IV SOLUTION) IMPLANT

## 2016-06-16 NOTE — H&P (Signed)
  She reports no change in condition since the recent office visit.  She has stopped her Brilinta for 5 days. She does continue to take her aspirin.  Lab work reviewed  I discussed the plan for laparoscopic cholecystectomy.

## 2016-06-16 NOTE — Op Note (Signed)
OPERATIVE REPORT  PREOPERATIVE DIAGNOSIS:  Chronic cholecystitis cholelithiasis  POSTOPERATIVE DIAGNOSIS: Chronic cholecystitis cholelithiasis  PROCEDURE: Laparoscopic cholecystectomy with cholangiogram  ANESTHESIA: General  SURGEON: Rochel Brome M.D.  INDICATIONS: She has history of right upper quadrant abdominal pains and CT findings of the large gallstone. Surgery was recommended for definitive treatment.   With the patient on the operating table in the supine position under general endotracheal anesthesia the abdomen was prepared with ChloraPrep solution and draped in a sterile manner. A short incision was made in the inferior aspect of the umbilicus and carried down to the deep fascia which was grasped with a laryngeal hook. A Veress needle was inserted aspirated and irrigated with a saline solution. The peritoneal cavity was insufflated with carbon dioxide. The Veress needle was removed. The 10 mm cannula was inserted. The 10 mm 0 laparoscope was inserted to view the peritoneal cavity.  Another incision was made in the epigastrium slightly to the right of the midline to introduce an 11 mm cannula. 2 incisions were made in the lateral aspect of the right upper quadrant to introduce 2   5 mm cannulas. Initial inspection revealed a smooth surface of the liver and some thickening of the gallbladder wall. Brief survey of the intestines appeared typical.  The gallbladder was retracted towards the right shoulder.  The gallbladder neck was retracted inferiorly and laterally. Adhesions were dissected with hook and cautery. The porta hepatis was identified. There was a large stone impacted in the neck of the gallbladder. The gallbladder was mobilized with incision of the visceral peritoneum. The cystic duct was dissected free from surrounding structures and was large in size. During the course of dissection multiple tiny bleeding points are cauterized. The cystic artery was dissected free from  surrounding structures. A critical view of safety was demonstrated. The cystic artery was controlled with endoclips and divided. This allowed better traction on the cystic duct.  An Endo Clip was placed across the cystic duct adjacent to the gallbladder neck. An incision was made in the cystic duct to introduce a Reddick catheter. Several small stones were milked out of the cystic duct followed by insertion of the Reddick catheter. The cholangiogram was done with injection of half-strength Conray 60 dye. This demonstrated the bile ducts and flow of dye into the duodenum. No retained stones were identified. The cholangiogram appeared normal. The Reddick catheter was removed. The cystic duct was  ligated with 3 endoclips and divided.  The gallbladder was dissected free from the liver with use of hook and cautery and blunt dissection. Bleeding was minimal and hemostasis was intact. The gallbladder was delivered up through the infraumbilical incision opened and suctioned. There was a large amount of thick black bile which was aspirated with the suction tubing. There was a large stone which could not be cauterized. The skin incision below the umbilicus was lengthened and also the fascial defect was enlarged so that the gallbladder and stone were removed and submitted for routine pathology.  The skin incisions were closed with interrupted 5-0 chromic subcutaneous suture benzoin and Steri-Strips. Gauze dressings were applied with paper tape.  The patient appeared to be in satisfactory condition and was prepared for transfer to the recovery room. Estimated blood loss 10 mL.  . This operation took more than the usual amount of time greater than 2 hours and 15 minutes and additional effort due to the impaction of stone in the neck of the gallbladder and also the fact she has been taking aspirin  due to heart problems.  Rochel Brome M.D.

## 2016-06-16 NOTE — Anesthesia Preprocedure Evaluation (Signed)
Anesthesia Evaluation  Patient identified by MRN, date of birth, ID band Patient awake    Reviewed: Allergy & Precautions, H&P , NPO status , Patient's Chart, lab work & pertinent test results, reviewed documented beta blocker date and time   History of Anesthesia Complications (+) PONV and history of anesthetic complications  Airway Mallampati: II  TM Distance: >3 FB Neck ROM: full    Dental no notable dental hx. (+) Edentulous Upper, Edentulous Lower, Upper Dentures, Lower Dentures   Pulmonary neg pulmonary ROS,    Pulmonary exam normal breath sounds clear to auscultation       Cardiovascular Exercise Tolerance: Good hypertension, (-) angina+ CAD, + Past MI, + Cardiac Stents and +CHF  (-) CABG Normal cardiovascular exam(-) dysrhythmias (-) Valvular Problems/Murmurs Rhythm:regular Rate:Normal     Neuro/Psych negative neurological ROS  negative psych ROS   GI/Hepatic negative GI ROS, Neg liver ROS,   Endo/Other  diabetesMorbid obesity  Renal/GU CRFRenal disease  negative genitourinary   Musculoskeletal   Abdominal   Peds  Hematology negative hematology ROS (+)   Anesthesia Other Findings Past Medical History: No date: Anemia No date: CAD (coronary artery disease)     Comment: a. NSTEMI 09/2015: cardiac cath: mLAD 95% s/p               PCI/DES 0%, LCx no obs, RCA no obs, LVEF 55-65% No date: Chronic diastolic CHF (congestive heart failur* No date: Chronic kidney disease No date: Diabetes mellitus without complication (HCC) No date: High cholesterol No date: Hypertension No date: Low blood potassium No date: NSTEMI (non-ST elevated myocardial infarction)* No date: PONV (postoperative nausea and vomiting)   Reproductive/Obstetrics negative OB ROS                             Anesthesia Physical Anesthesia Plan  ASA: III  Anesthesia Plan: General   Post-op Pain Management:     Induction:   Airway Management Planned:   Additional Equipment:   Intra-op Plan:   Post-operative Plan:   Informed Consent: I have reviewed the patients History and Physical, chart, labs and discussed the procedure including the risks, benefits and alternatives for the proposed anesthesia with the patient or authorized representative who has indicated his/her understanding and acceptance.   Dental Advisory Given  Plan Discussed with: Anesthesiologist, CRNA and Surgeon  Anesthesia Plan Comments:         Anesthesia Quick Evaluation

## 2016-06-16 NOTE — Discharge Instructions (Signed)
AMBULATORY SURGERY  DISCHARGE INSTRUCTIONS   1) The drugs that you were given will stay in your system until tomorrow so for the next 24 hours you should not:  A) Drive an automobile B) Make any legal decisions C) Drink any alcoholic beverage   2) You may resume regular meals tomorrow.  Today it is better to start with liquids and gradually work up to solid foods.  You may eat anything you prefer, but it is better to start with liquids, then soup and crackers, and gradually work up to solid foods.   3) Please notify your doctor immediately if you have any unusual bleeding, trouble breathing, redness and pain at the surgery site, drainage, fever, or pain not relieved by medication. 4)   5) Your post-operative visit with Dr.                                     is: Date:                        Time:    Please call to schedule your post-operative visit.  6) Additional Instructions: Take Tylenol or Norco as needed for pain. 7) Should not drive or do anything dangerous when taking Norco. 8) Remove dressings on Saturday. May shower Sunday.

## 2016-06-16 NOTE — Transfer of Care (Signed)
Immediate Anesthesia Transfer of Care Note  Patient: Melissa Christian  Procedure(s) Performed: Procedure(s): LAPAROSCOPIC CHOLECYSTECTOMY WITH INTRAOPERATIVE CHOLANGIOGRAM (N/A)  Patient Location: PACU  Anesthesia Type:General  Level of Consciousness: responds to stimulation  Airway & Oxygen Therapy: Patient Spontanous Breathing and Patient connected to nasal cannula oxygen  Post-op Assessment: Report given to RN and Post -op Vital signs reviewed and stable  Post vital signs: Reviewed and stable  Last Vitals:  Vitals:   06/16/16 0923 06/16/16 1215  BP: (!) 151/84 (!) 129/57  Pulse: 73 74  Resp: 16 18  Temp: 36.4 C 36.7 C    Last Pain:  Vitals:   06/16/16 0923  TempSrc: Oral         Complications: No apparent anesthesia complications

## 2016-06-16 NOTE — Anesthesia Procedure Notes (Signed)
Procedure Name: Intubation Date/Time: 06/16/2016 9:35 AM Performed by: Darlyne Russian Pre-anesthesia Checklist: Emergency Drugs available, Patient identified, Suction available, Patient being monitored and Timeout performed Patient Re-evaluated:Patient Re-evaluated prior to inductionOxygen Delivery Method: Circle system utilized Preoxygenation: Pre-oxygenation with 100% oxygen Intubation Type: IV induction Ventilation: Mask ventilation without difficulty Laryngoscope Size: Mac and 3 Grade View: Grade I Tube type: Oral Tube size: 7.0 mm Number of attempts: 1 Airway Equipment and Method: Stylet Placement Confirmation: ETT inserted through vocal cords under direct vision,  positive ETCO2 and breath sounds checked- equal and bilateral Secured at: 22 cm Tube secured with: Tape Dental Injury: Teeth and Oropharynx as per pre-operative assessment

## 2016-06-17 LAB — SURGICAL PATHOLOGY

## 2016-06-17 NOTE — Anesthesia Postprocedure Evaluation (Signed)
Anesthesia Post Note  Patient: Melissa Christian  Procedure(s) Performed: Procedure(s) (LRB): LAPAROSCOPIC CHOLECYSTECTOMY WITH INTRAOPERATIVE CHOLANGIOGRAM (N/A)  Patient location during evaluation: PACU Anesthesia Type: General Level of consciousness: awake and alert Pain management: pain level controlled Vital Signs Assessment: post-procedure vital signs reviewed and stable Respiratory status: spontaneous breathing, nonlabored ventilation, respiratory function stable and patient connected to nasal cannula oxygen Cardiovascular status: blood pressure returned to baseline and stable Postop Assessment: no signs of nausea or vomiting Anesthetic complications: no    Last Vitals:  Vitals:   06/16/16 1353 06/16/16 1420  BP: (!) 144/54 (!) 147/57  Pulse:  68  Resp: 20 20  Temp: 36.4 C     Last Pain:  Vitals:   06/16/16 1353  TempSrc: Temporal  PainSc:                  Martha Clan

## 2016-07-05 ENCOUNTER — Ambulatory Visit (INDEPENDENT_AMBULATORY_CARE_PROVIDER_SITE_OTHER): Payer: Medicare Other | Admitting: Cardiology

## 2016-07-05 ENCOUNTER — Encounter: Payer: Self-pay | Admitting: Cardiology

## 2016-07-05 VITALS — BP 163/72 | HR 62 | Ht 64.0 in | Wt 240.5 lb

## 2016-07-05 DIAGNOSIS — I2510 Atherosclerotic heart disease of native coronary artery without angina pectoris: Secondary | ICD-10-CM | POA: Diagnosis not present

## 2016-07-05 DIAGNOSIS — IMO0001 Reserved for inherently not codable concepts without codable children: Secondary | ICD-10-CM

## 2016-07-05 DIAGNOSIS — Z6841 Body Mass Index (BMI) 40.0 and over, adult: Secondary | ICD-10-CM

## 2016-07-05 DIAGNOSIS — I10 Essential (primary) hypertension: Secondary | ICD-10-CM | POA: Diagnosis not present

## 2016-07-05 DIAGNOSIS — E6609 Other obesity due to excess calories: Secondary | ICD-10-CM

## 2016-07-05 DIAGNOSIS — E785 Hyperlipidemia, unspecified: Secondary | ICD-10-CM

## 2016-07-05 NOTE — Patient Instructions (Signed)
Follow-Up: Your physician wants you to follow-up in: 6 months with Dr. Ingal. You will receive a reminder letter in the mail two months in advance. If you don't receive a letter, please call our office to schedule the follow-up appointment.  It was a pleasure seeing you today here in the office. Please do not hesitate to give us a call back if you have any further questions. [phone]  Pamela A. RN, BSN    

## 2016-07-05 NOTE — Progress Notes (Signed)
Cardiology Office Note   Date:  07/05/2016   ID:  Melissa Christian, DOB 04-04-1941, MRN [id number]  Referring Doctor:  Glendon Axe, MD   Cardiologist:   Wende Bushy, MD   Reason for consultation:  Chief Complaint  Patient presents with  . other    64mo f/u. Pt states she is doing well. Pt states she had cholecystectomy 12/14. Reviewed meds with pt verbally.      History of Present Illness: Melissa Christian is a 76 y.o. female who presents for  ffup for CAD.  Patient doing quite well. She went for lap chole 06/2016, surgery was uneventful.   She denies chest pain, shortness of breath.  She is back on Brillinta. She did not discontinue the ASA.   In terms of hypertension, blood pressure is within normal limits at home.   No episodes of lightheadedness, no loss of consciousness. She denies orthopnea, PND, edema, palpitations. No issues with bleeding. No issues with fall.  ROS:  Please see the history of present illness. Aside from mentioned under HPI, all other systems are reviewed and negative.     Past Medical History:  Diagnosis Date  . Anemia   . CAD (coronary artery disease)    a. NSTEMI 09/2015: cardiac cath: mLAD 95% s/p PCI/DES 0%, LCx no obs, RCA no obs, LVEF 55-65%  . Chronic diastolic CHF (congestive heart failure) ([place])   . Chronic kidney disease   . Diabetes mellitus without complication ([place])   . High cholesterol   . Hypertension   . Low blood potassium   . NSTEMI (non-ST elevated myocardial infarction) ([place])   . PONV (postoperative nausea and vomiting)     Past Surgical History:  Procedure Laterality Date  . BACK SURGERY    . CARDIAC CATHETERIZATION N/A 09/23/2015   Procedure: Right and Left Heart Cath;  Surgeon: Minna Merritts, MD;  Location: [facility];  Service: Cardiovascular;  Laterality: N/A;  . CARDIAC CATHETERIZATION N/A 09/23/2015   Procedure: Coronary Stent Intervention;  Surgeon: Yolonda Kida, MD;  Location: [facility];  Service: Cardiovascular;  Laterality: N/A;  . CHOLECYSTECTOMY N/A 06/16/2016   Procedure: LAPAROSCOPIC CHOLECYSTECTOMY WITH INTRAOPERATIVE CHOLANGIOGRAM;  Surgeon: Leonie Green, MD;  Location: [facility];  Service: General;  Laterality: N/A;  . CORONARY STENT PLACEMENT    . JOINT REPLACEMENT    . LUMBAR FUSION    . TOTAL HIP ARTHROPLASTY       reports that she has never smoked. She has never used smokeless tobacco. She reports that she does not drink alcohol or use drugs.   family history includes Diabetes in her mother; Heart disease in her father; Hypertension in her mother.   Current Outpatient Prescriptions  Medication Sig Dispense Refill  . aspirin EC 81 MG tablet Take 81 mg by mouth daily.    . cholecalciferol (VITAMIN D) 1000 units tablet Take 2,000 Units by mouth daily.    Marland Kitchen diltiazem (CARDIZEM CD) 240 MG 24 hr capsule Take 480 mg by mouth daily.    . ferrous gluconate (FERGON) 324 MG tablet Take 324 mg by mouth 2 (two) times daily with a meal.     . furosemide (LASIX) 40 MG tablet Take 40 mg by mouth daily as needed for edema.     Marland Kitchen glipiZIDE (GLUCOTROL) 10 MG tablet Take 20 mg by mouth 2 (two) times daily before a meal.    . hydrochlorothiazide (HYDRODIURIL) 25 MG tablet Take 25 mg  by mouth daily.    Marland Kitchen HYDROcodone-acetaminophen (NORCO) 5-325 MG tablet Take 1-2 tablets by mouth every 4 (four) hours as needed for moderate pain. 12 tablet 0  . metoprolol succinate (TOPROL-XL) 100 MG 24 hr tablet Take 100 mg by mouth daily. Take with or immediately following a meal.    . potassium chloride (K-DUR,KLOR-CON) 10 MEQ tablet Take 10 mEq by mouth as needed (when taking Lasix).     . pravastatin (PRAVACHOL) 80 MG tablet Take 80 mg by mouth every morning.     . ticagrelor (BRILINTA) 90 MG TABS tablet Take 1 tablet (90 mg total) by mouth 2 (two) times daily. 180 tablet 3  . valsartan (DIOVAN) 320 MG tablet Take 320 mg by mouth every morning.      No current  facility-administered medications for this visit.     Allergies: Ferrous sulfate; Keflex [cephalexin]; Percocet [oxycodone-acetaminophen]; Vicodin [hydrocodone-acetaminophen]; Ace inhibitors; and Penicillins    PHYSICAL EXAM: VS:  BP (!) 163/72 (BP Location: Left Arm, Patient Position: Sitting, Cuff Size: Normal)   Pulse 62   Ht 5\' 4"  (1.626 m)   Wt 240 lb 8 oz (109.1 kg)   BMI 41.28 kg/m  , Body mass index is 41.28 kg/m. Wt Readings from Last 3 Encounters:  07/05/16 240 lb 8 oz (109.1 kg)  06/16/16 245 lb (111.1 kg)  06/07/16 245 lb (111.1 kg)    GENERAL:  well developed, well nourished, obese, not in acute distress HEENT: normocephalic, pink conjunctivae, anicteric sclerae, no xanthelasma, normal dentition, oropharynx clear NECK:  no neck vein engorgement, JVP normal, no hepatojugular reflux, carotid upstroke brisk and symmetric, no bruit, no thyromegaly, no lymphadenopathy LUNGS:  good respiratory effort, clear to auscultation bilaterally CV:  PMI not displaced, no thrills, no lifts, S1 and S2 within normal limits, no palpable S3 or S4, no murmurs, no rubs, no gallops RFA pulse within normal limits, no bruit ABD:  Soft, nontender, nondistended, normoactive bowel sounds, no abdominal aortic bruit, no hepatomegaly, no splenomegaly MS: nontender back, no kyphosis, no scoliosis, no joint deformities EXT:  2+ DP/PT pulses, no edema, no varicosities, no cyanosis, no clubbing SKIN: warm, nondiaphoretic, normal turgor, no ulcers NEUROPSYCH: alert, oriented to person, place, and time, sensory/motor grossly intact, normal mood, appropriate affect  Recent Labs: 02/01/2016: ALT 12; BUN 32; Creatinine, Ser 1.83; Sodium 136 06/07/2016: Hemoglobin 11.7; Platelets 318; Potassium 4.7   Lipid Panel    Component Value Date/Time   CHOL 174 09/24/2015 0647   TRIG 107 09/24/2015 0647   HDL 48 09/24/2015 0647   CHOLHDL 3.6 09/24/2015 0647   VLDL 21 09/24/2015 0647   LDLCALC 105 (H) 09/24/2015  0647     Other studies Reviewed:  EKG:  The ekg from 10/12/2015 was personally reviewed by me and it revealed sinus rhythm, 60 BPM. LAD, right bundle branch block, LAFB. Bifascicular block. No significant change from 09/23/2015 EKG.  Additional studies/ records that were reviewed personally reviewed by me today include:  Echocardiogram 09/23/2015: Left ventricle: The cavity size was normal. Wall thickness was  increased increased in a pattern of mild to moderate LVH.  Systolic function was normal. The estimated ejection fraction was  in the range of 55% to 60%. Challenging image quality, Unable to  exclude mild mid to distal anterior wall and apical hypokinesis.  Left ventricular diastolic function parameters were normal. - Aortic valve: There was mild regurgitation. - Mitral valve: There was mild regurgitation. - Left atrium: The atrium was normal in size. -  Right ventricle: Systolic function was normal. - Pulmonary arteries: Systolic pressure was within the normal  range.  Right left heart catheterization 09/23/2015:  Mid LAD lesion, 99% stenosed. Post intervention, there is a 0% residual stenosis. The lesion was previously treated with a stent (unknown type). Xience stent 2.75x12  ASSESSMENT AND PLAN:  NSTEMI status post PCI Xience stent to mid LAD, 09/23/2015,Dr. Arida Patient denies angina, shortness of breath. Offered cardiac rehabilitation, patient refuses due to transportation issues. Continue medical therapy: Aspirin 81 mg every daily,Brillinta (dual antiplatelet therapy until 09/22/2016 due to NSTEMI), beta blocker, ARB, statin therapy. LDL goal less than 70.   HTN BP is well controlled. Continue monitoring BP. Continue current medical therapy and lifestyle changes.  Hyperlipidemia From [facility] 11/12/2015: TC 192 TG 150 HDL 46 LDL 116  LDL goal less than 70. Continue statin therapy for now as she is due for rpt blood work per PCP. She went for blood work today  actually. She also has had weight loss. If LDL still > 70, consider switch to atorvastatin or rosuvastatin.   Morbid obesity  Body mass index is 41.28 kg/m. Commended on continued weight loss! Recommend continue aggressive weight loss through diet and increased physical activity.   Aortic insufficiency Mild. No appreciable murmur. Continue to monitor.  Current medicines are reviewed at length with the patient today.  The patient does not have concerns regarding medicines.  Labs/ tests ordered today include:  Orders Placed This Encounter  Procedures  . EKG 12-Lead    I had a lengthy and detailed discussion with the patient regarding diagnoses, prognosis, diagnostic options, treatment options , and side effects of medications.   I counseled the patient on importance of lifestyle modification including heart healthy diet, regular physical activity.   Disposition:   FU with undersigned In 6 months  Signed, Wende Bushy, MD  07/05/2016 9:18 AM    [place]

## 2016-07-19 ENCOUNTER — Other Ambulatory Visit: Payer: Self-pay

## 2016-07-19 MED ORDER — TICAGRELOR 90 MG PO TABS: 90.0000 mg | ORAL_TABLET | Freq: Two times a day (BID) | ORAL | 3 refills | Status: DC

## 2016-08-03 ENCOUNTER — Telehealth: Payer: Self-pay | Admitting: Cardiology

## 2016-08-03 NOTE — Telephone Encounter (Signed)
Left voicemail message to call back  

## 2016-08-03 NOTE — Telephone Encounter (Signed)
Nurse calling from [name] asking if we  Can call her back with pt last echo date and EF Also would like to make sure if patient does indeed have a diagnoses of Heart Failure   Please call back

## 2016-08-03 NOTE — Telephone Encounter (Signed)
Nurse calling because patient is registered in their telephonic heart failure program. She wanted recent visit vital signs and EF. Reviewed that information with her and she verbalized understanding and had no further questions at this time.

## 2017-01-12 NOTE — Progress Notes (Signed)
Cardiology Office Note  Date:  01/13/2017   ID:  Adell, Koval 1941-04-12, MRN [id number]  PCP:  Glendon Axe, MD   Chief Complaint  Patient presents with  . OTHER    6 month f/u no complaints today. Meds reviewed verbally with pt.    HPI:   Melissa Christian is a 76 y.o. female with hx of CAD. NSTEMI 09/2015: cardiac cath: mLAD 95% s/p PCI/DES 0%, LCx no obs, RCA no obs, LVEF 55-65%,Xience stent  lap chole 06/2016, surgery was uneventful.  hypertension,  EF >55% on echo 09/23/15 Hyperlipidemia Mild AI Who presents for follow up of her CAD, S/p PCI  In follow-up she denies any significant chest pain or shortness of breath with exertion  Difficulty with her sugars, unable to afford trulicity until she received help from insurance Hemoglobin A1c 8.0, up from 6.6 trulicity up to $811 Now down to $8.35  Total cholesterol 190 last year on some low-dose pravastatin Denies any myalgias  No episodes of lightheadedness, no loss of consciousness. She denies orthopnea, PND, edema, palpitations. No issues with bleeding. No issues with fall.  EKG personally reviewed by myself on todays visit Shows sinus bradycardia rate 58 bpm left axis deviation  PMH:   has a past medical history of Anemia; CAD (coronary artery disease); Chronic diastolic CHF (congestive heart failure) ([place]); Chronic kidney disease; Diabetes mellitus without complication ([place]); High cholesterol; Hypertension; Low blood potassium; NSTEMI (non-ST elevated myocardial infarction) ([place]); and PONV (postoperative nausea and vomiting).  PSH:    Past Surgical History:  Procedure Laterality Date  . BACK SURGERY    . CARDIAC CATHETERIZATION N/A 09/23/2015   Procedure: Right and Left Heart Cath;  Surgeon: Minna Merritts, MD;  Location: [place] [facility];  Service: Cardiovascular;  Laterality: N/A;  . CARDIAC CATHETERIZATION N/A 09/23/2015   Procedure: Coronary Stent Intervention;  Surgeon: Yolonda Kida, MD;   Location: [place] [facility];  Service: Cardiovascular;  Laterality: N/A;  . CHOLECYSTECTOMY N/A 06/16/2016   Procedure: LAPAROSCOPIC CHOLECYSTECTOMY WITH INTRAOPERATIVE CHOLANGIOGRAM;  Surgeon: Leonie Green, MD;  Location: [facility];  Service: General;  Laterality: N/A;  . CORONARY STENT PLACEMENT    . JOINT REPLACEMENT    . LUMBAR FUSION    . TOTAL HIP ARTHROPLASTY      Current Outpatient Prescriptions  Medication Sig Dispense Refill  . aspirin EC 81 MG tablet Take 81 mg by mouth daily.    . cholecalciferol (VITAMIN D) 1000 units tablet Take 2,000 Units by mouth daily.    Marland Kitchen diltiazem (CARDIZEM CD) 240 MG 24 hr capsule Take 480 mg by mouth daily.    . Dulaglutide (TRULICITY [place]) Inject into the skin once a week.    . ferrous gluconate (FERGON) 324 MG tablet Take 324 mg by mouth 2 (two) times daily with a meal.     . furosemide (LASIX) 40 MG tablet Take 40 mg by mouth daily as needed for edema.     Marland Kitchen glipiZIDE (GLUCOTROL) 10 MG tablet Take 20 mg by mouth 2 (two) times daily before a meal.    . hydrochlorothiazide (HYDRODIURIL) 25 MG tablet Take 25 mg by mouth daily.    Marland Kitchen HYDROcodone-acetaminophen (NORCO) 5-325 MG tablet Take 1-2 tablets by mouth every 4 (four) hours as needed for moderate pain. 12 tablet 0  . metoprolol succinate (TOPROL-XL) 100 MG 24 hr tablet Take 100 mg by mouth daily. Take with or immediately following a meal.    . potassium  chloride (K-DUR,KLOR-CON) 10 MEQ tablet Take 10 mEq by mouth as needed (when taking Lasix).     . pravastatin (PRAVACHOL) 80 MG tablet Take 80 mg by mouth every morning.     . valsartan (DIOVAN) 320 MG tablet Take 320 mg by mouth every morning.      No current facility-administered medications for this visit.      Allergies:   Ferrous sulfate; Keflex [cephalexin]; Percocet [oxycodone-acetaminophen]; Vicodin [hydrocodone-acetaminophen]; Ace inhibitors; and Penicillins   Social History:  The patient  reports that she has never smoked. She  has never used smokeless tobacco. She reports that she does not drink alcohol or use drugs.   Family History:   family history includes Diabetes in her mother; Heart disease in her father; Hypertension in her mother.    Review of Systems: ROS   PHYSICAL EXAM: VS:  BP (!) 152/76 (BP Location: Left Arm, Patient Position: Sitting, Cuff Size: Large)   Pulse (!) 58   Ht 5\' 4"  (1.626 m)   Wt 256 lb (116.1 kg)   BMI 43.94 kg/m  , BMI Body mass index is 43.94 kg/m. GEN: Well nourished, well developed, in no acute distress  HEENT: normal  Neck: no JVD, carotid bruits, or masses Cardiac: RRR; no murmurs, rubs, or gallops,no edema  Respiratory:  clear to auscultation bilaterally, normal work of breathing GI: soft, nontender, nondistended, + BS MS: no deformity or atrophy  Skin: warm and dry, no rash Neuro:  Strength and sensation are intact Psych: euthymic mood, full affect    Recent Labs: 02/01/2016: ALT 12; BUN 32; Creatinine, Ser 1.83; Sodium 136 06/07/2016: Hemoglobin 11.7; Platelets 318; Potassium 4.7    Lipid Panel Lab Results  Component Value Date   CHOL 174 09/24/2015   HDL 48 09/24/2015   LDLCALC 105 (H) 09/24/2015   TRIG 107 09/24/2015      Wt Readings from Last 3 Encounters:  01/13/17 256 lb (116.1 kg)  07/05/16 240 lb 8 oz (109.1 kg)  06/16/16 245 lb (111.1 kg)       ASSESSMENT AND PLAN:  Coronary artery disease involving native coronary artery of native heart with unstable angina pectoris ([place]) - Plan: EKG 12-Lead Currently with no symptoms of angina. No further workup at this time. Continue current medication regimen.  Mixed hyperlipidemia - Plan: EKG 12-Lead Recommended she stop the pravastatin and start Crestor 40 mg daily Goal LDL less than 70 We have offered to do lab work in 3 months but she reports that she has close follow-up with primary care   Essential hypertension - Plan: EKG 12-Lead Blood pressure elevated today. She reports is typically  well controlled Recommended she monitor blood pressure at home and call our office if blood pressure runs high  Type 2 diabetes mellitus with other circulatory complication, without long-term current use of insulin ([place]) - Plan: EKG 12-Lead We have encouraged continued exercise, careful diet management in an effort to lose weight.  Morbid obesity due to excess calories ([place]) - Plan: EKG 12-Lead Recommended regular walking program She has leg weakness   Total encounter time more than 25 minutes  Greater than 50% was spent in counseling and coordination of care with the patient   Disposition:   F/U  12 months   Orders Placed This Encounter  Procedures  . EKG 12-Lead     Signed, Esmond Plants, M.D., Ph.D. 01/13/2017  [place], [place]

## 2017-01-13 ENCOUNTER — Encounter: Payer: Self-pay | Admitting: Cardiovascular Disease

## 2017-01-13 ENCOUNTER — Ambulatory Visit (INDEPENDENT_AMBULATORY_CARE_PROVIDER_SITE_OTHER): Payer: Medicare Other | Admitting: Cardiovascular Disease

## 2017-01-13 VITALS — BP 152/76 | HR 58 | Ht 64.0 in | Wt 256.0 lb

## 2017-01-13 DIAGNOSIS — E782 Mixed hyperlipidemia: Secondary | ICD-10-CM | POA: Diagnosis not present

## 2017-01-13 DIAGNOSIS — I10 Essential (primary) hypertension: Secondary | ICD-10-CM

## 2017-01-13 DIAGNOSIS — I25110 Atherosclerotic heart disease of native coronary artery with unstable angina pectoris: Secondary | ICD-10-CM

## 2017-01-13 DIAGNOSIS — E1159 Type 2 diabetes mellitus with other circulatory complications: Secondary | ICD-10-CM | POA: Diagnosis not present

## 2017-01-13 MED ORDER — ROSUVASTATIN CALCIUM 40 MG PO TABS: 40.0000 mg | ORAL_TABLET | Freq: Every day | ORAL | 3 refills | Status: DC

## 2017-01-13 NOTE — Patient Instructions (Addendum)
Medication Instructions:   Use up the pravastatin When you run out start crestor 40 mg once a day  Labwork:  Ask Dr. Candiss Norse about labs in 3 months  Testing/Procedures:  No further testing at this time   Follow-Up: It was a pleasure seeing you in the office today. Please call us if you have new issues that need to be addressed before your next appt.  [phone]  Your physician wants you to follow-up in: 12 months.  You will receive a reminder letter in the mail two months in advance. If you don't receive a letter, please call our office to schedule the follow-up appointment.  If you need a refill on your cardiac medications before your next appointment, please call your pharmacy.

## 2017-04-24 NOTE — Discharge Instructions (Signed)

## 2017-04-26 ENCOUNTER — Ambulatory Visit: Payer: Medicare Other | Admitting: Anesthesiology

## 2017-04-26 ENCOUNTER — Encounter
Admission: RE | Disposition: A | Discharge status: Home / Self Care | Payer: Self-pay | Source: Ambulatory Visit | Attending: Ophthalmology

## 2017-04-26 ENCOUNTER — Ambulatory Visit
Admission: RE | Admit: 2017-04-26 | Discharge: 2017-04-26 | Disposition: A | Discharge status: Home / Self Care | Payer: Medicare Other | Source: Ambulatory Visit | Attending: Ophthalmology | Admitting: Ophthalmology

## 2017-04-26 DIAGNOSIS — H2512 Age-related nuclear cataract, left eye: Secondary | ICD-10-CM | POA: Insufficient documentation

## 2017-04-26 DIAGNOSIS — I2510 Atherosclerotic heart disease of native coronary artery without angina pectoris: Secondary | ICD-10-CM | POA: Insufficient documentation

## 2017-04-26 DIAGNOSIS — I252 Old myocardial infarction: Secondary | ICD-10-CM | POA: Diagnosis not present

## 2017-04-26 DIAGNOSIS — I10 Essential (primary) hypertension: Secondary | ICD-10-CM | POA: Insufficient documentation

## 2017-04-26 DIAGNOSIS — E119 Type 2 diabetes mellitus without complications: Secondary | ICD-10-CM | POA: Diagnosis not present

## 2017-04-26 HISTORY — PX: CATARACT EXTRACTION W/PHACO: SHX586

## 2017-04-26 LAB — GLUCOSE, CAPILLARY
Glucose-Capillary: 206 mg/dL — ABNORMAL HIGH (ref 65–99)
Glucose-Capillary: 202 mg/dL — ABNORMAL HIGH (ref 65–99)

## 2017-04-26 SURGERY — PHACOEMULSIFICATION, CATARACT, WITH IOL INSERTION
Anesthesia: Monitor Anesthesia Care | Laterality: Left | Wound class: Clean

## 2017-04-26 MED ORDER — BRIMONIDINE TARTRATE-TIMOLOL 0.2-0.5 % OP SOLN
OPHTHALMIC | Status: DC | PRN
  Administered 2017-04-26: 1 [drp] via OPHTHALMIC

## 2017-04-26 MED ORDER — FENTANYL CITRATE (PF) 100 MCG/2ML IJ SOLN
INTRAMUSCULAR | Status: DC | PRN
  Administered 2017-04-26: 50 ug via INTRAVENOUS

## 2017-04-26 MED ORDER — BSS IO SOLN
INTRAOCULAR | Status: DC | PRN
  Administered 2017-04-26: 63 mL via OPHTHALMIC

## 2017-04-26 MED ORDER — MOXIFLOXACIN HCL 0.5 % OP SOLN
OPHTHALMIC | Status: DC | PRN
  Administered 2017-04-26: 0.2 mL via OPHTHALMIC

## 2017-04-26 MED ORDER — MOXIFLOXACIN HCL 0.5 % OP SOLN
1.0000 [drp] | OPHTHALMIC | Status: DC | PRN
  Administered 2017-04-26 (×3): 1 [drp] via OPHTHALMIC

## 2017-04-26 MED ORDER — LACTATED RINGERS IV SOLN: INTRAVENOUS | Status: DC

## 2017-04-26 MED ORDER — ARMC OPHTHALMIC DILATING DROPS
1.0000 "application " | OPHTHALMIC | Status: DC | PRN
  Administered 2017-04-26 (×3): 1 via OPHTHALMIC

## 2017-04-26 MED ORDER — NA HYALUR & NA CHOND-NA HYALUR 0.4-0.35 ML IO KIT
PACK | INTRAOCULAR | Status: DC | PRN
  Administered 2017-04-26: 1 mL via INTRAOCULAR

## 2017-04-26 MED ORDER — ACETAMINOPHEN 325 MG PO TABS: 325.0000 mg | ORAL_TABLET | Freq: Once | ORAL | Status: DC

## 2017-04-26 MED ORDER — MIDAZOLAM HCL 2 MG/2ML IJ SOLN
INTRAMUSCULAR | Status: DC | PRN
  Administered 2017-04-26: 2 mg via INTRAVENOUS

## 2017-04-26 MED ORDER — ACETAMINOPHEN 160 MG/5ML PO SOLN
325.0000 mg | Freq: Once | ORAL | Status: DC
Start: 2017-04-26 — End: 2017-04-26

## 2017-04-26 MED ORDER — LIDOCAINE HCL (PF) 2 % IJ SOLN
INTRAOCULAR | Status: DC | PRN
  Administered 2017-04-26: 1 mL

## 2017-04-26 SURGICAL SUPPLY — 25 items
CANNULA ANT/CHMB 27GA (MISCELLANEOUS) ×3 IMPLANT
CARTRIDGE ABBOTT (MISCELLANEOUS) IMPLANT
GLOVE SURG LX 7.5 STRW (GLOVE) ×2
GLOVE SURG LX STRL 7.5 STRW (GLOVE) ×1 IMPLANT
GLOVE SURG TRIUMPH 8.0 PF LTX (GLOVE) ×3 IMPLANT
GOWN STRL REUS W/ TWL LRG LVL3 (GOWN DISPOSABLE) ×2 IMPLANT
GOWN STRL REUS W/TWL LRG LVL3 (GOWN DISPOSABLE) ×4
LENS IOL TECNIS ITEC 21.5 (Intraocular Lens) ×3 IMPLANT
MARKER SKIN DUAL TIP RULER LAB (MISCELLANEOUS) ×3 IMPLANT
NDL RETROBULBAR .5 NSTRL (NEEDLE) IMPLANT
NEEDLE FILTER BLUNT 18X 1/2SAF (NEEDLE) ×2
NEEDLE FILTER BLUNT 18X1 1/2 (NEEDLE) ×1 IMPLANT
PACK CATARACT BRASINGTON (MISCELLANEOUS) ×3 IMPLANT
PACK EYE AFTER SURG (MISCELLANEOUS) ×3 IMPLANT
PACK OPTHALMIC (MISCELLANEOUS) ×3 IMPLANT
RING MALYGIN 7.0 (MISCELLANEOUS) IMPLANT
SUT ETHILON 10-0 CS-B-6CS-B-6 (SUTURE)
SUT VICRYL  9 0 (SUTURE)
SUT VICRYL 9 0 (SUTURE) IMPLANT
SUTURE EHLN 10-0 CS-B-6CS-B-6 (SUTURE) IMPLANT
SYR 3ML LL SCALE MARK (SYRINGE) ×3 IMPLANT
SYR 5ML LL (SYRINGE) ×3 IMPLANT
SYR TB 1ML LUER SLIP (SYRINGE) ×3 IMPLANT
WATER STERILE IRR 250ML POUR (IV SOLUTION) ×3 IMPLANT
WIPE NON LINTING 3.25X3.25 (MISCELLANEOUS) ×3 IMPLANT

## 2017-04-26 NOTE — Anesthesia Preprocedure Evaluation (Signed)
Anesthesia Evaluation  Patient identified by MRN, date of birth, ID band Patient awake    Reviewed: Allergy & Precautions, H&P , NPO status , Patient's Chart, lab work & pertinent test results  History of Anesthesia Complications (+) PONV  Airway Mallampati: III  TM Distance: >3 FB Neck ROM: full    Dental  (+) Upper Dentures, Lower Dentures   Pulmonary    Pulmonary exam normal breath sounds clear to auscultation       Cardiovascular hypertension, + CAD and + Past MI  Normal cardiovascular exam Rhythm:regular Rate:Normal     Neuro/Psych    GI/Hepatic   Endo/Other  diabetes, Poorly ControlledMorbid obesity  Renal/GU Renal disease     Musculoskeletal   Abdominal   Peds  Hematology   Anesthesia Other Findings   Reproductive/Obstetrics                             Anesthesia Physical Anesthesia Plan  ASA: III  Anesthesia Plan: MAC   Post-op Pain Management:    Induction:   PONV Risk Score and Plan: 3 and Midazolam  Airway Management Planned:   Additional Equipment:   Intra-op Plan:   Post-operative Plan:   Informed Consent: I have reviewed the patients History and Physical, chart, labs and discussed the procedure including the risks, benefits and alternatives for the proposed anesthesia with the patient or authorized representative who has indicated his/her understanding and acceptance.     Plan Discussed with: CRNA  Anesthesia Plan Comments:         Anesthesia Quick Evaluation

## 2017-04-26 NOTE — Op Note (Signed)
OPERATIVE NOTE  Melissa Christian [id number] 04/26/2017   PREOPERATIVE DIAGNOSIS:  Nuclear sclerotic cataract left eye. H25.12   POSTOPERATIVE DIAGNOSIS:    Nuclear sclerotic cataract left eye.     PROCEDURE:  Phacoemusification with posterior chamber intraocular lens placement of the left eye   LENS:   Implant Name Type Inv. Item Serial No. Manufacturer Lot No. LRB No. Used  LENS IOL DIOP 21.5 - [id number] Intraocular Lens LENS IOL DIOP 21.5 [id number] AMO   Left 1        ULTRASOUND TIME: 16  % of 1 minutes 10 seconds, CDE 11.5  SURGEON:  Wyonia Hough, MD   ANESTHESIA:  Topical with tetracaine drops and 2% Xylocaine jelly, augmented with 1% preservative-free intracameral lidocaine.    COMPLICATIONS:  None.   DESCRIPTION OF PROCEDURE:  The patient was identified in the holding room and transported to the operating room and placed in the supine position under the operating microscope.  The left eye was identified as the operative eye and it was prepped and draped in the usual sterile ophthalmic fashion.   A 1 millimeter clear-corneal paracentesis was made at the 1:30 position.  0.5 ml of preservative-free 1% lidocaine was injected into the anterior chamber.  The anterior chamber was filled with Viscoat viscoelastic.  A 2.4 millimeter keratome was used to make a near-clear corneal incision at the 10:30 position.  .  A curvilinear capsulorrhexis was made with a cystotome and capsulorrhexis forceps.  Balanced salt solution was used to hydrodissect and hydrodelineate the nucleus.   Phacoemulsification was then used in stop and chop fashion to remove the lens nucleus and epinucleus.  The remaining cortex was then removed using the irrigation and aspiration handpiece. Provisc was then placed into the capsular bag to distend it for lens placement.  A lens was then injected into the capsular bag.  The remaining viscoelastic was aspirated.   Wounds were hydrated with balanced salt  solution.  The anterior chamber was inflated to a physiologic pressure with balanced salt solution.  No wound leaks were noted. Vigamox 0.2 ml of a 1mg  per ml solution was injected into the anterior chamber for a dose of 0.2 mg of intracameral antibiotic at the completion of the case.   Timolol and Brimonidine drops were applied to the eye.  The patient was taken to the recovery room in stable condition without complications of anesthesia or surgery.  Melissa Christian 04/26/2017, 8:08 AM

## 2017-04-26 NOTE — Anesthesia Procedure Notes (Signed)
Procedure Name: MAC Performed by: Mayme Genta Pre-anesthesia Checklist: Patient identified, Emergency Drugs available, Suction available, Timeout performed and Patient being monitored Patient Re-evaluated:Patient Re-evaluated prior to induction Oxygen Delivery Method: Nasal cannula Placement Confirmation: positive ETCO2

## 2017-04-26 NOTE — Anesthesia Postprocedure Evaluation (Signed)
Anesthesia Post Note  Patient: Melissa Christian  Procedure(s) Performed: CATARACT EXTRACTION PHACO AND INTRAOCULAR LENS PLACEMENT (IOC) LEFT DIABETIC (Left )  Patient location during evaluation: PACU Anesthesia Type: MAC Level of consciousness: awake and alert and oriented Pain management: satisfactory to patient Vital Signs Assessment: post-procedure vital signs reviewed and stable Respiratory status: spontaneous breathing, nonlabored ventilation and respiratory function stable Cardiovascular status: blood pressure returned to baseline and stable Postop Assessment: Adequate PO intake and No signs of nausea or vomiting Anesthetic complications: no    Raliegh Ip

## 2017-04-26 NOTE — H&P (Signed)
The History and Physical notes are on paper, have been signed, and are to be scanned. The patient remains stable and unchanged from the H&P.   Previous H&P reviewed, patient examined, and there are no changes.  Kimerly Rowand 04/26/2017 7:39 AM

## 2017-04-26 NOTE — Transfer of Care (Signed)
Immediate Anesthesia Transfer of Care Note  Patient: Melissa Christian  Procedure(s) Performed: CATARACT EXTRACTION PHACO AND INTRAOCULAR LENS PLACEMENT (IOC) LEFT DIABETIC (Left )  Patient Location: PACU  Anesthesia Type: MAC  Level of Consciousness: awake, alert  and patient cooperative  Airway and Oxygen Therapy: Patient Spontanous Breathing and Patient connected to supplemental oxygen  Post-op Assessment: Post-op Vital signs reviewed, Patient's Cardiovascular Status Stable, Respiratory Function Stable, Patent Airway and No signs of Nausea or vomiting  Post-op Vital Signs: Reviewed and stable  Complications: No apparent anesthesia complications

## 2017-04-27 ENCOUNTER — Encounter: Payer: Self-pay | Admitting: Ophthalmology

## 2017-05-10 ENCOUNTER — Other Ambulatory Visit: Payer: Self-pay | Admitting: Internal Medicine

## 2017-05-10 DIAGNOSIS — Z1231 Encounter for screening mammogram for malignant neoplasm of breast: Secondary | ICD-10-CM

## 2017-05-16 ENCOUNTER — Ambulatory Visit
Admission: RE | Admit: 2017-05-16 | Discharge: 2017-05-16 | Disposition: A | Discharge status: Home / Self Care | Payer: Medicare Other | Source: Ambulatory Visit | Attending: Urology | Admitting: Urology

## 2017-05-16 DIAGNOSIS — N2889 Other specified disorders of kidney and ureter: Secondary | ICD-10-CM | POA: Diagnosis not present

## 2017-05-16 DIAGNOSIS — N281 Cyst of kidney, acquired: Secondary | ICD-10-CM | POA: Insufficient documentation

## 2017-05-22 ENCOUNTER — Ambulatory Visit: Payer: Medicare Other | Admitting: Urology

## 2017-05-22 ENCOUNTER — Encounter: Payer: Self-pay | Admitting: Urology

## 2017-05-22 DIAGNOSIS — N281 Cyst of kidney, acquired: Secondary | ICD-10-CM | POA: Diagnosis not present

## 2017-05-22 NOTE — Progress Notes (Signed)
05/22/2017 9:53 AM   Melissa Christian 13-Aug-1940 [id number]  Referring provider: Glendon Axe, MD [facility] [place], [place] [address]  Chief Complaint  Patient presents with  . Follow-up    Renal US results    HPI:  F/u -   1) left renal mass - She underwent MRI abd which showed an 75m left renal mass likely representing a hemorrhagic cyst. The lesion was initially found on a CT scan obtained for right upper quadrant pain and it measured 13 mm. She denies any left flank pain.  She has a diagnosis of CKD with GFR 30-36. She has DMII.   She returns for the above and underwent renal US a 1.5 cm complex cyst left lower renal pole. Minimal enlargement from prior exams cannot be excluded. I reviewed the images and compared to prior CT and MRI images. She denies significant LUTS. Nocturia 1x. No hematuria. Her bun was 31 and cr 1.8.     PMH: Past Medical History:  Diagnosis Date  . Anemia   . CAD (coronary artery disease)    a. NSTEMI 09/2015: cardiac cath: mLAD 95% s/p PCI/DES 0%, LCx no obs, RCA no obs, LVEF 55-65%  . Chronic diastolic CHF (congestive heart failure) ([place])   . Chronic kidney disease   . Diabetes mellitus without complication ([place])   . High cholesterol   . Hypertension   . Low blood potassium   . NSTEMI (non-ST elevated myocardial infarction) ([place])   . PONV (postoperative nausea and vomiting)     Surgical History: Past Surgical History:  Procedure Laterality Date  . BACK SURGERY    . CATARACT EXTRACTION PHACO AND INTRAOCULAR LENS PLACEMENT ([place]) LEFT DIABETIC Left 04/26/2017   Performed by Leandrew Koyanagi, MD at [facility]  . Coronary Stent Intervention N/A 09/23/2015   Performed by Yolonda Kida, MD at [place] [facility]  . CORONARY STENT PLACEMENT    . JOINT REPLACEMENT    . LAPAROSCOPIC CHOLECYSTECTOMY WITH INTRAOPERATIVE CHOLANGIOGRAM N/A 06/16/2016   Performed by Leonie Green, MD at [facility]    . LUMBAR FUSION    . Right and Left Heart Cath N/A 09/23/2015   Performed by Minna Merritts, MD at [place] [facility]  . TOTAL HIP ARTHROPLASTY      Home Medications:  Allergies as of 05/22/2017      Reactions   Ferrous Sulfate Itching   Keflex [cephalexin] Itching   Percocet [oxycodone-acetaminophen] Itching   Vicodin [hydrocodone-acetaminophen] Itching   Ace Inhibitors Rash   elevated potassium at higher doses   Penicillins Rash   Has patient had a PCN reaction causing immediate rash, facial/tongue/throat swelling, SOB or lightheadedness with hypotension: No Has patient had a PCN reaction causing severe rash involving mucus membranes or skin necrosis: No Has patient had a PCN reaction that required hospitalization No Has patient had a PCN reaction occurring within the last 10 years: No If all of the above answers are "NO", then may proceed with Cephalosporin use.      Medication List        Accurate as of 05/22/17  9:53 AM. Always use your most recent med list.          aspirin EC 81 MG tablet Take 81 mg by mouth daily.   cholecalciferol 1000 units tablet Commonly known as:  VITAMIN D Take 2,000 Units by mouth daily.   diltiazem 240 MG 24 hr capsule Commonly known as:  CARDIZEM CD Take 480 mg by mouth daily.   ferrous gluconate 324 MG tablet Commonly known as:  FERGON Take 324 mg by mouth 2 (two) times daily with a meal.   furosemide 40 MG tablet Commonly known as:  LASIX Take 40 mg by mouth daily as needed for edema.   glipiZIDE 10 MG tablet Commonly known as:  GLUCOTROL Take 20 mg by mouth 2 (two) times daily before a meal.   hydrochlorothiazide 25 MG tablet Commonly known as:  HYDRODIURIL Take 25 mg by mouth daily.   HYDROcodone-acetaminophen 5-325 MG tablet Commonly known as:  NORCO Take 1-2 tablets by mouth every 4 (four) hours as needed for moderate pain.   losartan 100 MG tablet Commonly known as:  COZAAR Take 100 mg by mouth daily.    metoprolol succinate 100 MG 24 hr tablet Commonly known as:  TOPROL-XL Take 100 mg by mouth daily. Take with or immediately following a meal.   potassium chloride 10 MEQ tablet Commonly known as:  K-DUR,KLOR-CON Take 10 mEq by mouth as needed (when taking Lasix).   rosuvastatin 40 MG tablet Commonly known as:  CRESTOR Take 1 tablet (40 mg total) by mouth daily.   TRULICITY [place] Inject into the skin once a week.   valsartan 320 MG tablet Commonly known as:  DIOVAN Take 320 mg by mouth every morning.       Allergies:  Allergies  Allergen Reactions  . Ferrous Sulfate Itching  . Keflex [Cephalexin] Itching  . Percocet [Oxycodone-Acetaminophen] Itching  . Vicodin [Hydrocodone-Acetaminophen] Itching  . Ace Inhibitors Rash    elevated potassium at higher doses  . Penicillins Rash    Has patient had a PCN reaction causing immediate rash, facial/tongue/throat swelling, SOB or lightheadedness with hypotension: No Has patient had a PCN reaction causing severe rash involving mucus membranes or skin necrosis: No Has patient had a PCN reaction that required hospitalization No Has patient had a PCN reaction occurring within the last 10 years: No If all of the above answers are "NO", then may proceed with Cephalosporin use.     Family History: Family History  Problem Relation Age of Onset  . Heart disease Father   . Diabetes Mother   . Hypertension Mother     Social History:  reports that  has never smoked. she has never used smokeless tobacco. She reports that she does not drink alcohol or use drugs.  ROS: UROLOGY Frequent Urination?: No Hard to postpone urination?: No Burning/pain with urination?: No Get up at night to urinate?: No Leakage of urine?: No Urine stream starts and stops?: No Trouble starting stream?: No Do you have to strain to urinate?: No Blood in urine?: No Urinary tract infection?: No Sexually transmitted disease?: No Injury to kidneys or bladder?:  No Painful intercourse?: No Weak stream?: No Currently pregnant?: No Vaginal bleeding?: No Last menstrual period?: n  Gastrointestinal Nausea?: No Vomiting?: No Indigestion/heartburn?: No Diarrhea?: No Constipation?: No  Constitutional Fever: No Night sweats?: No Weight loss?: No Fatigue?: No  Skin Skin rash/lesions?: No Itching?: No  Eyes Blurred vision?: No Double vision?: No  Ears/Nose/Throat Sore throat?: No Sinus problems?: No  Hematologic/Lymphatic Swollen glands?: No Easy bruising?: No  Cardiovascular Leg swelling?: No Chest pain?: No  Respiratory Cough?: No Shortness of breath?: No  Endocrine Excessive thirst?: No  Musculoskeletal Back pain?: No Joint pain?: No  Neurological Headaches?: No Dizziness?: No  Psychologic Depression?: No Anxiety?: No  Physical Exam: BP 134/71 (BP Location: Right Arm, Patient  Position: Sitting, Cuff Size: Normal)   Pulse 83   Ht 5\' 4"  (1.626 m)   Wt 116.3 kg (256 lb 8 oz)   BMI 44.03 kg/m   Constitutional:  Alert and oriented, No acute distress. HEENT: [place] AT, moist mucus membranes.  Trachea midline, no masses. Cardiovascular: No clubbing, cyanosis, or edema. Respiratory: Normal respiratory effort, no increased work of breathing. GI: Abdomen is soft, nontender, nondistended, no abdominal masses GU: No CVA tenderness.  Skin: No rashes, bruises or suspicious lesions. Neurologic: Grossly intact, no focal deficits, moving all 4 extremities. Psychiatric: Normal mood and affect.  Laboratory Data: Lab Results  Component Value Date   WBC 10.4 06/07/2016   HGB 11.7 (L) 06/07/2016   HCT 35.3 06/07/2016   MCV 90.8 06/07/2016   PLT 318 06/07/2016    Lab Results  Component Value Date   CREATININE 1.83 (H) 02/01/2016    No results found for: PSA1  No results found for: TESTOSTERONE  Lab Results  Component Value Date   HGBA1C 8.8 (H) 09/23/2015    Urinalysis Lab Results  Component Value Date    APPEARANCEUR CLEAR (A) 02/01/2016   LEUKOCYTESUR NEGATIVE 02/01/2016   PROTEINUR NEGATIVE 02/01/2016   GLUCOSEU >500 (A) 02/01/2016   RBCU NONE SEEN 02/01/2016   BILIRUBINUR NEGATIVE 02/01/2016   NITRITE NEGATIVE 02/01/2016    Lab Results  Component Value Date   BACTERIA RARE (A) 02/01/2016    Pertinent Imaging: [place], MRI and CT   No results found for this or any previous visit. No results found for this or any previous visit. No results found for this or any previous visit. No results found for this or any previous visit. Results for orders placed during the hospital encounter of 05/16/17  US Renal   Narrative CLINICAL DATA:  Renal mass.  EXAM: RENAL / URINARY TRACT ULTRASOUND COMPLETE  COMPARISON:  MRI 03/09/2016.  CT 02/01/2016  FINDINGS: Right Kidney:  Length: 10.1 cm. Echogenicity within normal limits. No mass or hydronephrosis visualized.  Left Kidney:  Length: 9.8 cm. Echogenicity within normal limits. No hydronephrosis visualized. 1.5 cm complex cyst left lower renal pole. Minimal enlargement from prior exam cannot be excluded. Follow-up gadolinium-enhanced MRI of the kidneys can be obtained to further evaluate . Minimal interim growth from prior exam cannot be excluded. Follow-up gadolinium-enhanced MRI of the kidneys can be obtained for further evaluation and for comparison to prior MRI at 03/09/2016. Given the lack of prominent interval growth, the lesion is most likely benign.  Bladder:  Appears normal for degree of bladder distention.  IMPRESSION: 1.5 cm complex cyst left lower renal pole. Minimal enlargement from prior exams cannot be excluded. Follow-up gadolinium-enhanced MRI of the kidneys can be obtained for further evaluation and for comparison to prior MRI of 03/09/2016. Given the lack of prominent growth interval growth, the lesion is most likely benign.   Electronically Signed   By: Marcello Moores  Register   On: 05/16/2017 11:35    No  results found for this or any previous visit. No results found for this or any previous visit. No results found for this or any previous visit.  Assessment & Plan:    Left renal cyst/mass - minimal change but it did measure a couple of mm larger on the [place]. We'll check an MRI in 6 mo and then maybe she could go back to yearly [place] surveillance.    There are no diagnoses linked to this encounter.  No Follow-up on file.  Festus Aloe, MD  [facility] [address] [place], [place] [address] [phone]

## 2017-05-23 ENCOUNTER — Ambulatory Visit: Payer: Medicare Other

## 2017-05-23 NOTE — Discharge Instructions (Signed)

## 2017-05-31 ENCOUNTER — Ambulatory Visit
Admission: RE | Admit: 2017-05-31 | Discharge: 2017-05-31 | Disposition: A | Discharge status: Home / Self Care | Payer: Medicare Other | Source: Ambulatory Visit | Attending: Ophthalmology | Admitting: Ophthalmology

## 2017-05-31 ENCOUNTER — Ambulatory Visit: Payer: Medicare Other | Admitting: Anesthesiology

## 2017-05-31 ENCOUNTER — Encounter
Admission: RE | Disposition: A | Discharge status: Home / Self Care | Payer: Self-pay | Source: Ambulatory Visit | Attending: Ophthalmology

## 2017-05-31 DIAGNOSIS — I252 Old myocardial infarction: Secondary | ICD-10-CM | POA: Diagnosis not present

## 2017-05-31 DIAGNOSIS — I10 Essential (primary) hypertension: Secondary | ICD-10-CM | POA: Diagnosis not present

## 2017-05-31 DIAGNOSIS — E119 Type 2 diabetes mellitus without complications: Secondary | ICD-10-CM | POA: Diagnosis not present

## 2017-05-31 DIAGNOSIS — H2511 Age-related nuclear cataract, right eye: Secondary | ICD-10-CM | POA: Diagnosis not present

## 2017-05-31 DIAGNOSIS — I2510 Atherosclerotic heart disease of native coronary artery without angina pectoris: Secondary | ICD-10-CM | POA: Insufficient documentation

## 2017-05-31 DIAGNOSIS — Z6841 Body Mass Index (BMI) 40.0 and over, adult: Secondary | ICD-10-CM | POA: Insufficient documentation

## 2017-05-31 HISTORY — PX: CATARACT EXTRACTION W/PHACO: SHX586

## 2017-05-31 LAB — GLUCOSE, CAPILLARY
GLUCOSE-CAPILLARY: 147 mg/dL — AB (ref 65–99)
GLUCOSE-CAPILLARY: 165 mg/dL — AB (ref 65–99)

## 2017-05-31 SURGERY — PHACOEMULSIFICATION, CATARACT, WITH IOL INSERTION
Anesthesia: Monitor Anesthesia Care | Laterality: Right | Wound class: Clean

## 2017-05-31 MED ORDER — NA HYALUR & NA CHOND-NA HYALUR 0.4-0.35 ML IO KIT
PACK | INTRAOCULAR | Status: DC | PRN
  Administered 2017-05-31: 1 mL via INTRAOCULAR

## 2017-05-31 MED ORDER — FENTANYL CITRATE (PF) 100 MCG/2ML IJ SOLN
INTRAMUSCULAR | Status: DC | PRN
  Administered 2017-05-31 (×2): 50 ug via INTRAVENOUS

## 2017-05-31 MED ORDER — BRIMONIDINE TARTRATE-TIMOLOL 0.2-0.5 % OP SOLN
OPHTHALMIC | Status: DC | PRN
  Administered 2017-05-31: 1 [drp] via OPHTHALMIC

## 2017-05-31 MED ORDER — ARMC OPHTHALMIC DILATING DROPS
1.0000 "application " | OPHTHALMIC | Status: DC | PRN
  Administered 2017-05-31 (×3): 1 via OPHTHALMIC

## 2017-05-31 MED ORDER — MOXIFLOXACIN HCL 0.5 % OP SOLN
OPHTHALMIC | Status: DC | PRN
Start: 2017-05-31 — End: 2017-05-31
  Administered 2017-05-31: 0.2 mL via OPHTHALMIC

## 2017-05-31 MED ORDER — ACETAMINOPHEN 160 MG/5ML PO SOLN: 325.0000 mg | ORAL | Status: DC | PRN

## 2017-05-31 MED ORDER — EPINEPHRINE PF 1 MG/ML IJ SOLN
INTRAOCULAR | Status: DC | PRN
  Administered 2017-05-31: 52 mL via OPHTHALMIC

## 2017-05-31 MED ORDER — ACETAMINOPHEN 325 MG PO TABS: 325.0000 mg | ORAL_TABLET | ORAL | Status: DC | PRN

## 2017-05-31 MED ORDER — ONDANSETRON HCL 4 MG/2ML IJ SOLN: 4.0000 mg | Freq: Once | INTRAMUSCULAR | Status: DC | PRN

## 2017-05-31 MED ORDER — LIDOCAINE HCL (PF) 2 % IJ SOLN
INTRAOCULAR | Status: DC | PRN
  Administered 2017-05-31: 1 mL via INTRAMUSCULAR

## 2017-05-31 MED ORDER — LACTATED RINGERS IV SOLN: 10.0000 mL/h | INTRAVENOUS | Status: DC

## 2017-05-31 MED ORDER — MOXIFLOXACIN HCL 0.5 % OP SOLN
1.0000 [drp] | OPHTHALMIC | Status: DC | PRN
  Administered 2017-05-31 (×3): 1 [drp] via OPHTHALMIC

## 2017-05-31 MED ORDER — MIDAZOLAM HCL 2 MG/2ML IJ SOLN
INTRAMUSCULAR | Status: DC | PRN
  Administered 2017-05-31: 2 mg via INTRAVENOUS

## 2017-05-31 SURGICAL SUPPLY — 25 items
CANNULA ANT/CHMB 27GA (MISCELLANEOUS) ×3 IMPLANT
CARTRIDGE ABBOTT (MISCELLANEOUS) IMPLANT
GLOVE SURG LX 7.5 STRW (GLOVE) ×2
GLOVE SURG LX STRL 7.5 STRW (GLOVE) ×1 IMPLANT
GLOVE SURG TRIUMPH 8.0 PF LTX (GLOVE) ×3 IMPLANT
GOWN STRL REUS W/ TWL LRG LVL3 (GOWN DISPOSABLE) ×2 IMPLANT
GOWN STRL REUS W/TWL LRG LVL3 (GOWN DISPOSABLE) ×4
LENS IOL TECNIS ITEC 21.0 (Intraocular Lens) ×3 IMPLANT
MARKER SKIN DUAL TIP RULER LAB (MISCELLANEOUS) ×3 IMPLANT
NDL RETROBULBAR .5 NSTRL (NEEDLE) IMPLANT
NEEDLE FILTER BLUNT 18X 1/2SAF (NEEDLE) ×2
NEEDLE FILTER BLUNT 18X1 1/2 (NEEDLE) ×1 IMPLANT
PACK CATARACT BRASINGTON (MISCELLANEOUS) ×3 IMPLANT
PACK EYE AFTER SURG (MISCELLANEOUS) ×3 IMPLANT
PACK OPTHALMIC (MISCELLANEOUS) ×3 IMPLANT
RING MALYGIN 7.0 (MISCELLANEOUS) IMPLANT
SUT ETHILON 10-0 CS-B-6CS-B-6 (SUTURE)
SUT VICRYL  9 0 (SUTURE)
SUT VICRYL 9 0 (SUTURE) IMPLANT
SUTURE EHLN 10-0 CS-B-6CS-B-6 (SUTURE) IMPLANT
SYR 3ML LL SCALE MARK (SYRINGE) ×3 IMPLANT
SYR 5ML LL (SYRINGE) ×3 IMPLANT
SYR TB 1ML LUER SLIP (SYRINGE) ×3 IMPLANT
WATER STERILE IRR 250ML POUR (IV SOLUTION) ×3 IMPLANT
WIPE NON LINTING 3.25X3.25 (MISCELLANEOUS) ×3 IMPLANT

## 2017-05-31 NOTE — Anesthesia Preprocedure Evaluation (Signed)
Anesthesia Evaluation  Patient identified by MRN, date of birth, ID band Patient awake    Reviewed: Allergy & Precautions, H&P , NPO status , Patient's Chart, lab work & pertinent test results  History of Anesthesia Complications (+) PONV  Airway Mallampati: III  TM Distance: >3 FB Neck ROM: full    Dental  (+) Upper Dentures, Lower Dentures   Pulmonary    Pulmonary exam normal breath sounds clear to auscultation       Cardiovascular hypertension, + CAD and + Past MI  Normal cardiovascular exam Rhythm:regular Rate:Normal     Neuro/Psych    GI/Hepatic   Endo/Other  diabetes, Poorly ControlledMorbid obesity  Renal/GU Renal disease     Musculoskeletal   Abdominal   Peds  Hematology   Anesthesia Other Findings   Reproductive/Obstetrics                             Anesthesia Physical Anesthesia Plan  ASA: III  Anesthesia Plan: MAC   Post-op Pain Management:    Induction:   PONV Risk Score and Plan: 3 and Midazolam  Airway Management Planned:   Additional Equipment:   Intra-op Plan:   Post-operative Plan:   Informed Consent: I have reviewed the patients History and Physical, chart, labs and discussed the procedure including the risks, benefits and alternatives for the proposed anesthesia with the patient or authorized representative who has indicated his/her understanding and acceptance.     Plan Discussed with: CRNA  Anesthesia Plan Comments:         Anesthesia Quick Evaluation  

## 2017-05-31 NOTE — Op Note (Signed)
LOCATION:  [facility]   PREOPERATIVE DIAGNOSIS:    Nuclear sclerotic cataract right eye. H25.11   POSTOPERATIVE DIAGNOSIS:  Nuclear sclerotic cataract right eye.     PROCEDURE:  Phacoemusification with posterior chamber intraocular lens placement of the right eye   LENS:   Implant Name Type Inv. Item Serial No. Manufacturer Lot No. LRB No. Used  LENS IOL DIOP 21.0 - [id number] Intraocular Lens LENS IOL DIOP 21.0 [id number] AMO  Right 1        ULTRASOUND TIME: 11 % of 1 minutes, 2 seconds.  CDE 49.5   SURGEON:  Wyonia Hough, MD   ANESTHESIA:  Topical with tetracaine drops and 2% Xylocaine jelly, augmented with 1% preservative-free intracameral lidocaine.    COMPLICATIONS:  None.   DESCRIPTION OF PROCEDURE:  The patient was identified in the holding room and transported to the operating room and placed in the supine position under the operating microscope.  The right eye was identified as the operative eye and it was prepped and draped in the usual sterile ophthalmic fashion.   A 1 millimeter clear-corneal paracentesis was made at the 12:00 position.  0.5 ml of preservative-free 1% lidocaine was injected into the anterior chamber. The anterior chamber was filled with Viscoat viscoelastic.  A 2.4 millimeter keratome was used to make a near-clear corneal incision at the 9:00 position.  A curvilinear capsulorrhexis was made with a cystotome and capsulorrhexis forceps.  Balanced salt solution was used to hydrodissect and hydrodelineate the nucleus.   Phacoemulsification was then used in stop and chop fashion to remove the lens nucleus and epinucleus.  The remaining cortex was then removed using the irrigation and aspiration handpiece. Provisc was then placed into the capsular bag to distend it for lens placement.  A lens was then injected into the capsular bag.  The remaining viscoelastic was aspirated.   Wounds were hydrated with balanced salt solution.  The anterior  chamber was inflated to a physiologic pressure with balanced salt solution.  No wound leaks were noted. Vigamox 0.2 ml of a 1mg  per ml solution was injected into the anterior chamber for a dose of 0.2 mg of intracameral antibiotic at the completion of the case.   Timolol and Brimonidine drops were applied to the eye.  The patient was taken to the recovery room in stable condition without complications of anesthesia or surgery.   Trew Sunde 05/31/2017, 8:07 AM

## 2017-05-31 NOTE — Anesthesia Procedure Notes (Signed)
Procedure Name: MAC Performed by: Laysa Kimmey, CRNA Pre-anesthesia Checklist: Patient identified, Emergency Drugs available, Suction available, Timeout performed and Patient being monitored Patient Re-evaluated:Patient Re-evaluated prior to induction Oxygen Delivery Method: Nasal cannula Placement Confirmation: positive ETCO2       

## 2017-05-31 NOTE — Anesthesia Postprocedure Evaluation (Signed)
Anesthesia Post Note  Patient: Melissa Christian  Procedure(s) Performed: CATARACT EXTRACTION PHACO AND INTRAOCULAR LENS PLACEMENT (IOC) RIGHT DIABETIC (Right )  Patient location during evaluation: PACU Anesthesia Type: MAC Level of consciousness: awake and alert Pain management: pain level controlled Vital Signs Assessment: post-procedure vital signs reviewed and stable Respiratory status: spontaneous breathing, nonlabored ventilation, respiratory function stable and patient connected to nasal cannula oxygen Cardiovascular status: stable and blood pressure returned to baseline Postop Assessment: no apparent nausea or vomiting Anesthetic complications: no    Raheel Kunkle ELAINE

## 2017-05-31 NOTE — Transfer of Care (Signed)
Immediate Anesthesia Transfer of Care Note  Patient: Melissa Christian  Procedure(s) Performed: CATARACT EXTRACTION PHACO AND INTRAOCULAR LENS PLACEMENT (IOC) RIGHT DIABETIC (Right )  Patient Location: PACU  Anesthesia Type: MAC  Level of Consciousness: awake, alert  and patient cooperative  Airway and Oxygen Therapy: Patient Spontanous Breathing and Patient connected to supplemental oxygen  Post-op Assessment: Post-op Vital signs reviewed, Patient's Cardiovascular Status Stable, Respiratory Function Stable, Patent Airway and No signs of Nausea or vomiting  Post-op Vital Signs: Reviewed and stable  Complications: No apparent anesthesia complications

## 2017-05-31 NOTE — H&P (Signed)
The History and Physical notes are on paper, have been signed, and are to be scanned. The patient remains stable and unchanged from the H&P.   Previous H&P reviewed, patient examined, and there are no changes.  Cobey Raineri 05/31/2017 7:39 AM

## 2017-06-02 ENCOUNTER — Encounter: Payer: Self-pay | Admitting: Ophthalmology

## 2017-06-14 ENCOUNTER — Telehealth: Payer: Self-pay | Admitting: Cardiovascular Disease

## 2017-06-14 NOTE — Telephone Encounter (Signed)
Pt states she has been extermely tired for the past 2 days, states it has not gotten any better. States she felt like she was going to pass out, but was able to get to a chair. Pt states she did not check her BP or HR. Denies any nausea, vomiting. States she has experienced some Chest pain, but "not the same when I had my stent". Denies having that pain now.

## 2017-06-15 NOTE — Telephone Encounter (Signed)
Spoke with patient and she states that she has not been feeling well and just wanted to see about coming in to be seen. She reports increased fatigue, shortness of breath, and one episode of chest pain. She has no blood pressure or heart rate readings and states that she has just not been feeling well. She states that she has not been doing her regular exercise program since November. She states that the chest pain did not last and was not the same as her pain previously when she had her stent placed. Reviewed signs and symptoms to monitor which would require immediate evaluation in the ED. Scheduled her to come in 12/17 at 2:00 PM with Christell Faith PA. She verbalized understanding of our conversation, agreement with plan, and had no further questions at this time.

## 2017-06-19 ENCOUNTER — Ambulatory Visit: Payer: Medicare Other | Admitting: Physician Assistant

## 2017-06-19 ENCOUNTER — Encounter: Payer: Self-pay | Admitting: Physician Assistant

## 2017-06-19 VITALS — BP 95/61 | HR 66 | Ht 64.0 in | Wt 255.5 lb

## 2017-06-19 DIAGNOSIS — I38 Endocarditis, valve unspecified: Secondary | ICD-10-CM | POA: Diagnosis not present

## 2017-06-19 DIAGNOSIS — N183 Chronic kidney disease, stage 3 unspecified: Secondary | ICD-10-CM

## 2017-06-19 DIAGNOSIS — E875 Hyperkalemia: Secondary | ICD-10-CM | POA: Diagnosis not present

## 2017-06-19 DIAGNOSIS — I340 Nonrheumatic mitral (valve) insufficiency: Secondary | ICD-10-CM

## 2017-06-19 DIAGNOSIS — E782 Mixed hyperlipidemia: Secondary | ICD-10-CM

## 2017-06-19 DIAGNOSIS — I951 Orthostatic hypotension: Secondary | ICD-10-CM

## 2017-06-19 DIAGNOSIS — I2510 Atherosclerotic heart disease of native coronary artery without angina pectoris: Secondary | ICD-10-CM | POA: Diagnosis not present

## 2017-06-19 NOTE — Progress Notes (Signed)
Cardiology Office Note Date:  06/19/2017  Patient ID:  Aleeah, Greeno August 16, 1940, MRN [id number] PCP:  Glendon Axe, MD  Cardiologist:  Dr. Rockey Situ, MD    Chief Complaint: Evaluation of fatigue, SOB, chest pain  History of Present Illness: Melissa Christian is a 76 y.o. female with history of CAD with NSTEMI in 09/2015 with PCI/DES to the mid LAD as detailed below, CKD stage III, DM2, HTN, HLD, aortic insufficiency, and mitral regurgitation who presents to clinic today for evaluation of fatigue, SOB, and chest pain.   She was admitted to the hospital in 09/2015 with a NSTEMI with troponin peaking at 0.86. LHC on 09/23/2015 showed left main without significant disease, mid LAD 95% s/p PCI/DES with Xience Alpine 2.75 x 12 mm DES, LCx without significant disease, RCA without significant disease. Echo on 09/23/2015 showed EF 55-60%, challenging imaging quality, unable to exclude mild mid to distal anterior wall and apical hypokinesis, normal LV diastolic function parameters, mild AI/MR, normal size of left atrium, RV systolic function normal, PASP normal. Admitted 06/2016 with cholelithiasis, underwent successful laproscopic cholecystectomy after holding Brilinta x 5 days (was continued on aspirin), followed by resumption of DAPT with ASA and Brilinta. Has undergone cataract extraction x 2 this fall. She called the office on 12/12 noting increased fatigue with possible presyncope x 2 days.   She comes in feeling well today. She does however report for the past 1.5-2 weeks she has noted positional dizziness that prompts her to need to sit down. If she stands slowly, she is less dizzy. She does not check her BP at home. Feels tired. Has had two short episodes of nonexertional chest pain the week prior that did not feel like her prior MI, none since. No recent illnesses. Today, feels better than she has in about a week, though still feels tired and notes positional dizziness.    Past Medical History:    Diagnosis Date  . Anemia   . CAD (coronary artery disease)    a. NSTEMI 09/2015: cardiac cath: LM no obs dz, mLAD 95% s/p PCI/DES 0%, LCx no obs, RCA no obs, LVEF 55-65%  . Chronic kidney disease (CKD), stage III (moderate) (HCC)   . Diabetes mellitus with complication ([place])   . HLD (hyperlipidemia)   . Hypertension   . Low blood potassium   . NSTEMI (non-ST elevated myocardial infarction) ([place])   . PONV (postoperative nausea and vomiting)   . Valvular heart disease    a. 09/2015: EF 55-60%, challenging images unable to exclude mild mid-distal anterior to apical HK, nl LV dias fxn, mild AI/MR, normal size of left atrium, RV systolic function normal, PASP normal      Past Surgical History:  Procedure Laterality Date  . BACK SURGERY    . CARDIAC CATHETERIZATION N/A 09/23/2015   Procedure: Right and Left Heart Cath;  Surgeon: Minna Merritts, MD;  Location: [facility];  Service: Cardiovascular;  Laterality: N/A;  . CARDIAC CATHETERIZATION N/A 09/23/2015   Procedure: Coronary Stent Intervention;  Surgeon: Yolonda Kida, MD;  Location: [facility];  Service: Cardiovascular;  Laterality: N/A;  . CATARACT EXTRACTION W/PHACO Left 04/26/2017   Procedure: CATARACT EXTRACTION PHACO AND INTRAOCULAR LENS PLACEMENT ([place]) LEFT DIABETIC;  Surgeon: Leandrew Koyanagi, MD;  Location: [facility];  Service: Ophthalmology;  Laterality: Left;  diabetic-oral meds  . CATARACT EXTRACTION W/PHACO Right 05/31/2017   Procedure: CATARACT EXTRACTION PHACO AND INTRAOCULAR LENS PLACEMENT (IOC) RIGHT DIABETIC;  Surgeon:  Leandrew Koyanagi, MD;  Location: [facility];  Service: Ophthalmology;  Laterality: Right;  . CHOLECYSTECTOMY N/A 06/16/2016   Procedure: LAPAROSCOPIC CHOLECYSTECTOMY WITH INTRAOPERATIVE CHOLANGIOGRAM;  Surgeon: Leonie Green, MD;  Location: [facility];  Service: General;  Laterality: N/A;  . CORONARY STENT PLACEMENT    . JOINT REPLACEMENT    . LUMBAR  FUSION    . TOTAL HIP ARTHROPLASTY      Current Meds  Medication Sig  . aspirin EC 81 MG tablet Take 81 mg by mouth daily.  . Cholecalciferol (VITAMIN D3) 2000 units TABS Take 2,000 Units by mouth.  . diltiazem (CARDIZEM CD) 240 MG 24 hr capsule Take 480 mg by mouth daily.  . Dulaglutide (TRULICITY ) Inject into the skin once a week.  . ferrous gluconate (FERGON) 324 MG tablet Take 324 mg by mouth 2 (two) times daily with a meal.   . furosemide (LASIX) 40 MG tablet Take 40 mg by mouth daily as needed for edema.   Marland Kitchen glipiZIDE (GLUCOTROL) 10 MG tablet Take 20 mg by mouth 2 (two) times daily before a meal.  . hydrochlorothiazide (HYDRODIURIL) 25 MG tablet Take 25 mg by mouth daily.  . metoprolol succinate (TOPROL-XL) 100 MG 24 hr tablet Take 100 mg by mouth daily. Take with or immediately following a meal.  . potassium chloride (K-DUR,KLOR-CON) 10 MEQ tablet Take 10 mEq by mouth as needed (when taking Lasix).   . rosuvastatin (CRESTOR) 40 MG tablet Take 1 tablet (40 mg total) by mouth daily.  . [DISCONTINUED] losartan (COZAAR) 100 MG tablet Take 100 mg by mouth daily.    Allergies:   Ferrous sulfate; Keflex [cephalexin]; Percocet [oxycodone-acetaminophen]; Vicodin [hydrocodone-acetaminophen]; Ace inhibitors; and Penicillins   Social History:  The patient  reports that  has never smoked. she has never used smokeless tobacco. She reports that she does not drink alcohol or use drugs.   Family History:  The patient's family history includes Diabetes in her mother; Heart disease in her father; Hypertension in her mother.  ROS:   Review of Systems  Constitutional: Positive for malaise/fatigue. Negative for chills, diaphoresis, fever and weight loss.  HENT: Negative for congestion.   Eyes: Negative for discharge and redness.  Respiratory: Positive for shortness of breath. Negative for cough, hemoptysis, sputum production and wheezing.   Cardiovascular: Positive for chest pain. Negative for  palpitations, orthopnea, claudication, leg swelling and PND.  Gastrointestinal: Negative for abdominal pain, blood in stool, heartburn, melena, nausea and vomiting.  Genitourinary: Negative for hematuria.  Musculoskeletal: Negative for falls and myalgias.  Skin: Negative for rash.  Neurological: Positive for dizziness and weakness. Negative for tingling, tremors, sensory change, speech change, focal weakness, seizures, loss of consciousness and headaches.  Endo/Heme/Allergies: Does not bruise/bleed easily.  Psychiatric/Behavioral: Negative for substance abuse. The patient is not nervous/anxious.   All other systems reviewed and are negative.    PHYSICAL EXAM:  VS:  BP 95/61 (BP Location: Left Arm, Patient Position: Sitting, Cuff Size: Large)   Pulse 66   Ht 5\' 4"  (1.626 m)   Wt 255 lb 8 oz (115.9 kg)   BMI 43.86 kg/m  BMI: Body mass index is 43.86 kg/m.  Physical Exam  Constitutional: She is oriented to person, place, and time. She appears well-developed and well-nourished.  HENT:  Head: Normocephalic and atraumatic.  Eyes: Right eye exhibits no discharge. Left eye exhibits no discharge.  Neck: Normal range of motion. No JVD present.  Cardiovascular: Normal rate, regular rhythm, S1 normal and  S2 normal. Exam reveals no distant heart sounds, no friction rub, no midsystolic click and no opening snap.  Murmur heard. High-pitched blowing holosystolic murmur is present with a grade of 1/6 at the apex. High-pitched blowing decrescendo early diastolic murmur is present with a grade of 1/6 at the upper right sternal border radiating to the apex. Pulses:      Posterior tibial pulses are 2+ on the right side, and 2+ on the left side.  Pulmonary/Chest: Effort normal and breath sounds normal. No respiratory distress. She has no decreased breath sounds. She has no wheezes. She has no rales. She exhibits no tenderness.  Abdominal: Soft. She exhibits no distension. There is no tenderness.    Musculoskeletal: She exhibits no edema.  Neurological: She is alert and oriented to person, place, and time.  Skin: Skin is warm and dry. No cyanosis. Nails show no clubbing.  Psychiatric: She has a normal mood and affect. Her speech is normal and behavior is normal. Judgment and thought content normal.      EKG:  Was ordered and interpreted by me today. Shows NSR, 66 bpm, bifascicular block  Recent Labs: Reviewed labs from [facility] drawn 04/2017.   Wt Readings from Last 3 Encounters:  06/19/17 255 lb 8 oz (115.9 kg)  05/31/17 254 lb (115.2 kg)  05/22/17 256 lb 8 oz (116.3 kg)     Other studies reviewed: Additional studies/records reviewed today include: summarized above  ASSESSMENT AND PLAN:  1. Fatigue/dizziness/orthostatic hypotension: Likely in the setting of her hypotension. Dizziness is positional. Stop losartan. For now, continue Toprol and HCTZ (may need to stop HCTZ pending renal function/potassium). Check echo. Recent cbc showed hgb at ~ baseline. Increase fluids. No symptoms of palpitations. If symptoms persist despite the above changes/workup consider outpatient cardiac monitoring.   2. CAD in native coronary artery without angina: Had two brief episodes of nonexertional chest pain that were short-lived the week prior. None since. These did not feel like her prior MI. Currently, asymptomatic. Stop losartan as detailed above and below. If she has return of symptoms consider Lexiscan Myoview. Continue ASA, Toprol, and Crestor. Aggressive risk factor modification.   3. Valvular heart disease: Schedule echocardiogram.   4. HLD: Continue Crestor. Last LDL of 66 in 04/2017 with normal LFT [facility]).   5. HTN: Blood pressure soft at triage (90/60). Recheck 95/61. Stop losartan. RN visit in 1 week to recheck BP. Continue Toprol and HCTZ (for now).    6. CKD stage III: SCr 1.8 in 04/2017 in [facility], (baseline ~ 1.4-1.6). Check bmet. Followed by PCP.    7. Hyperkalemia: Potassium noted to be 5.4 on 05/01/2017. Recheck level today.   8. DM2: Last A1c of 8.7 in 04/2017. Per PCP.   Disposition: F/u with me in 2 weeks.   Current medicines are reviewed at length with the patient today.  The patient did not have any concerns regarding medicines.  Signed, Christell Faith, PA-C 06/19/2017 3:24 PM     [address] [place], [place] [address] [phone]

## 2017-06-19 NOTE — Patient Instructions (Addendum)
Medication Instructions:  Your physician has recommended you make the following change in your medication:  STOP taking losartan   Labwork: BMET today  Testing/Procedures: Your physician has requested that you have an echocardiogram. Echocardiography is a painless test that uses sound waves to create images of your heart. It provides your doctor with information about the size and shape of your heart and how well your heart's chambers and valves are working. This procedure takes approximately one hour. There are no restrictions for this procedure.  Follow-Up: Your physician recommends that you schedule a nurse visit for BP check in one week Your physician recommends that you schedule a follow-up appointment in 2-3 weeks with Christell Faith, PA-C.    Any Other Special Instructions Will Be Listed Below (If Applicable).     If you need a refill on your cardiac medications before your next appointment, please call your pharmacy.

## 2017-06-20 ENCOUNTER — Other Ambulatory Visit: Payer: Self-pay

## 2017-06-20 DIAGNOSIS — I10 Essential (primary) hypertension: Secondary | ICD-10-CM

## 2017-06-20 LAB — BASIC METABOLIC PANEL
BUN/Creatinine Ratio: 19 (ref 12–28)
BUN: 39 mg/dL — AB (ref 8–27)
CALCIUM: 9.1 mg/dL (ref 8.7–10.3)
CHLORIDE: 100 mmol/L (ref 96–106)
CO2: 21 mmol/L (ref 20–29)
Creatinine, Ser: 2.04 mg/dL — ABNORMAL HIGH (ref 0.57–1.00)
GFR calc Af Amer: 27 mL/min/{1.73_m2} — ABNORMAL LOW (ref >59)
GFR, EST NON AFRICAN AMERICAN: 23 mL/min/{1.73_m2} — AB (ref >59)
GLUCOSE: 160 mg/dL — AB (ref 65–99)
POTASSIUM: 4.2 mmol/L (ref 3.5–5.2)
Sodium: 140 mmol/L (ref 134–144)

## 2017-06-21 ENCOUNTER — Ambulatory Visit
Admission: RE | Admit: 2017-06-21 | Discharge: 2017-06-21 | Disposition: A | Discharge status: Home / Self Care | Payer: Medicare Other | Source: Ambulatory Visit | Attending: Internal Medicine | Admitting: Internal Medicine

## 2017-06-21 DIAGNOSIS — Z1231 Encounter for screening mammogram for malignant neoplasm of breast: Secondary | ICD-10-CM | POA: Diagnosis present

## 2017-06-28 ENCOUNTER — Other Ambulatory Visit (INDEPENDENT_AMBULATORY_CARE_PROVIDER_SITE_OTHER): Payer: Medicare Other

## 2017-06-28 ENCOUNTER — Ambulatory Visit (INDEPENDENT_AMBULATORY_CARE_PROVIDER_SITE_OTHER): Payer: Medicare Other | Admitting: *Deleted

## 2017-06-28 VITALS — BP 124/64 | HR 68 | Ht 64.0 in | Wt 255.8 lb

## 2017-06-28 DIAGNOSIS — I959 Hypotension, unspecified: Secondary | ICD-10-CM | POA: Diagnosis not present

## 2017-06-28 DIAGNOSIS — I10 Essential (primary) hypertension: Secondary | ICD-10-CM | POA: Diagnosis not present

## 2017-06-28 NOTE — Progress Notes (Signed)
1.) Reason for visit: follow up BP check  2.) Name of MD requesting visit: Christell Faith, PA  3.) H&P: Patient last seen in the office on 06/19/17 with Christell Faith, PA. The patient's BP at that time was 95/61. The patient reports that she felt "terrible" at that time. Losartan was discontinue at that visit and per the patient, she was contacted by our office the next day to also stop HCTZ- this was done due to declining renal function.  4.) ROS related to problem: Patient here today for a follow up BP check- BP 124/64 HR- 68 in the office today. She sates she is feeling so much better since her losartan and HCTZ were stopped. She also brings in her BP readings from home:   06/24/17 (3:30 pm)- 159/67 (62) 06/24/17 (6:30 pm)- 165/71 (69) 06/25/17 - 146/64 (70) 06/25/17- 154/62 (67) 06/26/17- 120/74 (82) 06/26/17- 165/60 (72) 06/27/17- 148/71 (82)- no meds 06/27/17- 156/73 (82) - after meds 06/28/17- 126/60 (78)- after meds   5.) Assessment and plan per MD: Will forward to Christell Faith, PA to review. The patient also had her BMP repeated today.

## 2017-06-28 NOTE — Patient Instructions (Addendum)
Medication Instructions: - Your physician recommends that you continue on your current medications as directed. Please refer to the Current Medication list given to you today.  Labwork: - today as scheduled- BMP  Procedures/Testing: - none ordered  Follow-Up: - I will call you back later today if changes are needed after the PA reviews.  Any Additional Special Instructions Will Be Listed Below (If Applicable).     If you need a refill on your cardiac medications before your next appointment, please call your pharmacy.

## 2017-06-29 LAB — BASIC METABOLIC PANEL
BUN/Creatinine Ratio: 15 (ref 12–28)
BUN: 25 mg/dL (ref 8–27)
CALCIUM: 9 mg/dL (ref 8.7–10.3)
CO2: 22 mmol/L (ref 20–29)
CREATININE: 1.69 mg/dL — AB (ref 0.57–1.00)
Chloride: 101 mmol/L (ref 96–106)
GFR calc Af Amer: 33 mL/min/{1.73_m2} — ABNORMAL LOW (ref >59)
GFR, EST NON AFRICAN AMERICAN: 29 mL/min/{1.73_m2} — AB (ref >59)
GLUCOSE: 249 mg/dL — AB (ref 65–99)
Potassium: 5.1 mmol/L (ref 3.5–5.2)
SODIUM: 140 mmol/L (ref 134–144)

## 2017-07-03 ENCOUNTER — Other Ambulatory Visit: Payer: Self-pay

## 2017-07-03 ENCOUNTER — Ambulatory Visit (INDEPENDENT_AMBULATORY_CARE_PROVIDER_SITE_OTHER): Payer: Medicare Other

## 2017-07-03 DIAGNOSIS — I340 Nonrheumatic mitral (valve) insufficiency: Secondary | ICD-10-CM

## 2017-07-06 NOTE — Progress Notes (Signed)
Cardiology Office Note Date:  07/11/2017  Patient ID:  Melissa, Christian December 04, 1940, MRN [id number] PCP:  Glendon Axe, MD  Cardiologist:  Dr. Rockey Situ, MD    Chief Complaint: Follow up  History of Present Illness: Melissa Christian is a 77 y.o. female with history of CAD with NSTEMI in 09/2015 with PCI/DES to the mid LAD as detailed below, CKD stage III, DM2, HTN, HLD, aortic insufficiency, and mitral regurgitation who presents to clinic today for follow up of blood pressure/hypotension/fatigue.  She was admitted to the hospital in 09/2015 with a NSTEMI with troponin peaking at 0.86. LHC on 09/23/2015 showed left main without significant disease, mid LAD 95% s/p PCI/DES with Xience Alpine 2.75 x 12 mm DES, LCx without significant disease, RCA without significant disease. Echo on 09/23/2015 showed EF 55-60%, challenging imaging quality, unable to exclude mild mid to distal anterior wall and apical hypokinesis, normal LV diastolic function parameters, mild AI/MR, normal size of left atrium, RV systolic function normal, PASP normal. Admitted 06/2016 with cholelithiasis, underwent successful laproscopic cholecystectomy after holding Brilinta x 5 days (was continued on aspirin), followed by resumption of DAPT with ASA and Brilinta. Has undergone cataract extraction x 2 in the fall of 2018. She called the office on 12/12 noting increased fatigue with possible presyncope x 2 days and was seen in the office on 06/19/17 with positional dizziness, increased fatigue and atypical chest pain.  She was noted to be hypotensive at that time with a blood pressure of 95/61. Her losartan and HCTZ were stopped 2/2 soft BP and bmet that showed worsening SCr to 2.04 (baseline ~ 1.5-1.7) and a BUN of 39. RN visit on 12/26 showed a BP of 124/64 with a heart rate of 68 bpm. She reported significant improvement in her symptoms since stopping losartan and HCTZ. Blood pressure readings from home ranged from 120-165/60-74. Repeat  bmet on 12/26 showed improved SCr back to her ~ baseline at 1.69 and BUN improved to 25. Potassium was on the high end of normal at 5.1. Echo performed on 07/03/17 showed an EF of 60-65%, no RWMA, Gr1DD, mild aortic insufficiency, mild mitral regurgitation, normal RV systolic function, PASP normal.   She comes in doing well today. Since stopping both her HCTZ and losartan she has noted a significant improvement in her energy and less SOB. Blood pressure readings at home have ranged from the 378H to 885O systolic with heart rates in the 70s bpm. She is pleased with her improvement. She does mention an occasional "sensation" in her chest. This has happened at nighttime, waking her and has also happened when she is ambulating. She cannot describe this any further and does not describe it as pain, discomfort, or palpitations. It has been associated with SOB. Episodes will last several seconds and self resolve. Episodes are intermittent, occurring 1-2 per week.   Past Medical History:  Diagnosis Date  . Anemia   . CAD (coronary artery disease)    a. NSTEMI 09/2015: cardiac cath: LM no obs dz, mLAD 95% s/p PCI/DES 0%, LCx no obs, RCA no obs, LVEF 55-65%  . Chronic kidney disease (CKD), stage III (moderate) (HCC)   . Diabetes mellitus with complication ([place])   . HLD (hyperlipidemia)   . Hypertension   . Low blood potassium   . NSTEMI (non-ST elevated myocardial infarction) ([place])   . PONV (postoperative nausea and vomiting)   . Valvular heart disease    a. 09/2015: EF 55-60%, challenging images unable to exclude  mild mid-distal anterior to apical HK, nl LV dias fxn, mild AI/MR, normal size of left atrium, RV systolic function normal, PASP normal      Past Surgical History:  Procedure Laterality Date  . BACK SURGERY    . CARDIAC CATHETERIZATION N/A 09/23/2015   Procedure: Right and Left Heart Cath;  Surgeon: Minna Merritts, MD;  Location: [facility];  Service: Cardiovascular;  Laterality:  N/A;  . CARDIAC CATHETERIZATION N/A 09/23/2015   Procedure: Coronary Stent Intervention;  Surgeon: Yolonda Kida, MD;  Location: [facility];  Service: Cardiovascular;  Laterality: N/A;  . CATARACT EXTRACTION W/PHACO Left 04/26/2017   Procedure: CATARACT EXTRACTION PHACO AND INTRAOCULAR LENS PLACEMENT ([place]) LEFT DIABETIC;  Surgeon: Leandrew Koyanagi, MD;  Location: [facility];  Service: Ophthalmology;  Laterality: Left;  diabetic-oral meds  . CATARACT EXTRACTION W/PHACO Right 05/31/2017   Procedure: CATARACT EXTRACTION PHACO AND INTRAOCULAR LENS PLACEMENT ([place]) RIGHT DIABETIC;  Surgeon: Leandrew Koyanagi, MD;  Location: [facility];  Service: Ophthalmology;  Laterality: Right;  . CHOLECYSTECTOMY N/A 06/16/2016   Procedure: LAPAROSCOPIC CHOLECYSTECTOMY WITH INTRAOPERATIVE CHOLANGIOGRAM;  Surgeon: Leonie Green, MD;  Location: [facility];  Service: General;  Laterality: N/A;  . CORONARY STENT PLACEMENT    . JOINT REPLACEMENT    . LUMBAR FUSION    . TOTAL HIP ARTHROPLASTY      Current Meds  Medication Sig  . aspirin EC 81 MG tablet Take 81 mg by mouth daily.  . Cholecalciferol (VITAMIN D3) 2000 units TABS Take 2,000 Units by mouth.  . diltiazem (CARDIZEM CD) 240 MG 24 hr capsule Take 480 mg by mouth daily.  . Dulaglutide (TRULICITY [place]) Inject into the skin once a week.  . furosemide (LASIX) 40 MG tablet Take 40 mg by mouth daily as needed for edema.   Marland Kitchen glipiZIDE (GLUCOTROL) 10 MG tablet Take 20 mg by mouth 2 (two) times daily before a meal.  . metoprolol succinate (TOPROL-XL) 100 MG 24 hr tablet Take 100 mg by mouth daily. Take with or immediately following a meal.  . potassium chloride (K-DUR,KLOR-CON) 10 MEQ tablet Take 10 mEq by mouth as needed (when taking Lasix).   . rosuvastatin (CRESTOR) 40 MG tablet Take 1 tablet (40 mg total) by mouth daily.    Allergies:   Ferrous sulfate; Keflex [cephalexin]; Percocet [oxycodone-acetaminophen]; Vicodin  [hydrocodone-acetaminophen]; Ace inhibitors; and Penicillins   Social History:  The patient  reports that  has never smoked. she has never used smokeless tobacco. She reports that she does not drink alcohol or use drugs.   Family History:  The patient's family history includes Diabetes in her mother; Heart disease in her father; Hypertension in her mother.  ROS:   Review of Systems  Constitutional: Positive for malaise/fatigue. Negative for chills, diaphoresis, fever and weight loss.  HENT: Negative for congestion.   Eyes: Negative for discharge and redness.  Respiratory: Negative for cough, hemoptysis, sputum production, shortness of breath and wheezing.   Cardiovascular: Negative for chest pain, palpitations, orthopnea, claudication, leg swelling and PND.  Gastrointestinal: Negative for abdominal pain, blood in stool, heartburn, melena, nausea and vomiting.  Genitourinary: Negative for hematuria.  Musculoskeletal: Negative for falls and myalgias.  Skin: Negative for rash.  Neurological: Negative for dizziness, tingling, tremors, sensory change, speech change, focal weakness, loss of consciousness and weakness.  Endo/Heme/Allergies: Does not bruise/bleed easily.  Psychiatric/Behavioral: Negative for substance abuse. The patient is nervous/anxious.   All other systems reviewed and are negative.  PHYSICAL EXAM:  VS:  BP 140/70 (BP Location: Left Arm, Patient Position: Sitting, Cuff Size: Normal)   Pulse 81   Ht 5\' 4"  (1.626 m)   Wt 256 lb 8 oz (116.3 kg)   SpO2 98%   BMI 44.03 kg/m  BMI: Body mass index is 44.03 kg/m.  Physical Exam  Constitutional: She is oriented to person, place, and time. She appears well-developed and well-nourished.  HENT:  Head: Normocephalic and atraumatic.  Eyes: Right eye exhibits no discharge. Left eye exhibits no discharge.  Neck: Normal range of motion. No JVD present.  Cardiovascular: Normal rate, regular rhythm, S1 normal, S2 normal and normal  heart sounds. Exam reveals no distant heart sounds, no friction rub, no midsystolic click and no opening snap.  No murmur heard. Pulses:      Posterior tibial pulses are 2+ on the right side, and 2+ on the left side.  Pulmonary/Chest: Effort normal and breath sounds normal. No respiratory distress. She has no decreased breath sounds. She has no wheezes. She has no rales. She exhibits no tenderness.  Abdominal: Soft. She exhibits no distension. There is no tenderness.  Musculoskeletal: She exhibits no edema.  Neurological: She is alert and oriented to person, place, and time.  Skin: Skin is warm and dry. No cyanosis. Nails show no clubbing.  Psychiatric: She has a normal mood and affect. Her speech is normal and behavior is normal. Judgment and thought content normal.     EKG:  Was not ordered.  Recent Labs: 06/28/2017: BUN 25; Creatinine, Ser 1.69; Potassium 5.1; Sodium 140  No results found for requested labs within last 8760 hours.   Estimated Creatinine Clearance: 34.9 mL/min (A) (by C-G formula based on SCr of 1.69 mg/dL (H)).   Wt Readings from Last 3 Encounters:  07/11/17 256 lb 8 oz (116.3 kg)  06/28/17 255 lb 12 oz (116 kg)  06/19/17 255 lb 8 oz (115.9 kg)     Other studies reviewed: Additional studies/records reviewed today include: summarized above  ASSESSMENT AND PLAN:  1. SOB: She describes intermittent episodes of SOB that occur at rest and are also associated with exertion. She denies any chest pain or palpitations. Schedule Lexiscan Myoview to evaluate for high-risk ischemia. If this is normal, consider outpatient cardiac monitoring. She has not been venturing out of her house much lately as she has been afraid some bad might happen while she is away from her house. Recommend she life her life as tolerated and not stay bound to her house. Cannot rule out some degree of mild underlying anxiety.   2. Fatigue/orthostatic hypotension: Much improved. Suspect her symptoms  were related to her BP readings in the 96V systolic. Schedule Lexiscan Myoview as above.   3. CAD in native coronary artery without angina: Schedule Lexiscan Myoview as above. Continue ASA, Toprol XL, and Crestor.   4. Diastolic dysfunction: She does not appear volume overloaded at this time. Continue prn Lasix.   5. HTN: Well controlled as above. No changes as her home readings have been well controlled and she has history of orthostatic hypotension. She will call if her readings start to trend > 893 mmHg systolic.   6. Valvular heart disease: Mild AI/MR by echo 06/2017. Follow clinically with periodic echo.   Disposition: F/u with me in 1 month.   Current medicines are reviewed at length with the patient today.  The patient did not have any concerns regarding medicines.  Melvern Banker PA-C 07/11/2017 2:01 PM  [address] [place], [place] [address] [phone]

## 2017-07-11 ENCOUNTER — Ambulatory Visit: Payer: Medicare Other | Admitting: Physician Assistant

## 2017-07-11 ENCOUNTER — Encounter: Payer: Self-pay | Admitting: Physician Assistant

## 2017-07-11 VITALS — BP 140/70 | HR 81 | Ht 64.0 in | Wt 256.5 lb

## 2017-07-11 DIAGNOSIS — I38 Endocarditis, valve unspecified: Secondary | ICD-10-CM | POA: Diagnosis not present

## 2017-07-11 DIAGNOSIS — I2510 Atherosclerotic heart disease of native coronary artery without angina pectoris: Secondary | ICD-10-CM

## 2017-07-11 DIAGNOSIS — R0602 Shortness of breath: Secondary | ICD-10-CM

## 2017-07-11 DIAGNOSIS — I5189 Other ill-defined heart diseases: Secondary | ICD-10-CM

## 2017-07-11 DIAGNOSIS — I951 Orthostatic hypotension: Secondary | ICD-10-CM | POA: Diagnosis not present

## 2017-07-11 DIAGNOSIS — I10 Essential (primary) hypertension: Secondary | ICD-10-CM | POA: Diagnosis not present

## 2017-07-11 DIAGNOSIS — I519 Heart disease, unspecified: Secondary | ICD-10-CM | POA: Diagnosis not present

## 2017-07-11 NOTE — Patient Instructions (Addendum)
Medication Instructions:  Your physician recommends that you continue on your current medications as directed. Please refer to the Current Medication list given to you today.   Labwork: none  Testing/Procedures: Your physician has requested that you have a lexiscan myoview. For further information please visit [email or web address]. Please follow instruction sheet, as given.  [place]  Your caregiver has ordered a Stress Test with nuclear imaging. The purpose of this test is to evaluate the blood supply to your heart muscle. This procedure is referred to as a "Non-Invasive Stress Test." This is because other than having an IV started in your vein, nothing is inserted or "invades" your body. Cardiac stress tests are done to find areas of poor blood flow to the heart by determining the extent of coronary artery disease (CAD). Some patients exercise on a treadmill, which naturally increases the blood flow to your heart, while others who are  unable to walk on a treadmill due to physical limitations have a pharmacologic/chemical stress agent called Lexiscan . This medicine will mimic walking on a treadmill by temporarily increasing your coronary blood flow.   Please note: these test may take anywhere between 2-4 hours to complete  PLEASE REPORT TO [facility] AT THE FIRST DESK WILL DIRECT YOU WHERE TO GO  Date of Procedure:________01/15/19____________  Arrival Time for Procedure:________07:45 am_______  Instructions regarding medication:   _xx___ : Hold diabetes medication (Glipizide) morning of procedure  _xx___:  Hold lasix the morning of procedure    PLEASE NOTIFY THE OFFICE AT LEAST 24 HOURS IN ADVANCE IF YOU ARE UNABLE TO KEEP YOUR APPOINTMENT.  [phone] AND  PLEASE NOTIFY NUCLEAR MEDICINE AT [facility] AT LEAST 24 HOURS IN ADVANCE IF YOU ARE UNABLE TO KEEP YOUR APPOINTMENT. [phone]  How to prepare for your Myoview test:  1. Do not eat or  drink after midnight 2. No caffeine for 24 hours prior to test 3. No smoking 24 hours prior to test. 4. Your medication may be taken with water.  If your doctor stopped a medication because of this test, do not take that medication. 5. Ladies, please do not wear dresses.  Skirts or pants are appropriate. Please wear a short sleeve shirt. 6. No perfume, cologne or lotion. 7. Wear comfortable walking shoes. No heels!            Follow-Up: Your physician recommends that you schedule a follow-up appointment in: 1 month with Christell Faith, PA-C   Any Other Special Instructions Will Be Listed Below (If Applicable).     If you need a refill on your cardiac medications before your next appointment, please call your pharmacy.  Cardiac Nuclear Scan A cardiac nuclear scan is a test that measures blood flow to the heart when a person is resting and when he or she is exercising. The test looks for problems such as:  Not enough blood reaching a portion of the heart.  The heart muscle not working normally.  You may need this test if:  You have heart disease.  You have had abnormal lab results.  You have had heart surgery or angioplasty.  You have chest pain.  You have shortness of breath.  In this test, a radioactive dye (tracer) is injected into your bloodstream. After the tracer has traveled to your heart, an imaging device is used to measure how much of the tracer is absorbed by or distributed to various areas of your heart. This procedure is usually done at a hospital  and takes 2-4 hours. Tell a health care provider about:  Any allergies you have.  All medicines you are taking, including vitamins, herbs, eye drops, creams, and over-the-counter medicines.  Any problems you or family members have had with the use of anesthetic medicines.  Any blood disorders you have.  Any surgeries you have had.  Any medical conditions you have.  Whether you are pregnant or may be  pregnant. What are the risks? Generally, this is a safe procedure. However, problems may occur, including:  Serious chest pain and heart attack. This is only a risk if the stress portion of the test is done.  Rapid heartbeat.  Sensation of warmth in your chest. This usually passes quickly.  What happens before the procedure?  Ask your health care provider about changing or stopping your regular medicines. This is especially important if you are taking diabetes medicines or blood thinners.  Remove your jewelry on the day of the procedure. What happens during the procedure?  An IV tube will be inserted into one of your veins.  Your health care provider will inject a small amount of radioactive tracer through the tube.  You will wait for 20-40 minutes while the tracer travels through your bloodstream.  Your heart activity will be monitored with an electrocardiogram (ECG).  You will lie down on an exam table.  Images of your heart will be taken for about 15-20 minutes.  You may be asked to exercise on a treadmill or stationary bike. While you exercise, your heart's activity will be monitored with an ECG, and your blood pressure will be checked. If you are unable to exercise, you may be given a medicine to increase blood flow to parts of your heart.  When blood flow to your heart has peaked, a tracer will again be injected through the IV tube.  After 20-40 minutes, you will get back on the exam table and have more images taken of your heart.  When the procedure is over, your IV tube will be removed. The procedure may vary among health care providers and hospitals. Depending on the type of tracer used, scans may need to be repeated 3-4 hours later. What happens after the procedure?  Unless your health care provider tells you otherwise, you may return to your normal schedule, including diet, activities, and medicines.  Unless your health care provider tells you otherwise, you may  increase your fluid intake. This will help flush the contrast dye from your body. Drink enough fluid to keep your urine clear or pale yellow.  It is up to you to get your test results. Ask your health care provider, or the department that is doing the test, when your results will be ready. Summary  A cardiac nuclear scan measures the blood flow to the heart when a person is resting and when he or she is exercising.  You may need this test if you are at risk for heart disease.  Tell your health care provider if you are pregnant.  Unless your health care provider tells you otherwise, increase your fluid intake. This will help flush the contrast dye from your body. Drink enough fluid to keep your urine clear or pale yellow. This information is not intended to replace advice given to you by your health care provider. Make sure you discuss any questions you have with your health care provider. Document Released: 07/15/2004 Document Revised: 06/22/2016 Document Reviewed: 05/29/2013 Elsevier Interactive Patient Education  2017 [employer].

## 2017-07-18 ENCOUNTER — Ambulatory Visit
Admission: RE | Admit: 2017-07-18 | Discharge: 2017-07-18 | Disposition: A | Discharge status: Home / Self Care | Payer: Medicare Other | Source: Ambulatory Visit | Attending: Physician Assistant | Admitting: Physician Assistant

## 2017-07-18 DIAGNOSIS — R0602 Shortness of breath: Secondary | ICD-10-CM

## 2017-07-18 LAB — NM MYOCAR MULTI W/SPECT W/WALL MOTION / EF
CHL CUP RESTING HR STRESS: 78 {beats}/min
CHL CUP STRESS STAGE 2 HR: 77 {beats}/min
CHL CUP STRESS STAGE 3 HR: 90 {beats}/min
CHL CUP STRESS STAGE 3 SPEED: 0 mph
CHL CUP STRESS STAGE 4 DBP: 52 mmHg
CHL CUP STRESS STAGE 4 GRADE: 0 %
CHL CUP STRESS STAGE 4 SBP: 122 mmHg
CHL CUP STRESS STAGE 4 SPEED: 0 mph
CSEPEW: 1 METS
LV dias vol: 58 mL (ref 46–106)
LV sys vol: 22 mL
Peak HR: 90 {beats}/min
Percent HR: 68 %
Percent of predicted max HR: 62 %
Stage 1 Grade: 0 %
Stage 1 HR: 77 {beats}/min
Stage 1 Speed: 0 mph
Stage 2 Grade: 0 %
Stage 2 Speed: 0 mph
Stage 3 Grade: 0 %
Stage 4 HR: 94 {beats}/min
TID: 0.83

## 2017-07-18 MED ORDER — TECHNETIUM TC 99M TETROFOSMIN IV KIT
31.3750 | PACK | Freq: Once | INTRAVENOUS | Status: AC | PRN
  Administered 2017-07-18: 31.375 via INTRAVENOUS

## 2017-07-18 MED ORDER — TECHNETIUM TC 99M TETROFOSMIN IV KIT
12.8070 | PACK | Freq: Once | INTRAVENOUS | Status: AC | PRN
  Administered 2017-07-18: 12.807 via INTRAVENOUS

## 2017-07-18 MED ORDER — REGADENOSON 0.4 MG/5ML IV SOLN
0.4000 mg | Freq: Once | INTRAVENOUS | Status: AC
  Administered 2017-07-18: 0.4 mg via INTRAVENOUS

## 2017-08-08 NOTE — Progress Notes (Signed)
Cardiology Office Note Date:  08/09/2017  Patient ID:  Melissa Christian, Melissa Christian January 01, 1941, MRN [id number] PCP:  Glendon Axe, MD  Cardiologist:  Dr. Rockey Situ, MD    Chief Complaint: Follow up  History of Present Illness: Melissa Christian is a 77 y.o. female with history of CAD with NSTEMI in 09/2015 with PCI/DES to the mid LAD as detailed below, CKD stage III, DM2, HTN, HLD, aortic insufficiency, and mitral regurgitation who presents to clinic today for follow up of Myoview.  She was admitted to the hospital in 09/2015 with a NSTEMI with troponin peaking at 0.86. LHC on 09/23/2015 showed left main without significant disease, mid LAD 95% s/p PCI/DES with Xience Alpine 2.75 x 12 mm DES, LCx without significant disease, RCA without significant disease. Echo on 09/23/2015 showed EF 55-60%, challenging imaging quality, unable to exclude mild mid to distal anterior wall and apical hypokinesis, normal LV diastolic function parameters, mild AI/MR, normal size of left atrium, RV systolic function normal, PASP normal. Admitted 06/2016 with cholelithiasis, underwent successful laproscopic cholecystectomy after holding Brilinta x 5 days (was continued on aspirin), followed by resumption of DAPT with ASA and Brilinta. Has undergone cataract extraction x 2 in the fall of 2018. She called the office on 12/12 noting increased fatigue with possible presyncope x 2 days and was seen in the office on 06/19/17 with positional dizziness, increased fatigue and atypical chest pain.  She was noted to be hypotensive at that time with a blood pressure of 95/61. Her losartan and HCTZ were stopped 2/2 soft BP and bmet that showed worsening SCr to 2.04 (baseline ~ 1.5-1.7) and a BUN of 39. RN visit on 12/26 showed a BP of 124/64 with a heart rate of 68 bpm. She reported significant improvement in her symptoms since stopping losartan and HCTZ. Blood pressure readings from home ranged from 120-165/60-74. Repeat bmet on 12/26 showed improved  SCr back to her ~ baseline at 1.69 and BUN improved to 25. Potassium was on the high end of normal at 5.1. Echo performed on 07/03/17 showed an EF of 60-65%, no RWMA, Gr1DD, mild aortic insufficiency, mild mitral regurgitation, normal RV systolic function, PASP normal. I last saw her on 07/11/2017 and she was doing well. She continued to note significant improvement in her energy and SOB since stopping both her HCTZ and losartan. Blood pressure readings at home ranged from the 628M to 381R systolic with heart rates in the 70s bpm. She mentioned an occasional "sensation" in her chest that happened at nighttime, waking her and has also happened when she was walking. She could not describe this sensation any further. She underwent Lexiscan Myoview on 07/18/17 that was negative for significant ischemia or scar, EF >65%, low risk study.   She comes in doing well today. No further chest pain. No SOB. Still with some weakness/low back pain. She feels like this is mostly from deconditioning. Plans to go back to the gym with her son. Blood pressure running in the 130s-150s/70s-80s with heart rates in the 70s-80s bpm. Taking HCTZ 25 mg daily, Cardizem 480 mg daily, Toprol XL 100 mg daily. Has not needed any prn Lasix.    Past Medical History:  Diagnosis Date  . Anemia   . CAD (coronary artery disease)    a. NSTEMI 09/2015: cardiac cath: LM no obs dz, mLAD 95% s/p PCI/DES 0%, LCx no obs, RCA no obs, LVEF 55-65%  . Chronic kidney disease (CKD), stage III (moderate) (HCC)   . Diabetes  mellitus with complication ([place])   . HLD (hyperlipidemia)   . Hypertension   . Low blood potassium   . NSTEMI (non-ST elevated myocardial infarction) ([place])   . PONV (postoperative nausea and vomiting)   . Valvular heart disease    a. 09/2015: EF 55-60%, challenging images unable to exclude mild mid-distal anterior to apical HK, nl LV dias fxn, mild AI/MR, normal size of left atrium, RV systolic function normal, PASP normal      Past  Surgical History:  Procedure Laterality Date  . BACK SURGERY    . CARDIAC CATHETERIZATION N/A 09/23/2015   Procedure: Right and Left Heart Cath;  Surgeon: Minna Merritts, MD;  Location: [facility];  Service: Cardiovascular;  Laterality: N/A;  . CARDIAC CATHETERIZATION N/A 09/23/2015   Procedure: Coronary Stent Intervention;  Surgeon: Yolonda Kida, MD;  Location: [facility];  Service: Cardiovascular;  Laterality: N/A;  . CATARACT EXTRACTION W/PHACO Left 04/26/2017   Procedure: CATARACT EXTRACTION PHACO AND INTRAOCULAR LENS PLACEMENT ([place]) LEFT DIABETIC;  Surgeon: Leandrew Koyanagi, MD;  Location: [facility];  Service: Ophthalmology;  Laterality: Left;  diabetic-oral meds  . CATARACT EXTRACTION W/PHACO Right 05/31/2017   Procedure: CATARACT EXTRACTION PHACO AND INTRAOCULAR LENS PLACEMENT ([place]) RIGHT DIABETIC;  Surgeon: Leandrew Koyanagi, MD;  Location: [facility];  Service: Ophthalmology;  Laterality: Right;  . CHOLECYSTECTOMY N/A 06/16/2016   Procedure: LAPAROSCOPIC CHOLECYSTECTOMY WITH INTRAOPERATIVE CHOLANGIOGRAM;  Surgeon: Leonie Green, MD;  Location: [facility];  Service: General;  Laterality: N/A;  . CORONARY STENT PLACEMENT    . JOINT REPLACEMENT    . LUMBAR FUSION    . TOTAL HIP ARTHROPLASTY      Current Meds  Medication Sig  . aspirin EC 81 MG tablet Take 81 mg by mouth daily.  . Cholecalciferol (VITAMIN D3) 2000 units TABS Take 2,000 Units by mouth.  . diltiazem (CARDIZEM CD) 240 MG 24 hr capsule Take 480 mg by mouth daily.  . Dulaglutide (TRULICITY [place]) Inject into the skin once a week.  . furosemide (LASIX) 40 MG tablet Take 40 mg by mouth daily as needed for edema.   Marland Kitchen glipiZIDE (GLUCOTROL) 10 MG tablet Take 20 mg by mouth 2 (two) times daily before a meal.  . metoprolol succinate (TOPROL-XL) 100 MG 24 hr tablet Take 100 mg by mouth daily. Take with or immediately following a meal.  . potassium chloride (K-DUR,KLOR-CON) 10  MEQ tablet Take 10 mEq by mouth as needed (when taking Lasix).   . rosuvastatin (CRESTOR) 40 MG tablet Take 1 tablet (40 mg total) by mouth daily.    Allergies:   Ferrous sulfate; Keflex [cephalexin]; Percocet [oxycodone-acetaminophen]; Vicodin [hydrocodone-acetaminophen]; Ace inhibitors; and Penicillins   Social History:  The patient  reports that  has never smoked. she has never used smokeless tobacco. She reports that she does not drink alcohol or use drugs.   Family History:  The patient's family history includes Diabetes in her mother; Heart disease in her father; Hypertension in her mother.  ROS:   Review of Systems  Constitutional: Positive for malaise/fatigue. Negative for chills, diaphoresis, fever and weight loss.  HENT: Negative for congestion.   Eyes: Negative for discharge and redness.  Respiratory: Negative for cough, hemoptysis, sputum production, shortness of breath and wheezing.   Cardiovascular: Negative for chest pain, palpitations, orthopnea, claudication, leg swelling and PND.  Gastrointestinal: Negative for abdominal pain, blood in stool, heartburn, melena, nausea and vomiting.  Genitourinary: Negative for hematuria.  Musculoskeletal: Negative  for falls and myalgias.  Skin: Negative for rash.  Neurological: Positive for weakness. Negative for dizziness, tingling, tremors, sensory change, speech change, focal weakness and loss of consciousness.  Endo/Heme/Allergies: Does not bruise/bleed easily.  Psychiatric/Behavioral: Negative for substance abuse. The patient is not nervous/anxious.   All other systems reviewed and are negative.    PHYSICAL EXAM:  VS:  BP 132/60 (BP Location: Left Arm, Patient Position: Sitting, Cuff Size: Normal)   Pulse 77   Ht 5\' 4"  (1.626 m)   Wt 250 lb (113.4 kg)   BMI 42.91 kg/m  BMI: Body mass index is 42.91 kg/m.  Physical Exam  Constitutional: She is oriented to person, place, and time. She appears well-developed and  well-nourished.  HENT:  Head: Normocephalic and atraumatic.  Eyes: Right eye exhibits no discharge. Left eye exhibits no discharge.  Neck: Normal range of motion. No JVD present.  Cardiovascular: Normal rate, regular rhythm, S1 normal, S2 normal and normal heart sounds. Exam reveals no distant heart sounds, no friction rub, no midsystolic click and no opening snap.  No murmur heard. Pulses:      Posterior tibial pulses are 2+ on the right side, and 2+ on the left side.  Pulmonary/Chest: Effort normal and breath sounds normal. No respiratory distress. She has no decreased breath sounds. She has no wheezes. She has no rales. She exhibits no tenderness.  Abdominal: Soft. She exhibits no distension. There is no tenderness.  Musculoskeletal: She exhibits no edema.  Neurological: She is alert and oriented to person, place, and time.  Skin: Skin is warm and dry. No cyanosis. Nails show no clubbing.  Psychiatric: She has a normal mood and affect. Her speech is normal and behavior is normal. Judgment and thought content normal.     EKG:  Was ordered and interpreted by me today. Shows NSR, 77 bpm, bifascicular block (unchanged)  Recent Labs: 06/28/2017: BUN 25; Creatinine, Ser 1.69; Potassium 5.1; Sodium 140  No results found for requested labs within last 8760 hours.   CrCl cannot be calculated (Patient's most recent lab result is older than the maximum 21 days allowed.).   Wt Readings from Last 3 Encounters:  08/09/17 250 lb (113.4 kg)  07/11/17 256 lb 8 oz (116.3 kg)  06/28/17 255 lb 12 oz (116 kg)     Other studies reviewed: Additional studies/records reviewed today include: summarized above  ASSESSMENT AND PLAN:  1. Chest pain/SOB: Resolved. Recent Myoview as above. No further cardiac workup at this time.   2. Fatigue: Likely in the setting of deconditioning. Recommend she slowly advance activities as tolerated.   3. Orthostatic hypotension: Resolved. Blood pressure is reasonably  controlled today and at home. I will not make any changes to her current regimen given her recent history of orthostatic hypotension.   4. CAD in native coronary artery without angina: No further chest pain. Recent [employer] as above. Continue ASA, Toprol, Crestor. No further ischemia workup at this time.  5. Diastolic dysfunction: She does not appear volume overloaded at this time. Continue prn Lasix (has not needed any recently).   6. HTN: Reasonably controlled as above. No changes as her home readings have been well controlled and she has history of orthostatic hypotension. She will call if her readings start to trend > 536 mmHg systolic.   7. Valvular heart disease: Mild AI/MR by echo 06/2017. Follow clinically with periodic echo.   8. CKD stage III: Followed by nephrology. Recent labs drawn by [facility] showed stable renal  function and potassium.   Disposition: F/u with me in 6 months.   Current medicines are reviewed at length with the patient today.  The patient did not have any concerns regarding medicines.  Signed, Christell Faith, PA-C 08/09/2017 8:02 AM     [facility] [address] [place] [address] [place], [place] [address] [phone]

## 2017-08-09 ENCOUNTER — Ambulatory Visit (INDEPENDENT_AMBULATORY_CARE_PROVIDER_SITE_OTHER): Payer: Medicare Other | Admitting: Physician Assistant

## 2017-08-09 ENCOUNTER — Encounter: Payer: Self-pay | Admitting: Physician Assistant

## 2017-08-09 VITALS — BP 132/60 | HR 77 | Ht 64.0 in | Wt 250.0 lb

## 2017-08-09 DIAGNOSIS — N183 Chronic kidney disease, stage 3 unspecified: Secondary | ICD-10-CM

## 2017-08-09 DIAGNOSIS — I519 Heart disease, unspecified: Secondary | ICD-10-CM

## 2017-08-09 DIAGNOSIS — R0602 Shortness of breath: Secondary | ICD-10-CM | POA: Diagnosis not present

## 2017-08-09 DIAGNOSIS — I2510 Atherosclerotic heart disease of native coronary artery without angina pectoris: Secondary | ICD-10-CM | POA: Diagnosis not present

## 2017-08-09 DIAGNOSIS — I38 Endocarditis, valve unspecified: Secondary | ICD-10-CM | POA: Diagnosis not present

## 2017-08-09 DIAGNOSIS — I951 Orthostatic hypotension: Secondary | ICD-10-CM | POA: Diagnosis not present

## 2017-08-09 DIAGNOSIS — I10 Essential (primary) hypertension: Secondary | ICD-10-CM | POA: Diagnosis not present

## 2017-08-09 DIAGNOSIS — I5189 Other ill-defined heart diseases: Secondary | ICD-10-CM

## 2017-08-09 MED ORDER — HYDROCHLOROTHIAZIDE 25 MG PO TABS: 25.0000 mg | ORAL_TABLET | Freq: Every day | ORAL | 3 refills | Status: DC

## 2017-08-09 NOTE — Patient Instructions (Addendum)
Medication Instructions:  Your physician recommends that you continue on your current medications as directed. Please refer to the Current Medication list given to you today. Hydrochlorothiazide 25mg  daily has been added back to your medication list.    Labwork: none  Testing/Procedures: none  Follow-Up: Your physician wants you to follow-up in: 6 months with Christell Faith, PA-C or Dr. Rockey Situ.  You will receive a reminder letter in the mail two months in advance. If you don't receive a letter, please call our office to schedule the follow-up appointment.   Any Other Special Instructions Will Be Listed Below (If Applicable).     If you need a refill on your cardiac medications before your next appointment, please call your pharmacy.

## 2017-11-17 ENCOUNTER — Other Ambulatory Visit: Payer: Self-pay | Admitting: Cardiovascular Disease

## 2017-11-21 ENCOUNTER — Ambulatory Visit
Admission: RE | Admit: 2017-11-21 | Discharge: 2017-11-21 | Disposition: A | Discharge status: Home / Self Care | Payer: Medicare Other | Source: Ambulatory Visit | Attending: Urology | Admitting: Urology

## 2017-11-21 DIAGNOSIS — N281 Cyst of kidney, acquired: Secondary | ICD-10-CM | POA: Insufficient documentation

## 2017-11-23 ENCOUNTER — Ambulatory Visit: Payer: Medicare Other

## 2017-12-19 ENCOUNTER — Ambulatory Visit: Payer: Medicare Other | Admitting: Urology

## 2018-01-29 ENCOUNTER — Encounter: Payer: Self-pay | Admitting: Urology

## 2018-01-29 ENCOUNTER — Other Ambulatory Visit: Payer: Self-pay

## 2018-01-29 ENCOUNTER — Ambulatory Visit: Payer: Medicare Other | Admitting: Urology

## 2018-01-29 VITALS — BP 147/68 | HR 75 | Ht 64.0 in | Wt 255.0 lb

## 2018-01-29 DIAGNOSIS — N281 Cyst of kidney, acquired: Secondary | ICD-10-CM

## 2018-01-29 NOTE — Progress Notes (Signed)
01/29/2018 10:25 AM   [place] Melissa Christian 1941-03-03 [id number]  Referring provider: Glendon Axe, MD [facility] [place], [place] [address]  Chief Complaint  Patient presents with  . Renal Mass   Urologic problems: 1.  13 mm hypodense lesion incidentally noted on a CT scan of 2017.  MRI consistent with a nonenhancing proteinaceous cyst.  HPI: 77 year old female presents for follow-up of the above problem.  She saw Dr. Junious Silk in November 2018 and renal ultrasound at that time showed a 15 mm complex left lower pole renal lesion.  She had a noncontrast MRI performed in May 2019 which showed a stable 12 mm left lower pole mass consistent with a proteinaceous cyst.  She denies flank or abdominal pain.  She denies lower urinary tract symptoms or gross hematuria.   PMH: Past Medical History:  Diagnosis Date  . Anemia   . CAD (coronary artery disease)    a. NSTEMI 09/2015: cardiac cath: LM no obs dz, mLAD 95% s/p PCI/DES 0%, LCx no obs, RCA no obs, LVEF 55-65%  . Chronic kidney disease (CKD), stage III (moderate) (HCC)   . Diabetes mellitus with complication ([place])   . HLD (hyperlipidemia)   . Hypertension   . Low blood potassium   . NSTEMI (non-ST elevated myocardial infarction) ([place])   . PONV (postoperative nausea and vomiting)   . Valvular heart disease    a. 09/2015: EF 55-60%, challenging images unable to exclude mild mid-distal anterior to apical HK, nl LV dias fxn, mild AI/MR, normal size of left atrium, RV systolic function normal, PASP normal      Surgical History: Past Surgical History:  Procedure Laterality Date  . BACK SURGERY    . CARDIAC CATHETERIZATION N/A 09/23/2015   Procedure: Right and Left Heart Cath;  Surgeon: Minna Merritts, MD;  Location: [facility];  Service: Cardiovascular;  Laterality: N/A;  . CARDIAC CATHETERIZATION N/A 09/23/2015   Procedure: Coronary Stent Intervention;  Surgeon: Yolonda Kida, MD;  Location:  [facility];  Service: Cardiovascular;  Laterality: N/A;  . CATARACT EXTRACTION W/PHACO Left 04/26/2017   Procedure: CATARACT EXTRACTION PHACO AND INTRAOCULAR LENS PLACEMENT ([place]) LEFT DIABETIC;  Surgeon: Leandrew Koyanagi, MD;  Location: [facility];  Service: Ophthalmology;  Laterality: Left;  diabetic-oral meds  . CATARACT EXTRACTION W/PHACO Right 05/31/2017   Procedure: CATARACT EXTRACTION PHACO AND INTRAOCULAR LENS PLACEMENT ([place]) RIGHT DIABETIC;  Surgeon: Leandrew Koyanagi, MD;  Location: [facility];  Service: Ophthalmology;  Laterality: Right;  . CHOLECYSTECTOMY N/A 06/16/2016   Procedure: LAPAROSCOPIC CHOLECYSTECTOMY WITH INTRAOPERATIVE CHOLANGIOGRAM;  Surgeon: Leonie Green, MD;  Location: [facility];  Service: General;  Laterality: N/A;  . CORONARY STENT PLACEMENT    . JOINT REPLACEMENT    . LUMBAR FUSION    . TOTAL HIP ARTHROPLASTY      Home Medications:  Allergies as of 01/29/2018      Reactions   Ferrous Sulfate Itching   Keflex [cephalexin] Itching   Percocet [oxycodone-acetaminophen] Itching   Vicodin [hydrocodone-acetaminophen] Itching   Ace Inhibitors Rash   elevated potassium at higher doses   Penicillins Rash   Has patient had a PCN reaction causing immediate rash, facial/tongue/throat swelling, SOB or lightheadedness with hypotension: No Has patient had a PCN reaction causing severe rash involving mucus membranes or skin necrosis: No Has patient had a PCN reaction that required hospitalization No Has patient had a PCN reaction occurring within the last 10 years: No If  all of the above answers are "NO", then may proceed with Cephalosporin use.      Medication List        Accurate as of 01/29/18 10:25 AM. Always use your most recent med list.          aspirin EC 81 MG tablet Take 81 mg by mouth daily.   diltiazem 240 MG 24 hr capsule Commonly known as:  CARDIZEM CD Take 480 mg by mouth daily.   furosemide 40 MG  tablet Commonly known as:  LASIX Take 40 mg by mouth daily as needed for edema.   glipiZIDE 10 MG tablet Commonly known as:  GLUCOTROL Take 20 mg by mouth 2 (two) times daily before a meal.   hydrochlorothiazide 25 MG tablet Commonly known as:  HYDRODIURIL Take 1 tablet (25 mg total) by mouth daily.   metoprolol succinate 100 MG 24 hr tablet Commonly known as:  TOPROL-XL Take 100 mg by mouth daily. Take with or immediately following a meal.   potassium chloride 10 MEQ tablet Commonly known as:  K-DUR,KLOR-CON Take 10 mEq by mouth as needed (when taking Lasix).   rosuvastatin 40 MG tablet Commonly known as:  CRESTOR TAKE 1 TABLET BY MOUTH EVERY DAY   TRULICITY [place] Inject into the skin once a week.   Vitamin D3 2000 units Tabs Take 2,000 Units by mouth.       Allergies:  Allergies  Allergen Reactions  . Ferrous Sulfate Itching  . Keflex [Cephalexin] Itching  . Percocet [Oxycodone-Acetaminophen] Itching  . Vicodin [Hydrocodone-Acetaminophen] Itching  . Ace Inhibitors Rash    elevated potassium at higher doses  . Penicillins Rash    Has patient had a PCN reaction causing immediate rash, facial/tongue/throat swelling, SOB or lightheadedness with hypotension: No Has patient had a PCN reaction causing severe rash involving mucus membranes or skin necrosis: No Has patient had a PCN reaction that required hospitalization No Has patient had a PCN reaction occurring within the last 10 years: No If all of the above answers are "NO", then may proceed with Cephalosporin use.     Family History: Family History  Problem Relation Age of Onset  . Heart disease Father   . Diabetes Mother   . Hypertension Mother     Social History:  reports that she has never smoked. She has never used smokeless tobacco. She reports that she does not drink alcohol or use drugs.  ROS: UROLOGY Frequent Urination?: No Hard to postpone urination?: No Burning/pain with urination?: No Get up at  night to urinate?: No Leakage of urine?: No Urine stream starts and stops?: No Trouble starting stream?: No Do you have to strain to urinate?: No Blood in urine?: No Urinary tract infection?: No Sexually transmitted disease?: No Injury to kidneys or bladder?: No Painful intercourse?: No Weak stream?: No Currently pregnant?: No Vaginal bleeding?: No  Gastrointestinal Nausea?: No Vomiting?: No Indigestion/heartburn?: No Diarrhea?: No Constipation?: No  Constitutional Fever: No Night sweats?: No Weight loss?: No Fatigue?: No  Skin Skin rash/lesions?: No Itching?: No  Eyes Blurred vision?: No Double vision?: No  Ears/Nose/Throat Sore throat?: No Sinus problems?: No  Hematologic/Lymphatic Swollen glands?: No Easy bruising?: No  Cardiovascular Leg swelling?: Yes Chest pain?: No  Respiratory Cough?: No Shortness of breath?: No  Endocrine Excessive thirst?: No  Musculoskeletal Back pain?: No Joint pain?: No  Neurological Headaches?: No Dizziness?: No  Psychologic Depression?: No Anxiety?: No  Physical Exam: BP (!) 147/68   Pulse 75   Ht  5\' 4"  (1.626 m)   Wt 255 lb (115.7 kg)   BMI 43.77 kg/m   Constitutional:  Alert and oriented, No acute distress. HEENT: [place] AT, moist mucus membranes.  Trachea midline, no masses. Cardiovascular: No clubbing, cyanosis, or edema. Respiratory: Normal respiratory effort, no increased work of breathing. GI: Abdomen is soft, nontender, nondistended, no abdominal masses GU: No CVA tenderness Lymph: No cervical or inguinal lymphadenopathy. Skin: No rashes, bruises or suspicious lesions. Neurologic: Grossly intact, no focal deficits, moving all 4 extremities. Psychiatric: Normal mood and affect.   Assessment & Plan:   77 year old female with a stable 13 mm left lower pole renal lesion.  Radiology has classified this as Bosniak 13F however is most likely Bosniak 2.  Recommend a follow-up renal ultrasound and visit  9 months from her last imaging study.   Abbie Sons, [place] [place] [address] [place], [place] [address] [phone]

## 2018-04-03 IMAGING — CR DG CHOLANGIOGRAM OPERATIVE
2 series · 4 of 4 positions shown · non-contrast
Comparison: None.

CLINICAL DATA: Gallstone

EXAM:
INTRAOPERATIVE CHOLANGIOGRAM
TECHNIQUE: Cholangiographic images from the C-arm fluoroscopic device were
submitted for interpretation post-operatively. Please see the
procedural report for the amount of contrast and the fluoroscopy
time utilized.

[Series 3: cont. · 2 of 2 frames shown (1 of 2)]
[frame 1/2]
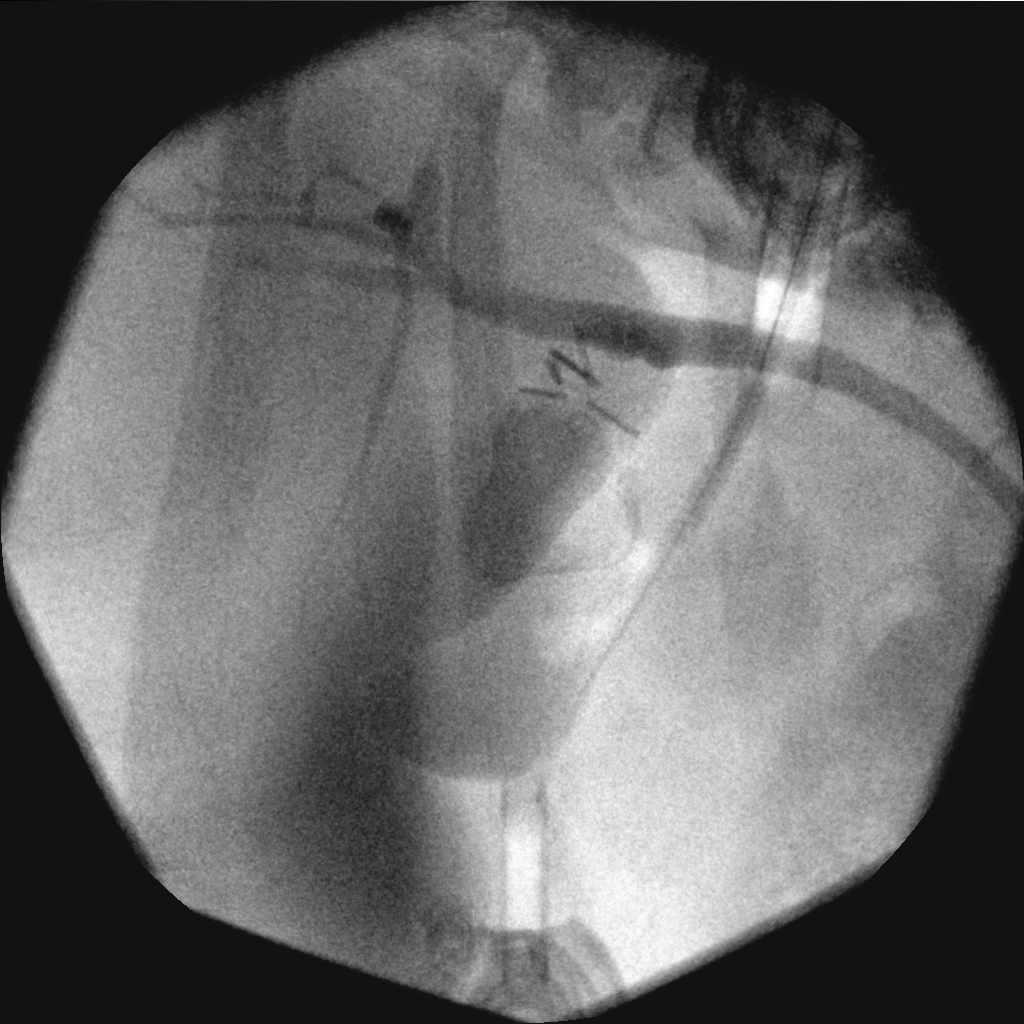
[frame 2/2]
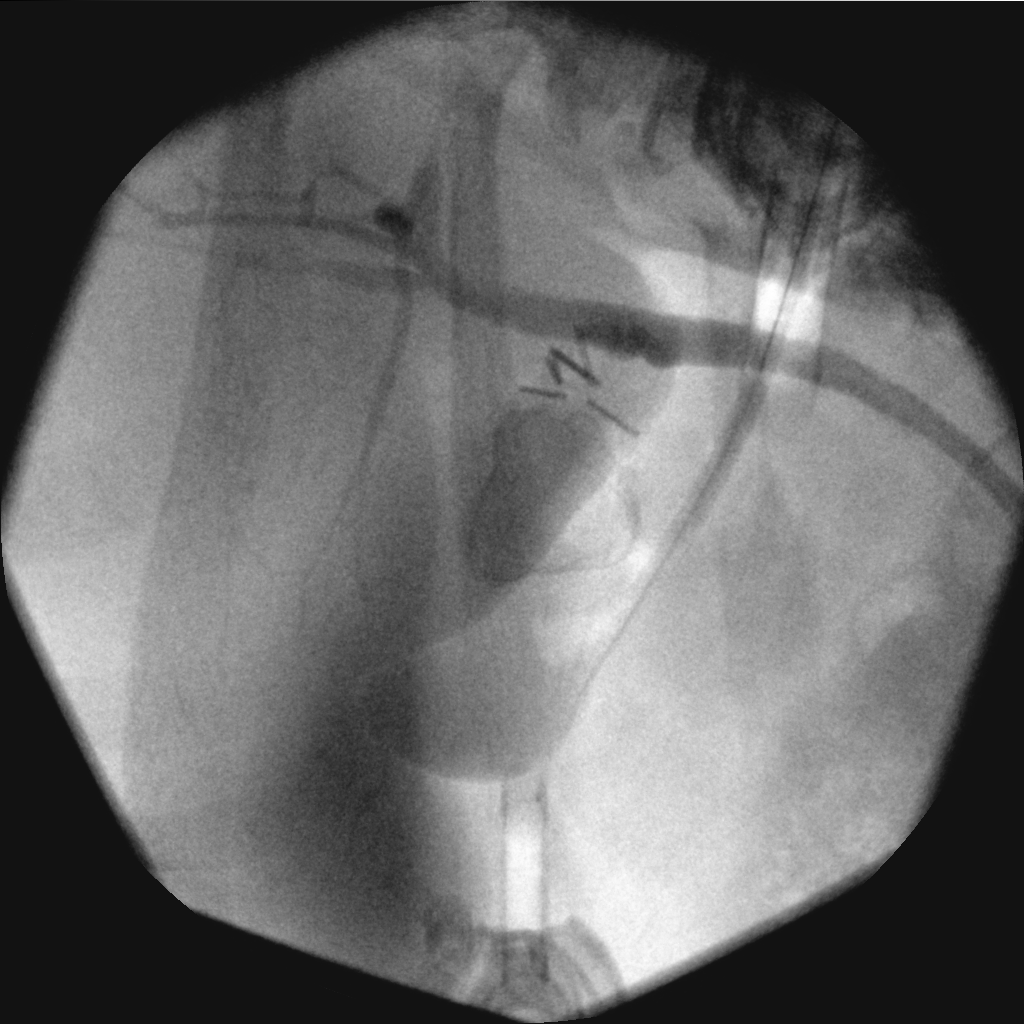

[Series 4: cont. · 2 of 2 frames shown (2 of 2)]
[frame 1/2]
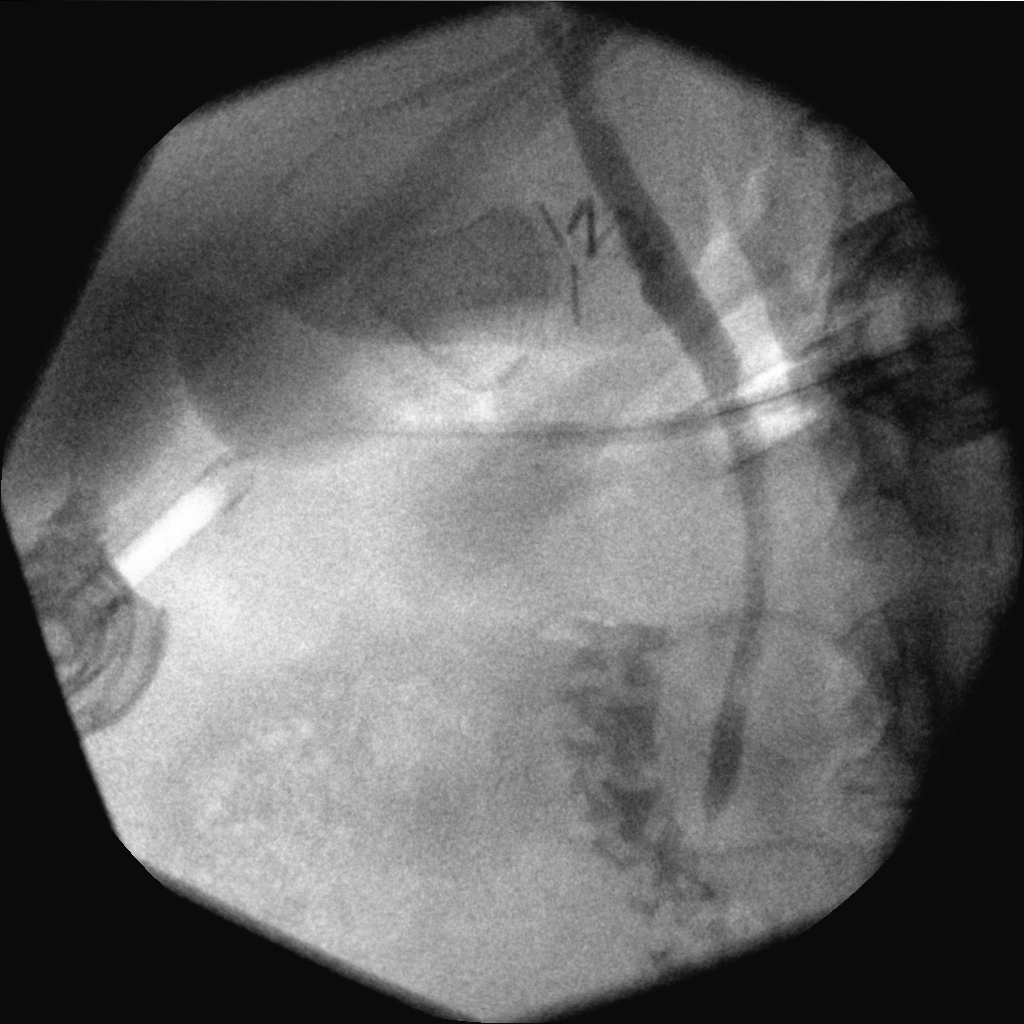
[frame 2/2]
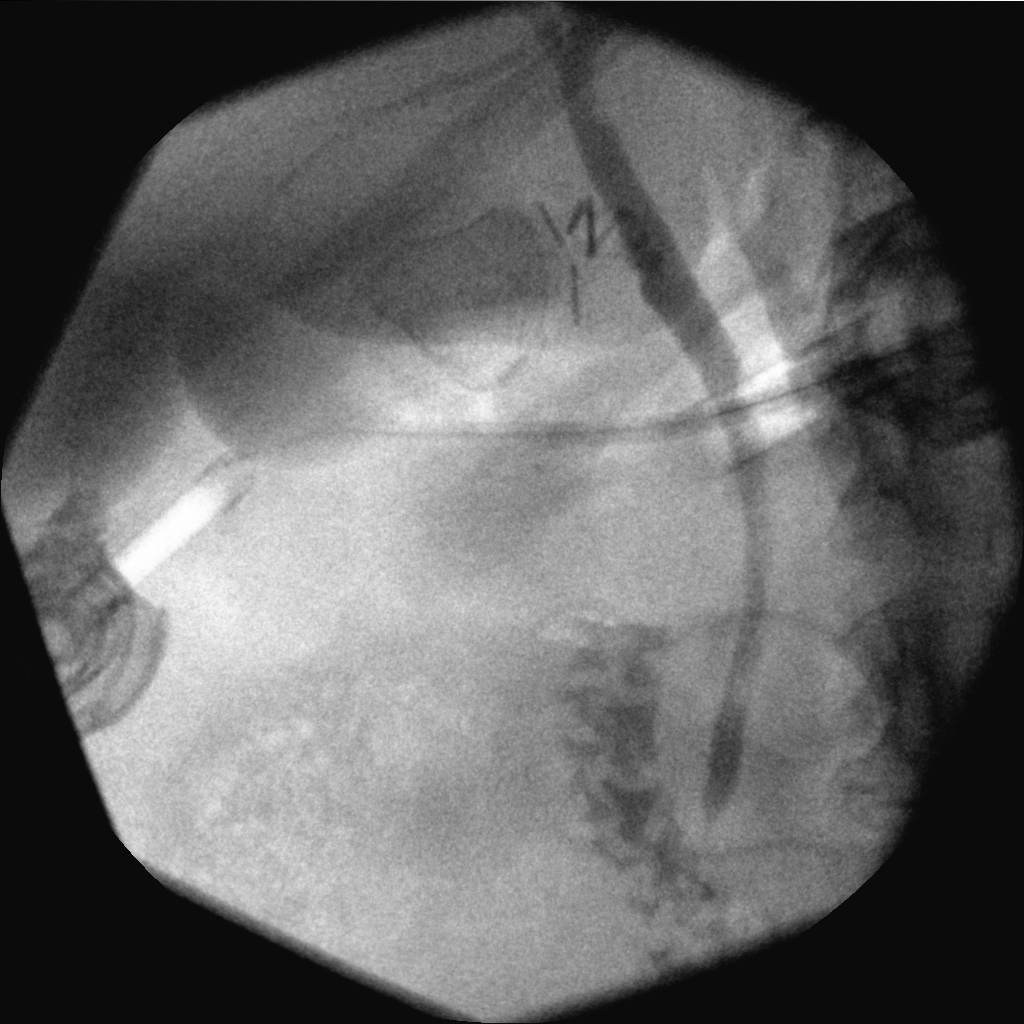

[4 of 4 positions shown; findings below may reference images not displayed]

FINDINGS: Contrast fills the biliary tree and duodenum without filling defects
in the common bile duct.
IMPRESSION: Patent biliary tree.

## 2018-04-16 ENCOUNTER — Other Ambulatory Visit: Payer: Self-pay | Admitting: Internal Medicine

## 2018-04-16 DIAGNOSIS — Z1231 Encounter for screening mammogram for malignant neoplasm of breast: Secondary | ICD-10-CM

## 2018-06-22 ENCOUNTER — Ambulatory Visit
Admission: RE | Admit: 2018-06-22 | Discharge: 2018-06-22 | Disposition: A | Discharge status: Home / Self Care | Payer: Medicare Other | Source: Ambulatory Visit | Attending: Internal Medicine | Admitting: Internal Medicine

## 2018-06-22 DIAGNOSIS — Z1231 Encounter for screening mammogram for malignant neoplasm of breast: Secondary | ICD-10-CM | POA: Diagnosis not present

## 2018-06-28 ENCOUNTER — Other Ambulatory Visit: Payer: Self-pay | Admitting: Internal Medicine

## 2018-06-28 DIAGNOSIS — N6310 Unspecified lump in the right breast, unspecified quadrant: Secondary | ICD-10-CM

## 2018-06-28 DIAGNOSIS — R928 Other abnormal and inconclusive findings on diagnostic imaging of breast: Secondary | ICD-10-CM

## 2018-07-16 ENCOUNTER — Other Ambulatory Visit: Payer: Self-pay | Admitting: Cardiovascular Disease

## 2018-07-16 ENCOUNTER — Ambulatory Visit
Admission: RE | Admit: 2018-07-16 | Discharge: 2018-07-16 | Disposition: A | Discharge status: Home / Self Care | Payer: Medicare Other | Source: Ambulatory Visit | Attending: Internal Medicine | Admitting: Internal Medicine

## 2018-07-16 DIAGNOSIS — R928 Other abnormal and inconclusive findings on diagnostic imaging of breast: Secondary | ICD-10-CM | POA: Diagnosis not present

## 2018-07-16 DIAGNOSIS — N6310 Unspecified lump in the right breast, unspecified quadrant: Secondary | ICD-10-CM | POA: Diagnosis present

## 2018-08-08 ENCOUNTER — Other Ambulatory Visit: Payer: Self-pay | Admitting: Cardiovascular Disease

## 2018-08-17 NOTE — Progress Notes (Signed)
Cardiology Office Note Date:  08/20/2018  Patient ID:  Melissa, Christian [birth year], MRN [id number] PCP:  Glendon Axe, MD  Cardiologist:  Dr. Rockey Situ, MD    Chief Complaint: Follow up  History of Present Illness: Melissa Christian is a 78 y.o. female with history of CAD with NSTEMI in 09/2015 with PCI/DES to the mid LAD as detailed below, CKD stage III, DM2, HTN, HLD, aortic insufficiency, and mitral regurgitation who presents for follow up of her CAD.  She was admitted to the hospital in 09/2015 with a NSTEMI with troponin peaking at 0.86. LHC on 09/23/2015 showed left main without significant disease, mid LAD 95% s/p PCI/DES, LCx and RCA without significant disease. Echo on 09/23/2015 showed EF 55-60%, challenging imaging quality, unable to exclude mild mid to distal anterior wall and apical hypokinesis, normal LV diastolic function parameters, mild AI/MR, normal size of left atrium, RV systolic function normal, PASP normal. Admitted 06/2016 with cholelithiasis, underwent successful laproscopic cholecystectomy after holding Brilinta x 5 days (was continued on aspirin), followed by resumption of DAPT with ASA and Brilinta. She called the office on 06/14/17 noting increased fatigue with possible presyncope x 2 days and was seen in the office on 06/19/17 with positional dizziness, increased fatigue and atypical chest pain.  She was noted to be hypotensive at that time with a blood pressure of 95/61. Her losartan and HCTZ were stopped and bmet showed worsening SCr to 2.04 (baseline ~ 1.5-1.7) with a BUN of 39. RN visit on 06/28/17 showed a BP of 124/64 with a heart rate of 68 bpm. She reported significant improvement in her symptoms since stopping losartan and HCTZ. Repeat bmet on 06/28/17 showed improved SCr back to her ~ baseline at 1.69 and BUN improved to 25. Echo performed on 07/03/17 showed an EF of 60-65%, no RWMA, Gr1DD, mild aortic insufficiency, mild mitral regurgitation, normal RV systolic  function, PASP normal. She was seen in 07/2017 and was doing reasonably well with BP in the 025-852 systolic range. She did note and occasional "sensation" in her chest with ambulation. In this setting, she underwent Lexiscan Myoview on 07/18/2017 that was negative for significant ischemia or scar with an EF > 65%. This was a low risk study. She was last seen in our office in 08/2017 and was doing well without further chest pain.   Labs: 05/2018 - HGB 11.0, K+ 4.3, SCr 1.7, AST/ALT normal, A1c 6.0, LDL 59, TSH 5.930  She comes in doing well today.  No chest pain, shortness of breath, palpitations, dizziness, presyncope, or syncope.  She notes stable bilateral pedal edema in which she has been taking her as needed Lasix on an almost daily basis.  Her weight is up 16 pounds from her last office visit in 08/2017 though she states all she does is "sit at home, watch TV, and snack."  She has slept in a recliner for years secondary to comfort and this is unchanged.  No abdominal distention, PND, or early satiety.  No falls since he was last seen.  No BRBPR or melena.  He needs a refill of her Crestor.  Otherwise, she has no concerns.  Past Medical History:  Diagnosis Date  . Anemia   . CAD (coronary artery disease)    a. NSTEMI 09/2015: cardiac cath: LM no obs dz, mLAD 95% s/p PCI/DES 0%, LCx no obs, RCA no obs, LVEF 55-65%  . Chronic kidney disease (CKD), stage III (moderate) (HCC)   . Diabetes mellitus with complication (  [place])   . HLD (hyperlipidemia)   . Hypertension   . Low blood potassium   . NSTEMI (non-ST elevated myocardial infarction) ([place])   . PONV (postoperative nausea and vomiting)   . Valvular heart disease    a. 09/2015: EF 55-60%, challenging images unable to exclude mild mid-distal anterior to apical HK, nl LV dias fxn, mild AI/MR, normal size of left atrium, RV systolic function normal, PASP normal      Past Surgical History:  Procedure Laterality Date  . BACK SURGERY    . CARDIAC  CATHETERIZATION N/A 09/23/2015   Procedure: Right and Left Heart Cath;  Surgeon: Minna Merritts, MD;  Location: [facility];  Service: Cardiovascular;  Laterality: N/A;  . CARDIAC CATHETERIZATION N/A 09/23/2015   Procedure: Coronary Stent Intervention;  Surgeon: Yolonda Kida, MD;  Location: [facility];  Service: Cardiovascular;  Laterality: N/A;  . CATARACT EXTRACTION W/PHACO Left 04/26/2017   Procedure: CATARACT EXTRACTION PHACO AND INTRAOCULAR LENS PLACEMENT ([place]) LEFT DIABETIC;  Surgeon: Leandrew Koyanagi, MD;  Location: [facility];  Service: Ophthalmology;  Laterality: Left;  diabetic-oral meds  . CATARACT EXTRACTION W/PHACO Right 05/31/2017   Procedure: CATARACT EXTRACTION PHACO AND INTRAOCULAR LENS PLACEMENT ([place]) RIGHT DIABETIC;  Surgeon: Leandrew Koyanagi, MD;  Location: [facility];  Service: Ophthalmology;  Laterality: Right;  . CHOLECYSTECTOMY N/A 06/16/2016   Procedure: LAPAROSCOPIC CHOLECYSTECTOMY WITH INTRAOPERATIVE CHOLANGIOGRAM;  Surgeon: Leonie Green, MD;  Location: [facility];  Service: General;  Laterality: N/A;  . CORONARY STENT PLACEMENT    . JOINT REPLACEMENT    . LUMBAR FUSION    . TOTAL HIP ARTHROPLASTY      Current Meds  Medication Sig  . aspirin EC 81 MG tablet Take 81 mg by mouth daily.  . Cholecalciferol (VITAMIN D3) 2000 units TABS Take 2,000 Units by mouth.  . diltiazem (CARDIZEM CD) 240 MG 24 hr capsule Take 480 mg by mouth daily.  . ferrous sulfate 325 (65 FE) MG tablet Take 325 mg by mouth 2 (two) times daily.  . furosemide (LASIX) 40 MG tablet Take 40 mg by mouth daily as needed for edema.   Marland Kitchen glipiZIDE (GLUCOTROL) 10 MG tablet Take 20 mg by mouth 2 (two) times daily before a meal.  . hydrochlorothiazide (HYDRODIURIL) 25 MG tablet Take 1 tablet (25 mg total) by mouth daily.  . metoprolol succinate (TOPROL-XL) 100 MG 24 hr tablet Take 100 mg by mouth daily. Take with or immediately following a meal.  .  pioglitazone (ACTOS) 30 MG tablet Take 30 mg by mouth daily.   . potassium chloride (K-DUR,KLOR-CON) 10 MEQ tablet Take 10 mEq by mouth as needed (when taking Lasix).   . rosuvastatin (CRESTOR) 40 MG tablet TAKE 1 TABLET BY MOUTH EVERY DAY  . TRULICITY 1.5 ER/7.4YC SOPN INJECT 1.5 MG SUBCUTANEOUSLY EVERY 7 (SEVEN) DAYS    Allergies:   Ferrous sulfate; Keflex [cephalexin]; Percocet [oxycodone-acetaminophen]; Vicodin [hydrocodone-acetaminophen]; Ace inhibitors; and Penicillins   Social History:  The patient  reports that she has never smoked. She has never used smokeless tobacco. She reports that she does not drink alcohol or use drugs.   Family History:  The patient's family history includes Diabetes in her mother; Heart disease in her father; Hypertension in her mother.  ROS:   Review of Systems  Constitutional: Negative for chills, diaphoresis, fever, malaise/fatigue and weight loss.  HENT: Negative for congestion.   Eyes: Negative for discharge and redness.  Respiratory: Negative for cough, hemoptysis,  sputum production, shortness of breath and wheezing.   Cardiovascular: Positive for leg swelling. Negative for chest pain, palpitations, orthopnea, claudication and PND.       Stable, bilateral lower extremity swelling.   Gastrointestinal: Negative for abdominal pain, blood in stool, heartburn, melena, nausea and vomiting.  Genitourinary: Negative for hematuria.  Musculoskeletal: Negative for falls and myalgias.  Skin: Negative for rash.  Neurological: Negative for dizziness, tingling, tremors, sensory change, speech change, focal weakness, loss of consciousness and weakness.  Endo/Heme/Allergies: Does not bruise/bleed easily.  Psychiatric/Behavioral: Negative for substance abuse. The patient is not nervous/anxious.   All other systems reviewed and are negative.    PHYSICAL EXAM:  VS:  BP (!) 127/59 (BP Location: Left Arm, Patient Position: Sitting, Cuff Size: Large)   Pulse 66   Ht  5\' 4"  (1.626 m)   Wt 268 lb (121.6 kg)   BMI 46.00 kg/m  BMI: Body mass index is 46 kg/m.  Physical Exam  Constitutional: She is oriented to person, place, and time. She appears well-developed and well-nourished.  HENT:  Head: Normocephalic and atraumatic.  Eyes: Right eye exhibits no discharge. Left eye exhibits no discharge.  Neck: Normal range of motion. No JVD present.  Cardiovascular: Normal rate, regular rhythm, S1 normal and S2 normal. Exam reveals no distant heart sounds, no friction rub, no midsystolic click and no opening snap.  Murmur heard. High-pitched blowing holosystolic murmur is present with a grade of 2/6 at the apex. High-pitched blowing decrescendo early diastolic murmur is present with a grade of 1/6 at the upper right sternal border radiating to the apex. Pulses:      Posterior tibial pulses are 2+ on the right side and 2+ on the left side.  Pulmonary/Chest: Effort normal and breath sounds normal. No respiratory distress. She has no decreased breath sounds. She has no wheezes. She has no rales. She exhibits no tenderness.  Abdominal: Soft. She exhibits no distension. There is no abdominal tenderness.  Musculoskeletal:        General: No edema.  Neurological: She is alert and oriented to person, place, and time.  Skin: Skin is warm and dry. No cyanosis. Nails show no clubbing.  Psychiatric: She has a normal mood and affect. Her speech is normal and behavior is normal. Judgment and thought content normal.     EKG:  Was ordered and interpreted by me today. Shows NSR, 66 bpm, bifascicular block with RBBB and left anterior fascicular block (unchanged from prior)  Recent Labs: No results found for requested labs within last 8760 hours.  No results found for requested labs within last 8760 hours.   CrCl cannot be calculated (Patient's most recent lab result is older than the maximum 21 days allowed.).   Wt Readings from Last 3 Encounters:  08/20/18 268 lb (121.6  kg)  01/29/18 255 lb (115.7 kg)  08/09/17 250 lb (113.4 kg)     Other studies reviewed: Additional studies/records reviewed today include: summarized above  ASSESSMENT AND PLAN:  1. CAD involving the native coronary arteries without angina: She is doing well without any symptoms concerning for angina.  Continue current medical therapy with aspirin, metoprolol, and rosuvastatin.  Aggressive secondary prevention.  2. Diastolic dysfunction: Her weight is up she has no lower extremity swelling on exam and her lungs are clear to auscultation bilaterally.  Continue as needed Lasix.  Check echo as below.  3. Valvular heart disease: Echo from 06/2017 showed unchanged mild aortic and mitral valve insufficiency.  Asymptomatic.  Schedule echo.  4. Lower extremity swelling: Likely exacerbated by dependent edema in the setting of the patient's obesity, chronic venous insufficiency, and calcium channel blocker usage.  Patient refuses to taper or discontinue her Cardizem at this time.  Recommend leg elevation and continued PRN Lasix.  5. CKD stage III: Most recent renal function from 05/2018 showed creatinine of 1.7 which was at her approximate baseline.  She reports a recent BMP from her nephrologist last month with a patient reported stable kidney function.  In this setting, we will defer venipuncture today and attempt to obtain labs from her nephrologist.  6. Hypertension: Blood pressure is well controlled today.  Patient refuses changes today.  In this setting, continue current medications.  7. Hyperlipidemia: LDL from 05/2018 of 59 with normal liver function at that time.  Remains on Crestor 40 mg daily.  Refill Crestor.   Disposition: F/u with Dr. Rockey Situ or an APP in 6 months, sooner if needed  Current medicines are reviewed at length with the patient today.  The patient did not have any concerns regarding medicines.  Signed, Christell Faith, PA-C 08/20/2018 9:16 AM     [address] [place], [place] [address] [phone]

## 2018-08-20 ENCOUNTER — Encounter: Payer: Self-pay | Admitting: Physician Assistant

## 2018-08-20 ENCOUNTER — Ambulatory Visit: Payer: Medicare Other | Admitting: Physician Assistant

## 2018-08-20 VITALS — BP 127/59 | HR 66 | Ht 64.0 in | Wt 268.0 lb

## 2018-08-20 DIAGNOSIS — E785 Hyperlipidemia, unspecified: Secondary | ICD-10-CM

## 2018-08-20 DIAGNOSIS — I5189 Other ill-defined heart diseases: Secondary | ICD-10-CM | POA: Diagnosis not present

## 2018-08-20 DIAGNOSIS — I2510 Atherosclerotic heart disease of native coronary artery without angina pectoris: Secondary | ICD-10-CM

## 2018-08-20 DIAGNOSIS — N183 Chronic kidney disease, stage 3 unspecified: Secondary | ICD-10-CM

## 2018-08-20 DIAGNOSIS — I340 Nonrheumatic mitral (valve) insufficiency: Secondary | ICD-10-CM

## 2018-08-20 DIAGNOSIS — M7989 Other specified soft tissue disorders: Secondary | ICD-10-CM | POA: Diagnosis not present

## 2018-08-20 DIAGNOSIS — I10 Essential (primary) hypertension: Secondary | ICD-10-CM

## 2018-08-20 MED ORDER — HYDROCHLOROTHIAZIDE 25 MG PO TABS: 25.0000 mg | ORAL_TABLET | Freq: Every day | ORAL | 3 refills | Status: DC

## 2018-08-20 MED ORDER — ROSUVASTATIN CALCIUM 40 MG PO TABS: 40.0000 mg | ORAL_TABLET | Freq: Every day | ORAL | 3 refills | Status: DC

## 2018-08-20 MED ORDER — DILTIAZEM HCL ER COATED BEADS 240 MG PO CP24: 480.0000 mg | ORAL_CAPSULE | Freq: Every day | ORAL | 3 refills | Status: DC

## 2018-08-20 MED ORDER — METOPROLOL SUCCINATE ER 100 MG PO TB24: 100.0000 mg | ORAL_TABLET | Freq: Every day | ORAL | 3 refills | Status: DC

## 2018-08-20 NOTE — Patient Instructions (Signed)
Medication Instructions:  Your physician recommends that you continue on your current medications as directed. Please refer to the Current Medication list given to you today.  If you need a refill on your cardiac medications before your next appointment, please call your pharmacy.   Lab work: None ordered  If you have labs (blood work) drawn today and your tests are completely normal, you will receive your results only by: Marland Kitchen MyChart Message (if you have MyChart) OR . A paper copy in the mail If you have any lab test that is abnormal or we need to change your treatment, we will call you to review the results.  Testing/Procedures: 1- Echo Your physician has requested that you have an echocardiogram. Echocardiography is a painless test that uses sound waves to create images of your heart. It provides your doctor with information about the size and shape of your heart and how well your heart's chambers and valves are working. This procedure takes approximately one hour. There are no restrictions for this procedure.    Follow-Up: At [facility], you and your health needs are our priority.  As part of our continuing mission to provide you with exceptional heart care, we have created designated Provider Care Teams.  These Care Teams include your primary Cardiologist (physician) and Advanced Practice Providers (APPs -  Physician Assistants and Nurse Practitioners) who all work together to provide you with the care you need, when you need it. You will need a follow up appointment in 6 months.  Please call our office 2 months in advance to schedule this appointment.  You may see Dr. Rockey Situ or Christell Faith, PA-C.

## 2018-08-23 ENCOUNTER — Ambulatory Visit
Admission: RE | Admit: 2018-08-23 | Discharge: 2018-08-23 | Disposition: A | Discharge status: Home / Self Care | Payer: Medicare Other | Source: Ambulatory Visit | Attending: Urology | Admitting: Urology

## 2018-08-23 DIAGNOSIS — N281 Cyst of kidney, acquired: Secondary | ICD-10-CM | POA: Diagnosis present

## 2018-08-24 NOTE — Progress Notes (Signed)
08/27/2018  9:20 AM   Melissa Christian [birth year] [id number]  Referring provider: Glendon Axe, MD [facility] [place], [place] [address]  Chief Complaint  Patient presents with  . Follow-up   Urologic History 1. Renal Lesion   - 13-mm hypodense lesion incidentally noted on CT scan in 2017  - Previously followed by Dr. Junious Silk (05/2017), RUS with 15-mm complex left lower pole renal lesion  - MRI (11/2017) with non-enhancing 12-mm left lower pole proteinaceous cyst; Classified Bosniak 45F, likely Bosniak 2  - RUS (08/23/18) with left renal cyst visualized (1.6 cm)  HPI: Melissa Christian is a 78 y.o. White or Caucasian female that presents today for evaluation and management of above. Her last visit was 9 months ago.  - Denies flank/abdominal pain. Denies lower urinary tract symptoms and gross hematuria.   PMH: Past Medical History:  Diagnosis Date  . Anemia   . CAD (coronary artery disease)    a. NSTEMI 09/2015: cardiac cath: LM no obs dz, mLAD 95% s/p PCI/DES 0%, LCx no obs, RCA no obs, LVEF 55-65%  . Chronic kidney disease (CKD), stage III (moderate) (HCC)   . Diabetes mellitus with complication ([place])   . HLD (hyperlipidemia)   . Hypertension   . Low blood potassium   . NSTEMI (non-ST elevated myocardial infarction) ([place])   . PONV (postoperative nausea and vomiting)   . Valvular heart disease    a. 09/2015: EF 55-60%, challenging images unable to exclude mild mid-distal anterior to apical HK, nl LV dias fxn, mild AI/MR, normal size of left atrium, RV systolic function normal, PASP normal      Surgical History: Past Surgical History:  Procedure Laterality Date  . BACK SURGERY    . CARDIAC CATHETERIZATION N/A 09/23/2015   Procedure: Right and Left Heart Cath;  Surgeon: Minna Merritts, MD;  Location: [facility];  Service: Cardiovascular;  Laterality: N/A;  . CARDIAC CATHETERIZATION N/A 09/23/2015   Procedure: Coronary Stent Intervention;   Surgeon: Yolonda Kida, MD;  Location: [facility];  Service: Cardiovascular;  Laterality: N/A;  . CATARACT EXTRACTION W/PHACO Left 04/26/2017   Procedure: CATARACT EXTRACTION PHACO AND INTRAOCULAR LENS PLACEMENT ([place]) LEFT DIABETIC;  Surgeon: Leandrew Koyanagi, MD;  Location: [facility];  Service: Ophthalmology;  Laterality: Left;  diabetic-oral meds  . CATARACT EXTRACTION W/PHACO Right 05/31/2017   Procedure: CATARACT EXTRACTION PHACO AND INTRAOCULAR LENS PLACEMENT ([place]) RIGHT DIABETIC;  Surgeon: Leandrew Koyanagi, MD;  Location: [facility];  Service: Ophthalmology;  Laterality: Right;  . CHOLECYSTECTOMY N/A 06/16/2016   Procedure: LAPAROSCOPIC CHOLECYSTECTOMY WITH INTRAOPERATIVE CHOLANGIOGRAM;  Surgeon: Leonie Green, MD;  Location: [facility];  Service: General;  Laterality: N/A;  . CORONARY STENT PLACEMENT    . JOINT REPLACEMENT    . LUMBAR FUSION    . TOTAL HIP ARTHROPLASTY      Home Medications:  Allergies as of 08/27/2018      Reactions   Ferrous Sulfate Itching   Keflex [cephalexin] Itching   Percocet [oxycodone-acetaminophen] Itching   Vicodin [hydrocodone-acetaminophen] Itching   Ace Inhibitors Rash   elevated potassium at higher doses   Penicillins Rash   Has patient had a PCN reaction causing immediate rash, facial/tongue/throat swelling, SOB or lightheadedness with hypotension: No Has patient had a PCN reaction causing severe rash involving mucus membranes or skin necrosis: No Has patient had a PCN reaction that required hospitalization No Has patient had a PCN reaction occurring within the last  10 years: No If all of the above answers are "NO", then may proceed with Cephalosporin use.      Medication List       Accurate as of August 27, 2018  9:20 AM. Always use your most recent med list.        aspirin EC 81 MG tablet Take 81 mg by mouth daily.   diltiazem 240 MG 24 hr capsule Commonly known as:  CARDIZEM CD Take 2  capsules (480 mg total) by mouth daily.   ferrous sulfate 325 (65 FE) MG tablet Take 325 mg by mouth 2 (two) times daily.   furosemide 40 MG tablet Commonly known as:  LASIX Take 40 mg by mouth daily as needed for edema.   glipiZIDE 10 MG tablet Commonly known as:  GLUCOTROL Take 20 mg by mouth 2 (two) times daily before a meal.   hydrochlorothiazide 25 MG tablet Commonly known as:  HYDRODIURIL Take 1 tablet (25 mg total) by mouth daily.   metoprolol succinate 100 MG 24 hr tablet Commonly known as:  TOPROL-XL Take 1 tablet (100 mg total) by mouth daily. Take with or immediately following a meal.   pioglitazone 30 MG tablet Commonly known as:  ACTOS Take 30 mg by mouth daily.   potassium chloride 10 MEQ tablet Commonly known as:  K-DUR,KLOR-CON Take 10 mEq by mouth as needed (when taking Lasix).   RENA-VITE RX 1 MG Tabs Take 1 tablet by mouth daily.   rosuvastatin 40 MG tablet Commonly known as:  CRESTOR Take 1 tablet (40 mg total) by mouth daily.   TRULICITY 1.5 PZ/0.2HE Sopn Generic drug:  Dulaglutide INJECT 1.5 MG SUBCUTANEOUSLY EVERY 7 (SEVEN) DAYS   Vitamin D3 50 MCG (2000 UT) Tabs Take 2,000 Units by mouth.       Allergies:  Allergies  Allergen Reactions  . Ferrous Sulfate Itching  . Keflex [Cephalexin] Itching  . Percocet [Oxycodone-Acetaminophen] Itching  . Vicodin [Hydrocodone-Acetaminophen] Itching  . Ace Inhibitors Rash    elevated potassium at higher doses  . Penicillins Rash    Has patient had a PCN reaction causing immediate rash, facial/tongue/throat swelling, SOB or lightheadedness with hypotension: No Has patient had a PCN reaction causing severe rash involving mucus membranes or skin necrosis: No Has patient had a PCN reaction that required hospitalization No Has patient had a PCN reaction occurring within the last 10 years: No If all of the above answers are "NO", then may proceed with Cephalosporin use.     Family History: Family  History  Problem Relation Age of Onset  . Heart disease Father   . Diabetes Mother   . Hypertension Mother     Social History:  reports that she has never smoked. She has never used smokeless tobacco. She reports that she does not drink alcohol or use drugs.  ROS: UROLOGY Frequent Urination?: No Hard to postpone urination?: No Burning/pain with urination?: No Get up at night to urinate?: No Leakage of urine?: No Urine stream starts and stops?: No Trouble starting stream?: No Do you have to strain to urinate?: No Blood in urine?: No Urinary tract infection?: No Sexually transmitted disease?: No Injury to kidneys or bladder?: No Painful intercourse?: No Weak stream?: No Currently pregnant?: No Vaginal bleeding?: No Last menstrual period?: Postmenopausal  Gastrointestinal Nausea?: No Vomiting?: No Indigestion/heartburn?: No Diarrhea?: No Constipation?: No  Constitutional Fever: No Night sweats?: No Weight loss?: No Fatigue?: No  Skin Skin rash/lesions?: No Itching?: No  Eyes Blurred vision?: No  Double vision?: No  Ears/Nose/Throat Sore throat?: No Sinus problems?: No  Hematologic/Lymphatic Swollen glands?: No Easy bruising?: No  Cardiovascular Leg swelling?: No Chest pain?: No  Respiratory Cough?: No Shortness of breath?: No  Endocrine Excessive thirst?: No  Musculoskeletal Back pain?: No Joint pain?: No  Neurological Headaches?: No Dizziness?: No  Psychologic Depression?: No Anxiety?: No  Physical Exam: BP 132/72 (BP Location: Left Arm, Patient Position: Sitting, Cuff Size: Large)   Pulse 66   Ht 5\' 4"  (1.626 m)   Wt 268 lb (121.6 kg)   BMI 46.00 kg/m   Constitutional:  Alert and oriented, No acute distress. Respiratory: Normal respiratory effort, no increased work of breathing. Head: Normocephalic and Atraumatic. GU: No CVA tenderness Skin: No rashes, bruises or suspicious lesions. Neurologic: Grossly intact, no focal  deficits, moving all 4 extremities. Psychiatric: Normal mood and affect.  Pertinent Imaging: CLINICAL DATA:  Renal cyst.  EXAM: RENAL / URINARY TRACT ULTRASOUND COMPLETE  COMPARISON:  CT 02/01/2016 and MRI 11/21/2017  FINDINGS: Right Kidney:  Renal measurements: 4.8 x 4.7 x 9.6 cm = volume: 111 mL . Echogenicity within normal limits. No mass or hydronephrosis visualized.  Left Kidney:  Renal measurements: 4.8 x 5.2 x 9.7 cm = volume: 125 mL. Echogenicity within normal limits. No mass or hydronephrosis visualized. 1.6 cm cyst over the lower pole.  Bladder:  Appears normal for degree of bladder distention.  IMPRESSION: Normal size kidneys without hydronephrosis.  1.6 cm left renal cyst.   Electronically Signed   By: Marin Olp M.D.   On: 08/23/2018 20:48 I have personally reviewed all the images today.  Assessment & Plan:   1. Bosniak 2 Renal Cyst  - Stable since 2017  - No further imaging is needed. Patient is agreeable with plan  Return for as needed with urinary symptoms.  Abbie Sons, MD [place] [address], [place], [place] [address] [phone]  I, [phone] Renne Crigler , am acting as a scribe for Abbie Sons, MD  I, Abbie Sons, MD, have reviewed all documentation for this visit. The documentation on 08/27/18 for the exam, diagnosis, procedures, and orders are all accurate and complete.

## 2018-08-27 ENCOUNTER — Ambulatory Visit: Payer: Medicare Other | Admitting: Urology

## 2018-08-27 ENCOUNTER — Encounter: Payer: Self-pay | Admitting: Urology

## 2018-08-27 VITALS — BP 132/72 | HR 66 | Ht 64.0 in | Wt 268.0 lb

## 2018-08-27 DIAGNOSIS — N281 Cyst of kidney, acquired: Secondary | ICD-10-CM

## 2018-09-20 ENCOUNTER — Ambulatory Visit (INDEPENDENT_AMBULATORY_CARE_PROVIDER_SITE_OTHER): Payer: Medicare Other

## 2018-09-20 ENCOUNTER — Other Ambulatory Visit: Payer: Self-pay

## 2018-09-20 DIAGNOSIS — I340 Nonrheumatic mitral (valve) insufficiency: Secondary | ICD-10-CM

## 2018-09-20 MED ORDER — PERFLUTREN LIPID MICROSPHERE
1.0000 mL | INTRAVENOUS | Status: AC | PRN
  Administered 2018-09-20: 2 mL via INTRAVENOUS

## 2018-09-25 ENCOUNTER — Telehealth: Payer: Self-pay

## 2018-09-25 NOTE — Telephone Encounter (Signed)
-----   Message from Rise Mu, PA-C sent at 09/20/2018  7:35 PM EDT ----- Echo showed normal pump function with slightly stiffened heart. Pressure on the side of the heart that feeds the lungs was elevated at 54, normal is < 30. Moderately leaky tricuspid valve, mildly leaky mitral valve.   Compared to prior echo from 2018, pump function and slightly stiffened heart are unchanged. The pressure in the right side of the heart was previously normal and is now elevated. If she continues to be asymptomatic, no changes at this time given current medical climate with COVID-19. If she has noted any worsening SOB, we should change her HCTZ to Lasix 20 mg daily with follow up BMET 1 week after starting Lasix. As COVID cases decrease, she will need work up for pulmonary hypertension.

## 2018-09-25 NOTE — Telephone Encounter (Signed)
Call to patient to review results from Echo.   She verbalized understanding and will try to keep a heart healthy, low sodium diet.   Advised pt to call for any further questions or concerns.

## 2019-05-17 ENCOUNTER — Other Ambulatory Visit: Payer: Self-pay | Admitting: Internal Medicine

## 2019-05-17 DIAGNOSIS — Z1231 Encounter for screening mammogram for malignant neoplasm of breast: Secondary | ICD-10-CM

## 2019-06-24 ENCOUNTER — Ambulatory Visit
Admission: RE | Admit: 2019-06-24 | Discharge: 2019-06-24 | Disposition: A | Discharge status: Home / Self Care | Payer: Medicare Other | Source: Ambulatory Visit | Attending: Internal Medicine | Admitting: Internal Medicine

## 2019-06-24 DIAGNOSIS — Z1231 Encounter for screening mammogram for malignant neoplasm of breast: Secondary | ICD-10-CM | POA: Insufficient documentation

## 2019-07-02 IMAGING — US US RENAL
1 series · 14 of 25 positions shown · non-contrast
Comparison: MRI 03/09/2016.  CT 02/01/2016

CLINICAL DATA: Renal mass.

EXAM:
RENAL / URINARY TRACT ULTRASOUND COMPLETE

[Series 1: us renal · 0.28mm/px · 14 of 36 slices shown]
[im 1/36]
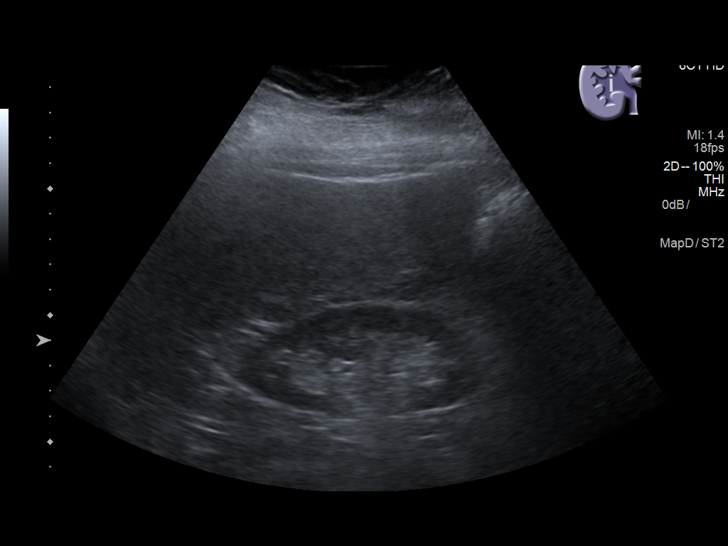
[im 3/36]
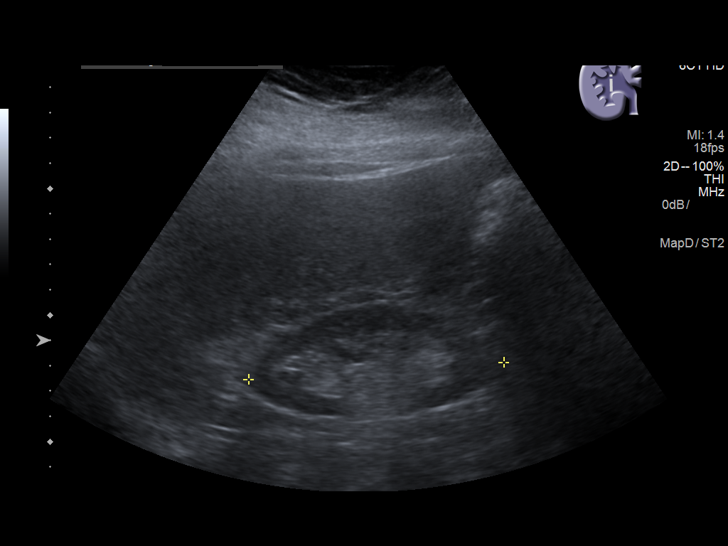
[im 6/36]
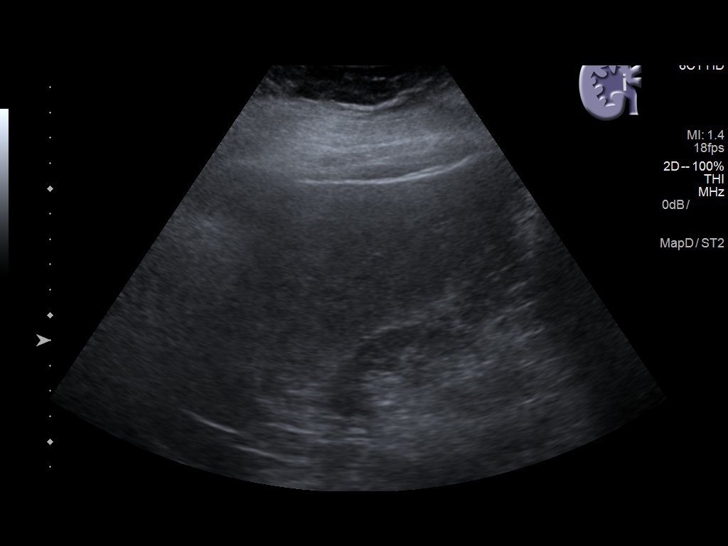
[im 9/36]
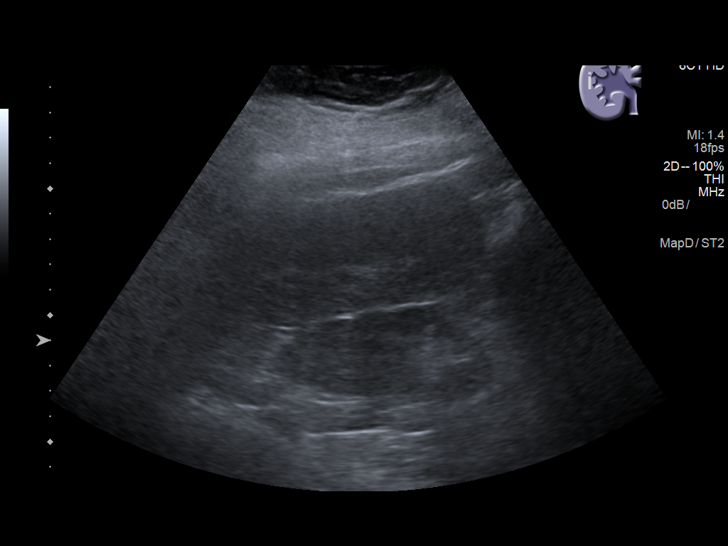
[im 12/36]
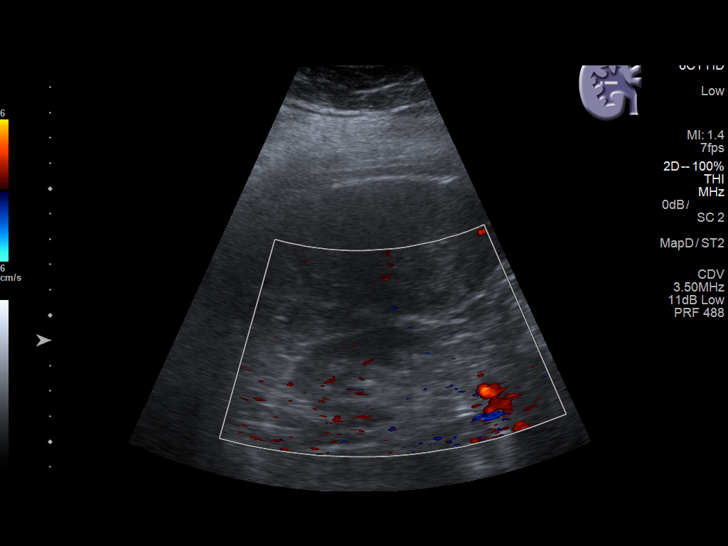
[im 14/36]
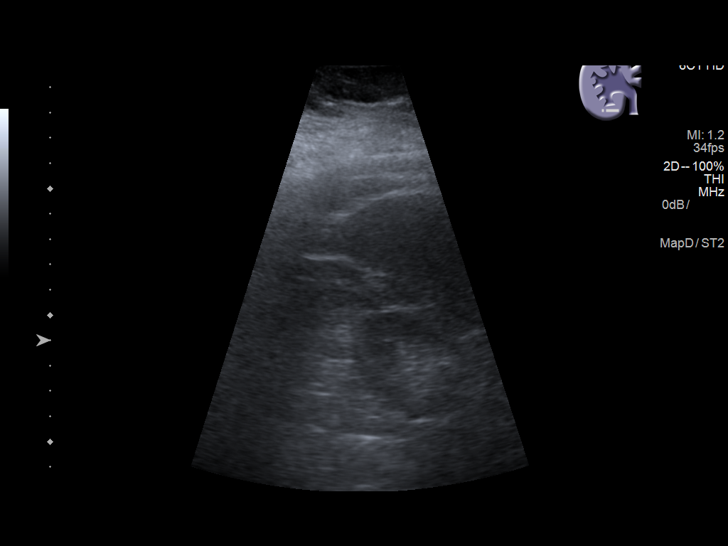
[im 17/36]
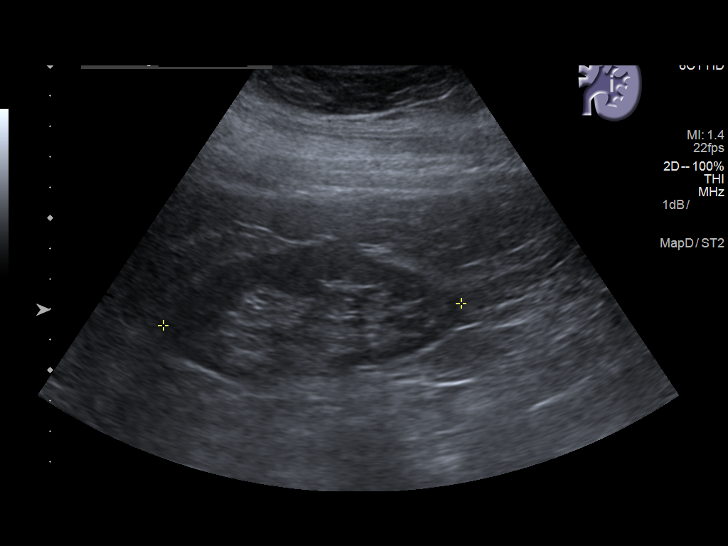
[im 19/36]
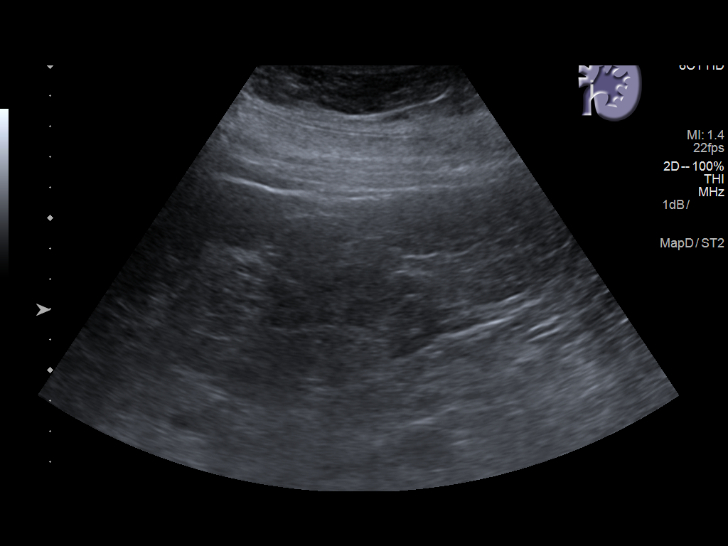
[im 22/36]
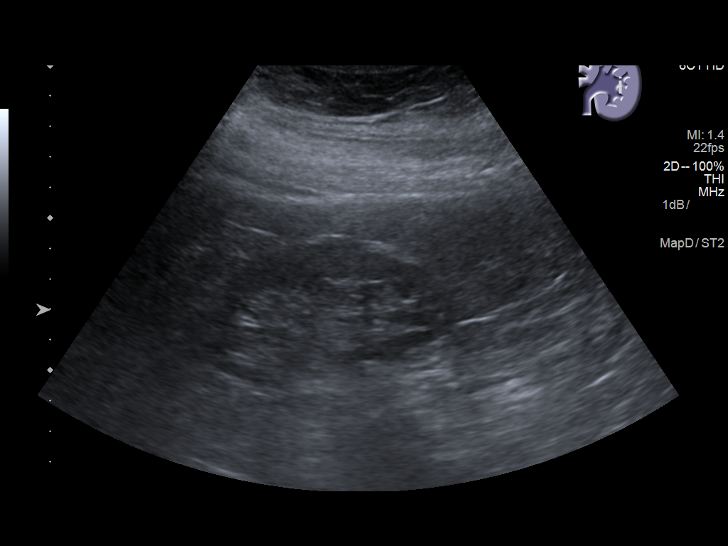
[im 24/36]
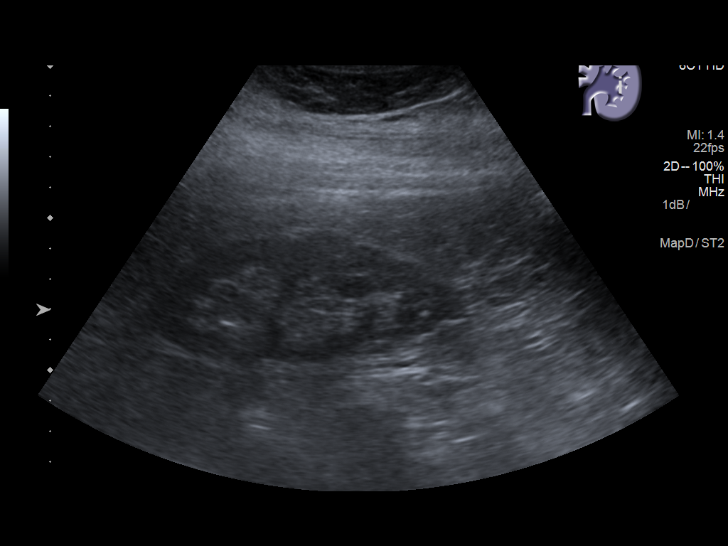
[im 27/36]
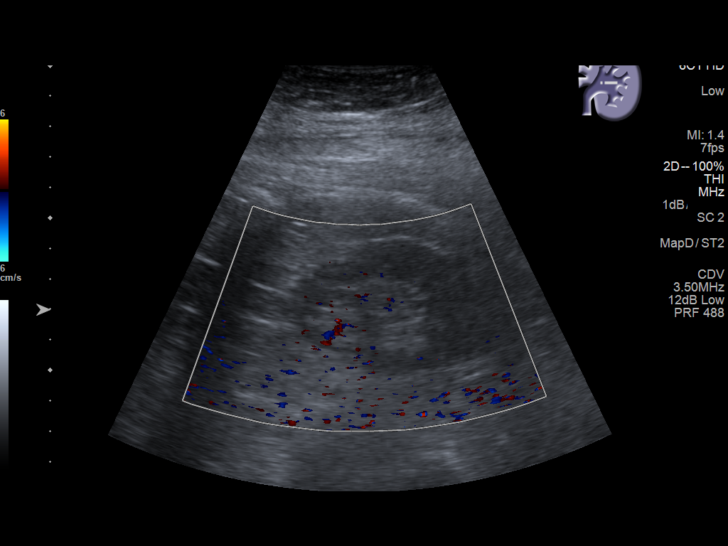
[im 30/36]
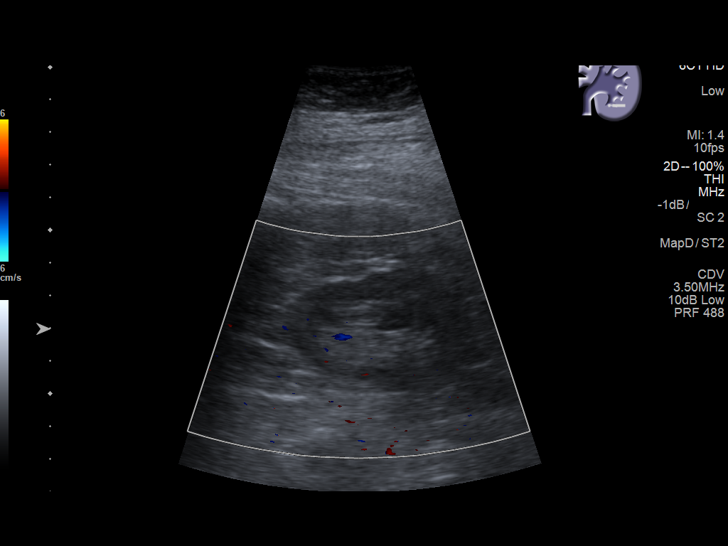
[im 33/36]
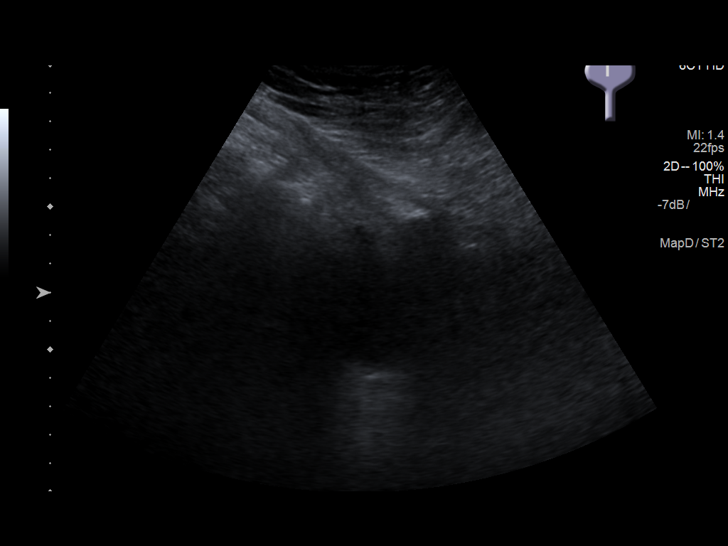
[im 36/36]
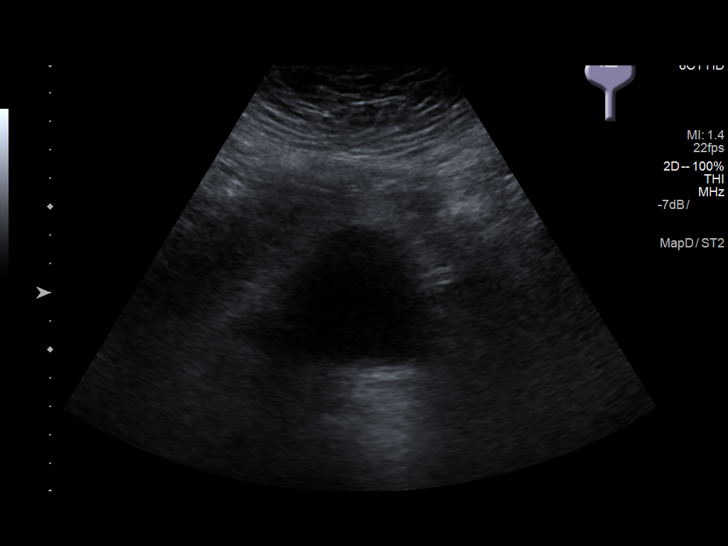

[14 of 25 positions shown; findings below may reference images not displayed]

FINDINGS: Right Kidney:

Length: 10.1 cm. Echogenicity within normal limits. No mass or
hydronephrosis visualized.

Left Kidney:

Length: 9.8 cm. Echogenicity within normal limits. No hydronephrosis
visualized. 1.5 cm complex cyst left lower renal pole. Minimal
enlargement from prior exam cannot be excluded. Follow-up
gadolinium-enhanced MRI of the kidneys can be obtained to further
evaluate . Minimal interim growth from prior exam cannot be
excluded. Follow-up gadolinium-enhanced MRI of the kidneys can be
obtained for further evaluation and for comparison to prior MRI at
03/09/2016. Given the lack of prominent interval growth, the lesion
is most likely benign.

Bladder:

Appears normal for degree of bladder distention.
IMPRESSION: 1.5 cm complex cyst left lower renal pole. Minimal enlargement from
prior exams cannot be excluded. Follow-up gadolinium-enhanced MRI of
the kidneys can be obtained for further evaluation and for
comparison to prior MRI of 03/09/2016. Given the lack of prominent
growth interval growth, the lesion is most likely benign.

## 2019-08-05 ENCOUNTER — Other Ambulatory Visit: Payer: Self-pay | Admitting: Physician Assistant

## 2019-08-05 NOTE — Telephone Encounter (Signed)
Called to schedule fu, no answer or vm

## 2019-08-05 NOTE — Telephone Encounter (Signed)
Please schedule overdue F/U. Thank you! 

## 2019-08-16 NOTE — Telephone Encounter (Signed)
Attempted to schedule no ans no vm  

## 2019-08-18 ENCOUNTER — Other Ambulatory Visit: Payer: Self-pay | Admitting: Physician Assistant

## 2019-08-19 NOTE — Telephone Encounter (Signed)
Patient needs an appointment for further refills. If patient does not want to schedule an appointment please make them aware to contact PCP for refills. Thanks Ladies!

## 2019-08-19 NOTE — Telephone Encounter (Signed)
Scheduled on 2/17

## 2019-08-21 ENCOUNTER — Ambulatory Visit: Payer: Medicare Other | Admitting: Family

## 2019-08-21 ENCOUNTER — Other Ambulatory Visit: Payer: Self-pay

## 2019-08-21 ENCOUNTER — Encounter: Payer: Self-pay | Admitting: Family

## 2019-08-21 VITALS — BP 130/66 | HR 66 | Ht 64.0 in | Wt 285.0 lb

## 2019-08-21 DIAGNOSIS — M7989 Other specified soft tissue disorders: Secondary | ICD-10-CM | POA: Diagnosis not present

## 2019-08-21 DIAGNOSIS — I5189 Other ill-defined heart diseases: Secondary | ICD-10-CM | POA: Diagnosis not present

## 2019-08-21 DIAGNOSIS — E785 Hyperlipidemia, unspecified: Secondary | ICD-10-CM

## 2019-08-21 DIAGNOSIS — I272 Pulmonary hypertension, unspecified: Secondary | ICD-10-CM

## 2019-08-21 DIAGNOSIS — I2510 Atherosclerotic heart disease of native coronary artery without angina pectoris: Secondary | ICD-10-CM

## 2019-08-21 DIAGNOSIS — N183 Chronic kidney disease, stage 3 unspecified: Secondary | ICD-10-CM

## 2019-08-21 DIAGNOSIS — I10 Essential (primary) hypertension: Secondary | ICD-10-CM

## 2019-08-21 MED ORDER — ROSUVASTATIN CALCIUM 40 MG PO TABS: 40.0000 mg | ORAL_TABLET | Freq: Every day | ORAL | 1 refills | Status: DC

## 2019-08-21 MED ORDER — METOPROLOL SUCCINATE ER 100 MG PO TB24: 100.0000 mg | ORAL_TABLET | Freq: Every day | ORAL | 1 refills | Status: DC

## 2019-08-21 MED ORDER — DILTIAZEM HCL ER COATED BEADS 240 MG PO CP24: 480.0000 mg | ORAL_CAPSULE | Freq: Every day | ORAL | 1 refills | Status: DC

## 2019-08-21 NOTE — Patient Instructions (Signed)
Medication Instructions:  No medication changes today.  Refill sent for your Diltiazem, Crestor, and Metoprolol.  *If you need a refill on your cardiac medications before your next appointment, please call your pharmacy*  Lab Work: No lab work today. Labs from primary care looked good!  If you have labs (blood work) drawn today and your tests are completely normal, you will receive your results only by: Marland Kitchen MyChart Message (if you have MyChart) OR . A paper copy in the mail If you have any lab test that is abnormal or we need to change your treatment, we will call you to review the results.  Testing/Procedures: You had an EKG today. It showed sinus rhythm and was stable compared to your last EKG - this is a good result!  Follow-Up: At [facility], you and your health needs are our priority.  As part of our continuing mission to provide you with exceptional heart care, we have created designated Provider Care Teams.  These Care Teams include your primary Cardiologist (physician) and Advanced Practice Providers (APPs -  Physician Assistants and Nurse Practitioners) who all work together to provide you with the care you need, when you need it.  Your next appointment:   6 month(s)  The format for your next appointment:   In Person  Provider:   Ida Rogue, MD  Other Instructions   DASH Eating Plan DASH stands for "Dietary Approaches to Stop Hypertension." The DASH eating plan is a healthy eating plan that has been shown to reduce high blood pressure (hypertension). It may also reduce your risk for type 2 diabetes, heart disease, and stroke. The DASH eating plan may also help with weight loss. What are tips for following this plan?  General guidelines  Avoid eating more than 2,300 mg (milligrams) of salt (sodium) a day. If you have hypertension, you may need to reduce your sodium intake to 1,500 mg a day.  Limit alcohol intake to no more than 1 drink a day for nonpregnant  women and 2 drinks a day for men. One drink equals 12 oz of beer, 5 oz of wine, or 1 oz of hard liquor.  Work with your health care provider to maintain a healthy body weight or to lose weight. Ask what an ideal weight is for you.  Get at least 30 minutes of exercise that causes your heart to beat faster (aerobic exercise) most days of the week. Activities may include walking, swimming, or biking.  Work with your health care provider or diet and nutrition specialist (dietitian) to adjust your eating plan to your individual calorie needs. Reading food labels   Check food labels for the amount of sodium per serving. Choose foods with less than 5 percent of the Daily Value of sodium. Generally, foods with less than 300 mg of sodium per serving fit into this eating plan.  To find whole grains, look for the word "whole" as the first word in the ingredient list. Shopping  Buy products labeled as "low-sodium" or "no salt added."  Buy fresh foods. Avoid canned foods and premade or frozen meals. Cooking  Avoid adding salt when cooking. Use salt-free seasonings or herbs instead of table salt or sea salt. Check with your health care provider or pharmacist before using salt substitutes.  Do not fry foods. Cook foods using healthy methods such as baking, boiling, grilling, and broiling instead.  Cook with heart-healthy oils, such as olive, canola, soybean, or sunflower oil. Meal planning  Eat a balanced diet  that includes: ? 5 or more servings of fruits and vegetables each day. At each meal, try to fill half of your plate with fruits and vegetables. ? Up to 6-8 servings of whole grains each day. ? Less than 6 oz of lean meat, poultry, or fish each day. A 3-oz serving of meat is about the same size as a deck of cards. One egg equals 1 oz. ? 2 servings of low-fat dairy each day. ? A serving of nuts, seeds, or beans 5 times each week. ? Heart-healthy fats. Healthy fats called Omega-3 fatty acids  are found in foods such as flaxseeds and coldwater fish, like sardines, salmon, and mackerel.  Limit how much you eat of the following: ? Canned or prepackaged foods. ? Food that is high in trans fat, such as fried foods. ? Food that is high in saturated fat, such as fatty meat. ? Sweets, desserts, sugary drinks, and other foods with added sugar. ? Full-fat dairy products.  Do not salt foods before eating.  Try to eat at least 2 vegetarian meals each week.  Eat more home-cooked food and less restaurant, buffet, and fast food.  When eating at a restaurant, ask that your food be prepared with less salt or no salt, if possible. What foods are recommended? The items listed may not be a complete list. Talk with your dietitian about what dietary choices are best for you. Grains Whole-grain or whole-wheat bread. Whole-grain or whole-wheat pasta. Brown rice. [place]. Bulgur. Whole-grain and low-sodium cereals. Pita bread. Low-fat, low-sodium crackers. Whole-wheat flour tortillas. Vegetables Fresh or frozen vegetables (raw, steamed, roasted, or grilled). Low-sodium or reduced-sodium tomato and vegetable juice. Low-sodium or reduced-sodium tomato sauce and tomato paste. Low-sodium or reduced-sodium canned vegetables. Fruits All fresh, dried, or frozen fruit. Canned fruit in natural juice (without added sugar). Meat and other protein foods Skinless chicken or [place]. Ground chicken or [place]. Pork with fat trimmed off. Fish and seafood. Egg whites. Dried beans, peas, or lentils. Unsalted nuts, nut butters, and seeds. Unsalted canned beans. Lean cuts of beef with fat trimmed off. Low-sodium, lean deli meat. Dairy Low-fat (1%) or fat-free (skim) milk. Fat-free, low-fat, or reduced-fat cheeses. Nonfat, low-sodium ricotta or cottage cheese. Low-fat or nonfat yogurt. Low-fat, low-sodium cheese. Fats and oils Soft margarine without trans fats. Vegetable oil. Low-fat, reduced-fat, or light  mayonnaise and salad dressings (reduced-sodium). Canola, safflower, olive, soybean, and sunflower oils. Avocado. Seasoning and other foods Herbs. Spices. Seasoning mixes without salt. Unsalted popcorn and pretzels. Fat-free sweets. What foods are not recommended? The items listed may not be a complete list. Talk with your dietitian about what dietary choices are best for you. Grains Baked goods made with fat, such as croissants, muffins, or some breads. Dry pasta or rice meal packs. Vegetables Creamed or fried vegetables. Vegetables in a cheese sauce. Regular canned vegetables (not low-sodium or reduced-sodium). Regular canned tomato sauce and paste (not low-sodium or reduced-sodium). Regular tomato and vegetable juice (not low-sodium or reduced-sodium). Angie Fava. Olives. Fruits Canned fruit in a light or heavy syrup. Fried fruit. Fruit in cream or butter sauce. Meat and other protein foods Fatty cuts of meat. Ribs. Fried meat. Berniece Salines. Sausage. Bologna and other processed lunch meats. Salami. Fatback. Hotdogs. Bratwurst. Salted nuts and seeds. Canned beans with added salt. Canned or smoked fish. Whole eggs or egg yolks. Chicken or [place] with skin. Dairy Whole or 2% milk, cream, and half-and-half. Whole or full-fat cream cheese. Whole-fat or sweetened yogurt. Full-fat cheese. Nondairy creamers.  Whipped toppings. Processed cheese and cheese spreads. Fats and oils Butter. Stick margarine. Lard. Shortening. Ghee. Bacon fat. Tropical oils, such as coconut, palm kernel, or palm oil. Seasoning and other foods Salted popcorn and pretzels. Onion salt, garlic salt, seasoned salt, table salt, and sea salt. Worcestershire sauce. Tartar sauce. Barbecue sauce. Teriyaki sauce. Soy sauce, including reduced-sodium. Steak sauce. Canned and packaged gravies. Fish sauce. Oyster sauce. Cocktail sauce. Horseradish that you find on the shelf. Ketchup. Mustard. Meat flavorings and tenderizers. Bouillon cubes. Hot sauce and  Tabasco sauce. Premade or packaged marinades. Premade or packaged taco seasonings. Relishes. Regular salad dressings. Where to find more information:  National Heart, Lung, and [place]: https://wilson-eaton.com/  American Heart Association: www.heart.org Summary  The DASH eating plan is a healthy eating plan that has been shown to reduce high blood pressure (hypertension). It may also reduce your risk for type 2 diabetes, heart disease, and stroke.  With the DASH eating plan, you should limit salt (sodium) intake to 2,300 mg a day. If you have hypertension, you may need to reduce your sodium intake to 1,500 mg a day.  When on the DASH eating plan, aim to eat more fresh fruits and vegetables, whole grains, lean proteins, low-fat dairy, and heart-healthy fats.  Work with your health care provider or diet and nutrition specialist (dietitian) to adjust your eating plan to your individual calorie needs. This information is not intended to replace advice given to you by your health care provider. Make sure you discuss any questions you have with your health care provider. Document Revised: 06/02/2017 Document Reviewed: 06/13/2016 Elsevier Patient Education  2020 [employer].

## 2019-08-21 NOTE — Progress Notes (Signed)
Office Visit    Patient Name: Melissa Christian Date of Encounter: 08/21/2019  Primary Care Provider:  Rusty Aus, MD Primary Cardiologist:  Ida Rogue, MD Electrophysiologist:  None   Chief Complaint    Melissa Christian is a 79 y.o. female with a hx of CAD with NSTEMI 09/2015 with PCI/DES to mid LAD, CKD III, DM2, HTN, HLD, AI, MR presents today for follow up of CAD   Past Medical History    Past Medical History:  Diagnosis Date  . Anemia   . CAD (coronary artery disease)    a. NSTEMI 09/2015: cardiac cath: LM no obs dz, mLAD 95% s/p PCI/DES 0%, LCx no obs, RCA no obs, LVEF 55-65%  . Chronic kidney disease (CKD), stage III (moderate)   . Diabetes mellitus with complication ([place])   . HLD (hyperlipidemia)   . Hypertension   . Low blood potassium   . NSTEMI (non-ST elevated myocardial infarction) ([place])   . PONV (postoperative nausea and vomiting)   . Valvular heart disease    a. 09/2015: EF 55-60%, challenging images unable to exclude mild mid-distal anterior to apical HK, nl LV dias fxn, mild AI/MR, normal size of left atrium, RV systolic function normal, PASP normal     Past Surgical History:  Procedure Laterality Date  . BACK SURGERY    . CARDIAC CATHETERIZATION N/A 09/23/2015   Procedure: Right and Left Heart Cath;  Surgeon: Minna Merritts, MD;  Location: [place] [facility];  Service: Cardiovascular;  Laterality: N/A;  . CARDIAC CATHETERIZATION N/A 09/23/2015   Procedure: Coronary Stent Intervention;  Surgeon: Yolonda Kida, MD;  Location: [place] [facility];  Service: Cardiovascular;  Laterality: N/A;  . CATARACT EXTRACTION W/PHACO Left 04/26/2017   Procedure: CATARACT EXTRACTION PHACO AND INTRAOCULAR LENS PLACEMENT ([place]) LEFT DIABETIC;  Surgeon: Leandrew Koyanagi, MD;  Location: [place];  Service: Ophthalmology;  Laterality: Left;  diabetic-oral meds  . CATARACT EXTRACTION W/PHACO Right 05/31/2017   Procedure: CATARACT EXTRACTION PHACO AND  INTRAOCULAR LENS PLACEMENT ([place]) RIGHT DIABETIC;  Surgeon: Leandrew Koyanagi, MD;  Location: [place];  Service: Ophthalmology;  Laterality: Right;  . CHOLECYSTECTOMY N/A 06/16/2016   Procedure: LAPAROSCOPIC CHOLECYSTECTOMY WITH INTRAOPERATIVE CHOLANGIOGRAM;  Surgeon: Leonie Green, MD;  Location: [facility];  Service: General;  Laterality: N/A;  . CORONARY STENT PLACEMENT    . JOINT REPLACEMENT    . LUMBAR FUSION    . TOTAL HIP ARTHROPLASTY      Allergies  Allergies  Allergen Reactions  . Ferrous Sulfate Itching  . Keflex [Cephalexin] Itching  . Percocet [Oxycodone-Acetaminophen] Itching  . Vicodin [Hydrocodone-Acetaminophen] Itching  . Ace Inhibitors Rash    elevated potassium at higher doses  . Penicillins Rash    Has patient had a PCN reaction causing immediate rash, facial/tongue/throat swelling, SOB or lightheadedness with hypotension: No Has patient had a PCN reaction causing severe rash involving mucus membranes or skin necrosis: No Has patient had a PCN reaction that required hospitalization No Has patient had a PCN reaction occurring within the last 10 years: No If all of the above answers are "NO", then may proceed with Cephalosporin use.    History of Present Illness    Melissa Christian is a 79 y.o. female with a hx of  CAD with NSTEMI 09/2015 with PCI/DES to mid LAD, CKD III, DM2, HTN, HLD, AI, MR. She was last seen 08/20/18 by Christell Faith, PA.  Admitted 09/2015 with NSTEMI. LHC 09/23/15 with LM  no significant disease, mid LAD 95% s/p PCI/DES, LCx and RCA without significant disease. Echo 09/23/15 with EF 55-60%, challenging image quality, unable to exclude mild mid to distal anterior wall and apical hypokinesis, normal LV diastolic function parameters, mild AI/MR, normal size LA, RV systolic function normal, PASP normal. Admitted 06/2016 wit cholelithiasis, underwent successful laproscopic cholecystectomy after holding Brilinta x5 days (Aspirin was continued)  followed by resumed DAPT. Office visit 06/19/17 with symptomatic hypotension, Losartan and HCTZ stopped, BMET with worsening renal function. Echo 07/03/17 EF 60-65%, no RWMA, gr1DD, mild AI, mild MR, normal RV systolic function, PASP normal. Lexiscan Myoview 07/18/17 for atypical chest discomfort which was low risk. Repeat echo 09/2018 for monitoring of valvular heart disease with LVEF 81-19%, diastolic dysfunction, RV normal systolic function, RVSP moderately elevated at 54.6, moderate TR, mild MR.  Labs via Care Everywhere 07/18/19:  Hb 12, creatinine 2.2, GFR 22, K 4.3, AST 19, ALT 12, A1c 5.9  Total cholesterol 136, HDL 58.3, LDL 56, triglycerides 108   Pleasant lady. Reports feeling overall well. No chest pain, pressure, tightness. Tells me she has no SOB at rest. Does notice some dyspnea with more than usual activity but tells me this is at her baseline. Endorses not being very active during the pandemic.   Has been following with her new PCP Dr. Sabra Heck very closely. Her Lasix was changed from PRN to 20mg  daily. Reports her DOE and edema have improved with this change.   No orthopnea, PND, palpitations.   EKGs/Labs/Other Studies Reviewed:   The following studies were reviewed today: Echo 09/2018  1. The left ventricle has normal systolic function with an ejection  fraction of 60-65%. The cavity size was normal. Left ventricular diastolic  Doppler parameters are consistent with pseudonormalization.   2. The right ventricle has normal systolic function. The cavity was  normal. There is no increase in right ventricular wall thickness. Right  ventricular systolic pressure is moderately elevated with an estimated  pressure of 54.6 mmHg.   3. Tricuspid valve regurgitation is moderate.   EKG:  EKG is ordered today.  The ekg ordered today demonstrates SR 66 bpm with 1st degree AV block (PR220) and known bifasicular heart block (LAFB, RBBB)  Recent Labs: No results found for requested labs  within last 8760 hours.  Recent Lipid Panel    Component Value Date/Time   CHOL 174 09/24/2015 0647   TRIG 107 09/24/2015 0647   HDL 48 09/24/2015 0647   CHOLHDL 3.6 09/24/2015 0647   VLDL 21 09/24/2015 0647   LDLCALC 105 (H) 09/24/2015 0647    Home Medications   Current Meds  Medication Sig  . aspirin EC 81 MG tablet Take 81 mg by mouth daily.  . B Complex-C-Folic Acid (RENA-VITE RX) 1 MG TABS Take 1 tablet by mouth daily.  . Cholecalciferol (VITAMIN D3) 2000 units TABS Take 2,000 Units by mouth.  . diltiazem (CARDIZEM CD) 240 MG 24 hr capsule Take 2 capsules (480 mg total) by mouth daily.  . ferrous sulfate 325 (65 FE) MG tablet Take 325 mg by mouth 2 (two) times daily.  . furosemide (LASIX) 40 MG tablet Take 20 mg by mouth daily.   Marland Kitchen glipiZIDE (GLUCOTROL) 10 MG tablet Take 20 mg by mouth 2 (two) times daily before a meal.  . metoprolol succinate (TOPROL-XL) 100 MG 24 hr tablet Take 1 tablet (100 mg total) by mouth daily. Take with or immediately following a meal.  . pioglitazone (ACTOS) 30 MG tablet Take 30  mg by mouth daily.   . potassium chloride (K-DUR,KLOR-CON) 10 MEQ tablet Take 10 mEq by mouth as needed (when taking Lasix).   . rosuvastatin (CRESTOR) 40 MG tablet Take 1 tablet (40 mg total) by mouth daily. Please call to schedule office visit for further refills. Thank you!  Marland Kitchen triamterene-hydrochlorothiazide (DYAZIDE) 37.5-25 MG capsule Take 1 capsule by mouth every other day.  . TRULICITY 1.5 HQ/7.5FF SOPN INJECT 1.5 MG SUBCUTANEOUSLY EVERY 7 (SEVEN) DAYS  . [DISCONTINUED] diltiazem (CARDIZEM CD) 240 MG 24 hr capsule Take 2 capsules (480 mg total) by mouth daily.  . [DISCONTINUED] metoprolol succinate (TOPROL-XL) 100 MG 24 hr tablet TAKE 1 TABLET (100 MG TOTAL) BY MOUTH DAILY. TAKE WITH OR IMMEDIATELY FOLLOWING A MEAL.  . [DISCONTINUED] rosuvastatin (CRESTOR) 40 MG tablet Take 1 tablet (40 mg total) by mouth daily. Please call to schedule office visit for further refills.  Thank you!      Review of Systems       Review of Systems  Constitution: Negative for chills, fever and malaise/fatigue.  Cardiovascular: Positive for dyspnea on exertion (at baseline) and leg swelling. Negative for chest pain, near-syncope, orthopnea, palpitations and syncope.  Respiratory: Negative for cough, shortness of breath and wheezing.   Gastrointestinal: Negative for nausea and vomiting.  Neurological: Negative for dizziness, light-headedness and weakness.   All other systems reviewed and are otherwise negative except as noted above.  Physical Exam    VS:  BP 130/66 (BP Location: Left Arm, Patient Position: Sitting, Cuff Size: Normal)   Pulse 66   Ht 5\' 4"  (1.626 m)   Wt 285 lb (129.3 kg)   SpO2 97%   BMI 48.92 kg/m  , BMI Body mass index is 48.92 kg/m. GEN: Well nourished, overweight, well developed, in no acute distress. HEENT: normal. Neck: Supple, no JVD, carotid bruits, or masses. Cardiac: RRR, no rubs, or gallops. Gr 2/6 murmur at apex.  No clubbing, cyanosis.  Radial/PT 2+ and equal bilaterally. Trace pedal edema.  Respiratory:  Respirations regular and unlabored, clear to auscultation bilaterally. GI: Soft, nontender, nondistended, BS + x 4. MS: No deformity or atrophy. Skin: Warm and dry, no rash. Neuro:  Strength and sensation are intact. Psych: Normal affect.  Assessment & Plan    1. CAD - Stable with no anginal symptoms. No indication for ischemic evaluation at this time. Continue GDMT aspirin, beta blocker, and statin.   2. Diastolic dysfunction/Pulmonary hypertension - Euvolemic and well compensated on exam today. Continue Lasix 20mg  daily as prescribed by PCP. If she requires further titration of diuretics may benefit from seeing nephrology due to CKD IV. If SOB worsens, anticipate need for RHC for evaluation of her pulmonary hypertension.   3. Valvular heart disease - Echo 09/2018 LVEF 63-84%, diastolic dysfuncion, moderate TR, mild MR. Her valvular  dysfunction is overall asymptomatic. Continue optimization of BP and fluid volume.   4. LE edema - Reports it is stable at her baseline. Continue Lasix 20mg  daily. Recommend elevating lower extremities when sitting. Recommend low sodium diet.   5. CKD IV - Lab review creatinine baseline approx 2 and GFR 23. Careful titration of diuretics and antihypertensive agents.   6. HTN - BP well controlled. Continue Cardizem 240mg  daily, Lasix 20mg  daily, Toprol 100mg  daily, Triameterne-HCTZ 37.5-25. Tells me she is taking Triamterene-HCTZ every other day at recommendation of PCP. Have encouraged her to keep a daily log of BP to determine if she needs daily. Refill of Cardizem and Toprol provided today.  7. HLD, LDL goal <70 - Lipid panel 07/18/19 with LDL 56. Normal liver function 07/18/19. Continue Crestor 40mg  daily, refill provided.   8. DM2 - Follows with PCP. Would strongly consider alternative to Actos due to its negative correlation with heart failure. Consider SGLT2i for cardioprotective benefit.  9. Obesity - Weight loss via diet and exercise encouraged. We discussed that due to orthopedic issues she likely will have to focus on dietary changes.   Disposition: Follow up in 6 month(s) with Dr. Rockey Situ or APP   Loel Dubonnet, NP 08/21/2019, 1:09 PM

## 2020-02-02 ENCOUNTER — Other Ambulatory Visit: Payer: Self-pay | Admitting: Family

## 2020-02-02 DIAGNOSIS — E785 Hyperlipidemia, unspecified: Secondary | ICD-10-CM

## 2020-02-18 ENCOUNTER — Ambulatory Visit: Payer: Medicare Other | Admitting: Cardiovascular Disease

## 2020-02-24 ENCOUNTER — Encounter: Payer: Self-pay | Admitting: Physician Assistant

## 2020-02-24 ENCOUNTER — Ambulatory Visit: Payer: Medicare Other | Admitting: Physician Assistant

## 2020-02-24 ENCOUNTER — Other Ambulatory Visit: Payer: Self-pay

## 2020-02-24 VITALS — BP 118/80 | HR 78 | Ht 64.0 in | Wt 273.5 lb

## 2020-02-24 DIAGNOSIS — I340 Nonrheumatic mitral (valve) insufficiency: Secondary | ICD-10-CM

## 2020-02-24 DIAGNOSIS — I5189 Other ill-defined heart diseases: Secondary | ICD-10-CM

## 2020-02-24 DIAGNOSIS — E785 Hyperlipidemia, unspecified: Secondary | ICD-10-CM

## 2020-02-24 DIAGNOSIS — I2510 Atherosclerotic heart disease of native coronary artery without angina pectoris: Secondary | ICD-10-CM | POA: Diagnosis not present

## 2020-02-24 DIAGNOSIS — N183 Chronic kidney disease, stage 3 unspecified: Secondary | ICD-10-CM

## 2020-02-24 DIAGNOSIS — I10 Essential (primary) hypertension: Secondary | ICD-10-CM

## 2020-02-24 DIAGNOSIS — I272 Pulmonary hypertension, unspecified: Secondary | ICD-10-CM

## 2020-02-24 DIAGNOSIS — I351 Nonrheumatic aortic (valve) insufficiency: Secondary | ICD-10-CM

## 2020-02-24 NOTE — Progress Notes (Signed)
Office Visit    Patient Name: Melissa Christian Date of Encounter: 02/24/2020  Primary Care Provider:  Rusty Aus, MD Primary Cardiologist:  Melissa Rogue, MD  Chief Complaint    Chief Complaint  Patient presents with   other    6 month follow up. Meds reviewed by the pt. verbally. "doing well."     79 year old female with history of CAD with NSTEMI 09/2015 with PCI/DES to the mid LAD, CKD3, DM2, hypertension, hyperlipidemia, AI, MR, and who presents today for 6 month follow-up of CAD.  Past Medical History    Past Medical History:  Diagnosis Date   Anemia    CAD (coronary artery disease)    a. NSTEMI 09/2015: cardiac cath: LM no obs dz, mLAD 95% s/p PCI/DES 0%, LCx no obs, RCA no obs, LVEF 55-65%   Chronic kidney disease (CKD), stage III (moderate)    Diabetes mellitus with complication (HCC)    HLD (hyperlipidemia)    Hypertension    Low blood potassium    NSTEMI (non-ST elevated myocardial infarction) (HCC)    PONV (postoperative nausea and vomiting)    Valvular heart disease    a. 09/2015: EF 55-60%, challenging images unable to exclude mild mid-distal anterior to apical HK, nl LV dias fxn, mild AI/MR, normal size of left atrium, RV systolic function normal, PASP normal     Past Surgical History:  Procedure Laterality Date   BACK SURGERY     CARDIAC CATHETERIZATION N/A 09/23/2015   Procedure: Right and Left Heart Cath;  Surgeon: Minna Merritts, MD;  Location: [facility];  Service: Cardiovascular;  Laterality: N/A;   CARDIAC CATHETERIZATION N/A 09/23/2015   Procedure: Coronary Stent Intervention;  Surgeon: Yolonda Kida, MD;  Location: [facility];  Service: Cardiovascular;  Laterality: N/A;   CATARACT EXTRACTION W/PHACO Left 04/26/2017   Procedure: CATARACT EXTRACTION PHACO AND INTRAOCULAR LENS PLACEMENT ([place]) LEFT DIABETIC;  Surgeon: Leandrew Koyanagi, MD;  Location: [facility];  Service: Ophthalmology;  Laterality:  Left;  diabetic-oral meds   CATARACT EXTRACTION W/PHACO Right 05/31/2017   Procedure: CATARACT EXTRACTION PHACO AND INTRAOCULAR LENS PLACEMENT ([place]) RIGHT DIABETIC;  Surgeon: Leandrew Koyanagi, MD;  Location: [facility];  Service: Ophthalmology;  Laterality: Right;   CHOLECYSTECTOMY N/A 06/16/2016   Procedure: LAPAROSCOPIC CHOLECYSTECTOMY WITH INTRAOPERATIVE CHOLANGIOGRAM;  Surgeon: Leonie Green, MD;  Location: [facility];  Service: General;  Laterality: N/A;   CORONARY STENT PLACEMENT     JOINT REPLACEMENT     LUMBAR FUSION     TOTAL HIP ARTHROPLASTY      Allergies  Allergies  Allergen Reactions   Ferrous Sulfate Itching   Keflex [Cephalexin] Itching   Percocet [Oxycodone-Acetaminophen] Itching   Vicodin [Hydrocodone-Acetaminophen] Itching   Ace Inhibitors Rash    elevated potassium at higher doses   Penicillins Rash    Has patient had a PCN reaction causing immediate rash, facial/tongue/throat swelling, SOB or lightheadedness with hypotension: No Has patient had a PCN reaction causing severe rash involving mucus membranes or skin necrosis: No Has patient had a PCN reaction that required hospitalization No Has patient had a PCN reaction occurring within the last 10 years: No If all of the above answers are "NO", then may proceed with Cephalosporin use.     History of Present Illness    Melissa Christian is a 79 y.o. female with PMH as above.    She was admitted 09/2015 with non-ST elevation myocardial infarction.  LHC  09/23/2015 with LM showing no significant disease, mLAD 95% s/p PCI/DES, LCx and RCA without significant disease.    Echo 09/23/2015 with EF 55 to 60%.  It was noted challenging imaging quality thus unable to exclude mild mid to distal anterior wall and apical hypokinesis.  Mild AI/MR.    She was admitted 06/2016 with cholelithiasis and underwent successful laparoscopic cholecystectomy.    Office visit 06/19/2017 with symptomatic  hypotension, losartan and HCTZ stopped, BMET with worsening renal function.    Echo 07/03/2017 EF 60 to 65%, no RWMA, G1DD, mild AI, mild MR, normal RVSP, PASP normal.  Lexiscan Myoview 07/18/2017 for atypical chest discomfort ruled low risk.  Repeat echo 09/2018 for monitoring of valvular heart disease with LVEF 60 to 65%, DD, normal RVSF, RVSP  54.67mmHg, moderate TR, moderate MR.  She was last evaluated 08/21/2019 by Laurann Montana, NP.  At that time, she felt well from a cardiac standpoint.  She did note some dyspnea though at her baseline.  She was not as active during the pandemic.  Her Lasix had recently been changed from 20 mg as needed to 20 mg daily.  With this change, she noted improved lower extremity edema.  Today, 02/24/2020, she returns to clinic and notes that her main reason for presenting today is for medication refill of her Crestor. She reports she is fine.  On review of all symptoms, she is not able to clarify details for most sx.  She reports she is sleeping in her recliner, though she states this is not new for her, and she is unable to clarify for how long she has been sleeping in her recliner and whether or not this is due to shortness of breath. She states she has been sleeping in her recliner "forever," and that "this is not new for her." She states that she " sleeps straight up" but "can also sleep at a low angle." She is unable to tell me if she can lay flat as she has "never tried." She states she is not sure why she sleeps in the recliner but that it is not new. She has LEE on exam but notes this is her baseline and due to sitting in the wheelchairs. She states it is not increased from her last visit, but also that she has not yet taken her lasix today, which could be the reason for her LEE. She is taking Lasix 20 mg as needed, rather than daily, (?changed back from PCP recommendation). She reports today that as needed is lately on a daily basis when attempting to clarify further with  follow-up questions. She states, however, that it has never been prescribed for her to take daily. She notes DOE, attributed to deconditioning, and not concerning to her. She is unable to state if this is changed from previous visits. She presents today in a wheelchair, stating her back pain limits her activity. She states she is sedentary. No desire for PT/OT. She otherwise denies chest pain, palpitations, pnd, n, v, dizziness, syncope, weight gain, or early satiety. She reports medication compliance, other than reducing her lasix to as needed (possibly taking it this way the entire time). No s/sx of bleeding.  Home Medications    Prior to Admission medications   Medication Sig Start Date End Date Taking? Authorizing Provider  aspirin EC 81 MG tablet Take 81 mg by mouth daily.   Yes [provider]  B Complex-C-Folic Acid (RENA-VITE RX) 1 MG TABS Take 1 tablet by mouth daily. 08/17/18  Yes [provider]  Cholecalciferol (VITAMIN D3) 2000 units TABS Take 2,000 Units by mouth.   Yes [provider]  diltiazem (CARDIZEM CD) 240 MG 24 hr capsule Take 2 capsules (480 mg total) by mouth daily. 08/21/19  Yes Loel Dubonnet, NP  ferrous sulfate 325 (65 FE) MG tablet Take 325 mg by mouth 2 (two) times daily. 05/30/18  Yes [provider]  furosemide (LASIX) 40 MG tablet Take 20 mg by mouth daily.    Yes [provider]  glipiZIDE (GLUCOTROL) 10 MG tablet Take 20 mg by mouth 2 (two) times daily before a meal.   Yes [provider]  metoprolol succinate (TOPROL-XL) 100 MG 24 hr tablet Take 1 tablet (100 mg total) by mouth daily. Take with or immediately following a meal. 08/21/19  Yes Loel Dubonnet, NP  pioglitazone (ACTOS) 30 MG tablet Take 30 mg by mouth daily.  05/07/18  Yes [provider]  potassium chloride (K-DUR,KLOR-CON) 10 MEQ tablet Take 10 mEq by mouth as needed (when taking Lasix).    Yes [provider]  rosuvastatin  (CRESTOR) 40 MG tablet TAKE 1 TABLET (40 MG TOTAL) BY MOUTH DAILY. PLEASE CALL TO SCHEDULE OFFICE VISIT FOR FURTHER REFILLS. THANK YOU! 02/03/20  Yes Loel Dubonnet, NP  triamterene-hydrochlorothiazide (DYAZIDE) 37.5-25 MG capsule Take 1 capsule by mouth every other day.   Yes [provider]  TRULICITY 1.5 JK/9.3OI SOPN INJECT 1.5 MG SUBCUTANEOUSLY EVERY 7 (SEVEN) DAYS 07/02/18  Yes [provider]    Review of Systems    She denies chest pain, palpitations, pnd, n, v, dizziness, syncope, weight gain, or early satiety. She denies orthopnea but is sleeping in a recliner, which she states is not new. She reports she is sedentary due to back pain.  She denies any increase from baseline edema and denies ever taking lasix daily per any MD instruction, though "lately taking her PRN lasix daily." She reports chronic edema as below noted on exam but also states she has not taken her lasix. She continues to note dyspnea on exertion though unable to make a comparison from her last visit. She reports her main reason for presenting is for medication refill   All other systems reviewed and are otherwise negative except as noted above.  Physical Exam    VS:  BP 118/80 (BP Location: Left Arm, Patient Position: Sitting, Cuff Size: Large)    Pulse 78    Ht 5\' 4"  (1.626 m)    Wt 273 lb 8 oz (124.1 kg)    SpO2 98%    BMI 46.95 kg/m  , BMI Body mass index is 46.95 kg/m. GEN: Obese female, NAD. HEENT: normal. Neck: Supple, JVD difficult to assess due to body habitus, carotid bruits, or masses. Cardiac: RRR, 2/6 systolic murmur. No rubs, or gallops. No clubbing, cyanosis. Bilateral moderate edema.  Radials/DP/PT 2+ and equal bilaterally.  Respiratory:  Distant breath sounds. Respirations regular and unlabored, clear to auscultation bilaterally. GI: Soft, nontender, nondistended, BS + x 4. MS: no deformity or atrophy. Skin: warm and dry, no rash. Neuro:  Strength and sensation are intact. Psych:  Normal affect.  Accessory Clinical Findings    ECG personally reviewed by me today - SR with 1st degree AVB, 78bpm, LAD, LAFB and RBBB (trifascicular block)  - no acute changes.  VITALS Reviewed today   Temp Readings from Last 3 Encounters:  05/31/17 (!) 97.3 F (36.3 C)  04/26/17 98.1 F (36.7 C)  06/16/16  97.6 F (36.4 C) (Temporal)   BP Readings from Last 3 Encounters:  02/24/20 118/80  08/21/19 130/66  08/27/18 132/72   Pulse Readings from Last 3 Encounters:  02/24/20 78  08/21/19 66  08/27/18 66    Wt Readings from Last 3 Encounters:  02/24/20 273 lb 8 oz (124.1 kg)  08/21/19 285 lb (129.3 kg)  08/27/18 268 lb (121.6 kg)     LABS  reviewed today   Lab Results  Component Value Date   WBC 10.4 06/07/2016   HGB 11.7 (L) 06/07/2016   HCT 35.3 06/07/2016   MCV 90.8 06/07/2016   PLT 318 06/07/2016   Lab Results  Component Value Date   CREATININE 1.69 (H) 06/28/2017   BUN 25 06/28/2017   NA 140 06/28/2017   K 5.1 06/28/2017   CL 101 06/28/2017   CO2 22 06/28/2017   Lab Results  Component Value Date   ALT 12 (L) 02/01/2016   AST 18 02/01/2016   ALKPHOS 124 02/01/2016   BILITOT 0.5 02/01/2016   Lab Results  Component Value Date   CHOL 174 09/24/2015   HDL 48 09/24/2015   LDLCALC 105 (H) 09/24/2015   TRIG 107 09/24/2015   CHOLHDL 3.6 09/24/2015    Lab Results  Component Value Date   HGBA1C 8.8 (H) 09/23/2015   No results found for: TSH   STUDIES/PROCEDURES reviewed today   Echo 09/2018 1. The left ventricle has normal systolic function with an ejection  fraction of 60-65%. The cavity size was normal. Left ventricular diastolic  Doppler parameters are consistent with pseudonormalization.  2. The right ventricle has normal systolic function. The cavity was  normal. There is no increase in right ventricular wall thickness. Right  ventricular systolic pressure is moderately elevated with an estimated  pressure of 54.6 mmHg.  3. Tricuspid  valve regurgitation is moderate.   Assessment & Plan    CAD --Stable. No anginal sx revealed but unable to get pt to clarify her dyspnea and if better, worse, or the same from her previous visit. She remains sedentary but does not wish to increase activity due to her back pain. Discussed need to monitor her murmur as below. She declines. She reports she wants to refill her medications only.  Continue GDMT with ASA, BB, statin.  Diastolic dysfunction, pulmonary hypertension -Unclear if LEE is increased from previous visits per pt report.  She reports she is taking lasix PRN and did not take this AM but will when she gets home. Continue Lasix 20 mg as needed.  Given her CKD 4, encouraged her to establish with nephrology.  As previously noted, future considerations could include RHC for better understanding of hemodynamics, heart pressures, and valvular dz at this time.  Valvular heart disease -Echo 09/2018 with LVEF 60 to 65%, DD, moderate TR, moderate MR. She reports overall asymptomatic though unclear if DOE has increased. Encouraged an updated echo, declined by patient.  Stressed the importance of continuing periodic monitoring of her murmur. She prefers to obtain medication refills only.  Continue optimization of BP and fluid volume.  Lower extremity edema -Continue as needed Lasix 20 mg daily. Denies that she was prescribed to take lasix daily. Continue elevation of lower extremities.  Discussed compression stockings.  Discussed low-sodium diet and fluid restriction.  CKD4 -Caution with changes in diuretics and antihypertensive agents.  Hypertension -BP today well controlled.  Continue current medications.  Hyperlipidemia -Continue current statin.  DM 2 -Per PCP.  Obesity -Lifestyle changes.  Medication changes: None Labs ordered: None per pt Studies / Imaging ordered: None per pt Disposition: Follow-up in 6 months with Dr. Melody Haver, PA-C 02/24/2020

## 2020-02-24 NOTE — Patient Instructions (Signed)
Medication Instructions:  - Your physician recommends that you continue on your current medications as directed. Please refer to the Current Medication list given to you today.  *If you need a refill on your cardiac medications before your next appointment, please call your pharmacy*   Lab Work: - none ordered  If you have labs (blood work) drawn today and your tests are completely normal, you will receive your results only by: Marland Kitchen MyChart Message (if you have MyChart) OR . A paper copy in the mail If you have any lab test that is abnormal or we need to change your treatment, we will call you to review the results.   Testing/Procedures: - none ordered   Follow-Up: At [facility], you and your health needs are our priority.  As part of our continuing mission to provide you with exceptional heart care, we have created designated Provider Care Teams.  These Care Teams include your primary Cardiologist (physician) and Advanced Practice Providers (APPs -  Physician Assistants and Nurse Practitioners) who all work together to provide you with the care you need, when you need it.  We recommend signing up for the patient portal called "MyChart".  Sign up information is provided on this After Visit Summary.  MyChart is used to connect with patients for Virtual Visits (Telemedicine).  Patients are able to view lab/test results, encounter notes, upcoming appointments, etc.  Non-urgent messages can be sent to your provider as well.   To learn more about what you can do with MyChart, go to [email or web address].    Your next appointment:   6 month(s)  The format for your next appointment:   In Person  Provider:    You may see Ida Rogue, MD or one of the following Advanced Practice Providers on your designated Care Team:    Murray Hodgkins, NP  Christell Faith, PA-C  Marrianne Mood, PA-C    Other Instructions - n/a

## 2020-04-27 ENCOUNTER — Other Ambulatory Visit: Payer: Self-pay | Admitting: Family

## 2020-04-27 DIAGNOSIS — E785 Hyperlipidemia, unspecified: Secondary | ICD-10-CM

## 2020-05-16 ENCOUNTER — Other Ambulatory Visit: Payer: Self-pay | Admitting: Family

## 2020-05-16 DIAGNOSIS — I2510 Atherosclerotic heart disease of native coronary artery without angina pectoris: Secondary | ICD-10-CM

## 2020-05-18 ENCOUNTER — Other Ambulatory Visit: Payer: Self-pay | Admitting: Internal Medicine

## 2020-05-18 DIAGNOSIS — Z1231 Encounter for screening mammogram for malignant neoplasm of breast: Secondary | ICD-10-CM

## 2020-06-11 ENCOUNTER — Other Ambulatory Visit: Payer: Self-pay

## 2020-06-11 ENCOUNTER — Emergency Department: Payer: Medicare Other

## 2020-06-11 DIAGNOSIS — Z7982 Long term (current) use of aspirin: Secondary | ICD-10-CM | POA: Diagnosis not present

## 2020-06-11 DIAGNOSIS — I129 Hypertensive chronic kidney disease with stage 1 through stage 4 chronic kidney disease, or unspecified chronic kidney disease: Secondary | ICD-10-CM | POA: Insufficient documentation

## 2020-06-11 DIAGNOSIS — Z7984 Long term (current) use of oral hypoglycemic drugs: Secondary | ICD-10-CM | POA: Insufficient documentation

## 2020-06-11 DIAGNOSIS — Z79899 Other long term (current) drug therapy: Secondary | ICD-10-CM | POA: Insufficient documentation

## 2020-06-11 DIAGNOSIS — Z96649 Presence of unspecified artificial hip joint: Secondary | ICD-10-CM | POA: Insufficient documentation

## 2020-06-11 DIAGNOSIS — I2510 Atherosclerotic heart disease of native coronary artery without angina pectoris: Secondary | ICD-10-CM | POA: Diagnosis not present

## 2020-06-11 DIAGNOSIS — E1122 Type 2 diabetes mellitus with diabetic chronic kidney disease: Secondary | ICD-10-CM | POA: Diagnosis not present

## 2020-06-11 DIAGNOSIS — R079 Chest pain, unspecified: Secondary | ICD-10-CM | POA: Diagnosis present

## 2020-06-11 DIAGNOSIS — N183 Chronic kidney disease, stage 3 unspecified: Secondary | ICD-10-CM | POA: Insufficient documentation

## 2020-06-11 DIAGNOSIS — Z794 Long term (current) use of insulin: Secondary | ICD-10-CM | POA: Diagnosis not present

## 2020-06-11 LAB — BASIC METABOLIC PANEL
Anion gap: 11 (ref 5–15)
BUN: 21 mg/dL (ref 8–23)
CO2: 25 mmol/L (ref 22–32)
Calcium: 9.2 mg/dL (ref 8.9–10.3)
Chloride: 104 mmol/L (ref 98–111)
Creatinine, Ser: 1.79 mg/dL — ABNORMAL HIGH (ref 0.44–1.00)
GFR, Estimated: 28 mL/min — ABNORMAL LOW (ref >60)
Glucose, Bld: 142 mg/dL — ABNORMAL HIGH (ref 70–99)
Potassium: 4.7 mmol/L (ref 3.5–5.1)
Sodium: 140 mmol/L (ref 135–145)

## 2020-06-11 LAB — CBC
HCT: 37 % (ref 36.0–46.0)
Hemoglobin: 11.1 g/dL — ABNORMAL LOW (ref 12.0–15.0)
MCH: 30 pg (ref 26.0–34.0)
MCHC: 30 g/dL (ref 30.0–36.0)
MCV: 100 fL (ref 80.0–100.0)
Platelets: 296 10*3/uL (ref 150–400)
RBC: 3.7 MIL/uL — ABNORMAL LOW (ref 3.87–5.11)
RDW: 15.2 % (ref 11.5–15.5)
WBC: 8.4 10*3/uL (ref 4.0–10.5)
nRBC: 0 % (ref 0.0–0.2)

## 2020-06-11 LAB — TROPONIN I (HIGH SENSITIVITY): Troponin I (High Sensitivity): 12 ng/L (ref <18)

## 2020-06-11 NOTE — ED Triage Notes (Addendum)
PT to ED via POV c/o intermittent sharp L sided CP starting today. SHOB when walking around. No specific aggravator to the CP. HX of stents.

## 2020-06-12 ENCOUNTER — Emergency Department
Admission: EM | Admit: 2020-06-12 | Discharge: 2020-06-12 | Disposition: A | Discharge status: Home / Self Care | Payer: Medicare Other | Attending: Emergency Medicine | Admitting: Emergency Medicine

## 2020-06-12 DIAGNOSIS — R079 Chest pain, unspecified: Secondary | ICD-10-CM

## 2020-06-12 LAB — TROPONIN I (HIGH SENSITIVITY): Troponin I (High Sensitivity): 13 ng/L (ref <18)

## 2020-06-12 NOTE — ED Provider Notes (Signed)
[facility] Emergency Department Provider Note  Time seen: 6:26 AM  I have reviewed the triage vital signs and the nursing notes.   HISTORY  Chief Complaint Chest Pain   HPI Melissa Christian is a 79 y.o. female with a past medical history of anemia, CAD status post stent in 2017, diabetes, CKD, hypertension, presents to the emergency department for chest pain.  According to the patient approximately an hour or so before coming to the emergency department she developed left-sided chest pain.  States it was intermittent, sharp mild to moderate in severity.  Patient denies any nausea vomiting diaphoresis or shortness of breath.  No recent fever or increased cough.  Denies any abdominal pain.  Largely negative review of systems.  Patient denies any pain or symptoms at this time.   Past Medical History:  Diagnosis Date  . Anemia   . CAD (coronary artery disease)    a. NSTEMI 09/2015: cardiac cath: LM no obs dz, mLAD 95% s/p PCI/DES 0%, LCx no obs, RCA no obs, LVEF 55-65%  . Chronic kidney disease (CKD), stage III (moderate) (HCC)   . Diabetes mellitus with complication ([place])   . HLD (hyperlipidemia)   . Hypertension   . Low blood potassium   . NSTEMI (non-ST elevated myocardial infarction) ([place])   . PONV (postoperative nausea and vomiting)   . Valvular heart disease    a. 09/2015: EF 55-60%, challenging images unable to exclude mild mid-distal anterior to apical HK, nl LV dias fxn, mild AI/MR, normal size of left atrium, RV systolic function normal, PASP normal      Patient Active Problem List   Diagnosis Date Noted  . Complex renal cyst 05/23/2016  . Encounter for medication management 05/09/2016  . Morbid obesity due to excess calories ([place])   . Coronary artery disease involving native coronary artery of native heart with unstable angina pectoris ([place])   . Pain in the chest   . S/P cardiac cath   . Chest pain 09/22/2015  . Elevated troponin 09/22/2015  . Type  2 diabetes mellitus ([place]) 09/22/2015  . HTN (hypertension) 09/22/2015  . HLD (hyperlipidemia) 09/22/2015    Past Surgical History:  Procedure Laterality Date  . BACK SURGERY    . CARDIAC CATHETERIZATION N/A 09/23/2015   Procedure: Right and Left Heart Cath;  Surgeon: Minna Merritts, MD;  Location: [place] [facility];  Service: Cardiovascular;  Laterality: N/A;  . CARDIAC CATHETERIZATION N/A 09/23/2015   Procedure: Coronary Stent Intervention;  Surgeon: Yolonda Kida, MD;  Location: [place] [facility];  Service: Cardiovascular;  Laterality: N/A;  . CATARACT EXTRACTION W/PHACO Left 04/26/2017   Procedure: CATARACT EXTRACTION PHACO AND INTRAOCULAR LENS PLACEMENT ([place]) LEFT DIABETIC;  Surgeon: Leandrew Koyanagi, MD;  Location: [place];  Service: Ophthalmology;  Laterality: Left;  diabetic-oral meds  . CATARACT EXTRACTION W/PHACO Right 05/31/2017   Procedure: CATARACT EXTRACTION PHACO AND INTRAOCULAR LENS PLACEMENT ([place]) RIGHT DIABETIC;  Surgeon: Leandrew Koyanagi, MD;  Location: [place];  Service: Ophthalmology;  Laterality: Right;  . CHOLECYSTECTOMY N/A 06/16/2016   Procedure: LAPAROSCOPIC CHOLECYSTECTOMY WITH INTRAOPERATIVE CHOLANGIOGRAM;  Surgeon: Leonie Green, MD;  Location: [facility];  Service: General;  Laterality: N/A;  . CORONARY STENT PLACEMENT    . JOINT REPLACEMENT    . LUMBAR FUSION    . TOTAL HIP ARTHROPLASTY      Prior to Admission medications   Medication Sig Start Date End Date Taking? Authorizing Provider  aspirin EC 81  MG tablet Take 81 mg by mouth daily.    [provider]  B Complex-C-Folic Acid (RENA-VITE RX) 1 MG TABS Take 1 tablet by mouth daily. 08/17/18   [provider]  Cholecalciferol (VITAMIN D3) 2000 units TABS Take 2,000 Units by mouth.    [provider]  diltiazem (CARDIZEM CD) 240 MG 24 hr capsule Take 2 capsules (480 mg total) by mouth daily. 08/21/19   Loel Dubonnet, NP  ferrous  sulfate 325 (65 FE) MG tablet Take 325 mg by mouth 2 (two) times daily. 05/30/18   [provider]  furosemide (LASIX) 40 MG tablet Take 20 mg by mouth daily.     [provider]  glipiZIDE (GLUCOTROL) 10 MG tablet Take 20 mg by mouth 2 (two) times daily before a meal.    [provider]  metoprolol succinate (TOPROL-XL) 100 MG 24 hr tablet TAKE 1 TABLET (100 MG TOTAL) BY MOUTH DAILY. TAKE WITH OR IMMEDIATELY FOLLOWING A MEAL. 05/19/20   Minna Merritts, MD  pioglitazone (ACTOS) 30 MG tablet Take 30 mg by mouth daily.  05/07/18   [provider]  potassium chloride (K-DUR,KLOR-CON) 10 MEQ tablet Take 10 mEq by mouth as needed (when taking Lasix).     [provider]  rosuvastatin (CRESTOR) 40 MG tablet TAKE 1 TABLET BY MOUTH DAILY. PLEASE CALL TO SCHEDULE OFFICE VISIT FOR FURTHER REFILLS. THANK YOU! 04/27/20   Loel Dubonnet, NP  triamterene-hydrochlorothiazide (DYAZIDE) 37.5-25 MG capsule Take 1 capsule by mouth every other day.    [provider]  TRULICITY 1.5 FX/9.0WI SOPN INJECT 1.5 MG SUBCUTANEOUSLY EVERY 7 (SEVEN) DAYS 07/02/18   [provider]    Allergies  Allergen Reactions  . Ferrous Sulfate Itching  . Keflex [Cephalexin] Itching  . Percocet [Oxycodone-Acetaminophen] Itching  . Vicodin [Hydrocodone-Acetaminophen] Itching  . Ace Inhibitors Rash    elevated potassium at higher doses  . Penicillins Rash    Has patient had a PCN reaction causing immediate rash, facial/tongue/throat swelling, SOB or lightheadedness with hypotension: No Has patient had a PCN reaction causing severe rash involving mucus membranes or skin necrosis: No Has patient had a PCN reaction that required hospitalization No Has patient had a PCN reaction occurring within the last 10 years: No If all of the above answers are "NO", then may proceed with Cephalosporin use.     Family History  Problem Relation Age of Onset  . Heart disease Father    . Diabetes Mother   . Hypertension Mother     Social History Social History   Tobacco Use  . Smoking status: Never Smoker  . Smokeless tobacco: Never Used  Vaping Use  . Vaping Use: Never used  Substance Use Topics  . Alcohol use: No    Alcohol/week: 0.0 standard drinks  . Drug use: No    Review of Systems Constitutional: Negative for fever. Cardiovascular: Chest pain last night, now resolved Respiratory: Negative for shortness of breath. Gastrointestinal: Negative for abdominal pain, vomiting Musculoskeletal: Negative for musculoskeletal complaints Neurological: Negative for headache All other ROS negative  ____________________________________________   PHYSICAL EXAM:  VITAL SIGNS: ED Triage Vitals  Enc Vitals Group     BP 06/11/20 2211 (!) 170/57     Pulse Rate 06/11/20 2211 68     Resp 06/11/20 2211 (!) 22     Temp 06/11/20 2211 97.7 F (36.5 C)     Temp Source 06/11/20 2211 Oral     SpO2 06/11/20 2211  95 %     Weight 06/11/20 2212 270 lb (122.5 kg)     Height 06/11/20 2212 5\' 4"  (1.626 m)     Head Circumference --      Peak Flow --      Pain Score 06/11/20 2212 0     Pain Loc --      Pain Edu? --      Excl. in [place]? --    Constitutional: Alert and oriented. Well appearing and in no distress. Eyes: Normal exam ENT      Head: Normocephalic and atraumatic.      Mouth/Throat: Mucous membranes are moist. Cardiovascular: Normal rate, regular rhythm. No murmur Respiratory: Normal respiratory effort without tachypnea nor retractions. Breath sounds are clear Gastrointestinal: Soft and nontender. No distention.  T Musculoskeletal: Nontender with normal range of motion in all extremities.  1+ lower extreme edema bilaterally. Neurologic:  Normal speech and language. No gross focal neurologic deficits  Skin:  Skin is warm, dry and intact.  Psychiatric: Mood and affect are normal.  ____________________________________________    EKG  EKG viewed and  interpreted by myself shows a normal sinus rhythm at 70 bpm with a widened QRS, left axis deviation, largely normal intervals, nonspecific ST changes.  ____________________________________________    RADIOLOGY  Chest x-ray is negative  ____________________________________________   INITIAL IMPRESSION / ASSESSMENT AND PLAN / ED COURSE  Pertinent labs & imaging results that were available during my care of the patient were reviewed by me and considered in my medical decision making (see chart for details).   Patient presents to the emergency department for chest pain last night now resolved.  Overall the patient appears well, reassuring lab work including negative troponin x2.  X-ray and EKG showed no significant or acute findings.  Patient follows up with Dr. Rockey Situ for cardiology.  Given the patient's reassuring work-up in the emergency department as well as reassuring physical exam I believe the patient is safe for discharge home with outpatient follow-up with Dr. Rockey Situ.  Patient is to call him today to arrange a follow-up appointment.  I discussed my typical chest pain return precautions.  Patient agreeable to plan of care.  Melissa Christian was evaluated in Emergency Department on 06/12/2020 for the symptoms described in the history of present illness. She was evaluated in the context of the global COVID-19 pandemic, which necessitated consideration that the patient might be at risk for infection with the SARS-CoV-2 virus that causes COVID-19. Institutional protocols and algorithms that pertain to the evaluation of patients at risk for COVID-19 are in a state of rapid change based on information released by regulatory bodies including the CDC and federal and state organizations. These policies and algorithms were followed during the patient's care in the ED.  ____________________________________________   FINAL CLINICAL IMPRESSION(S) / ED DIAGNOSES  Chest pain   Harvest Dark,  MD 06/12/20 [phone]

## 2020-06-12 NOTE — ED Notes (Signed)
Pt sitting in lobby with no distress noted; updated on wait time and vs retaken; no c/o voiced at present

## 2020-06-18 ENCOUNTER — Other Ambulatory Visit: Payer: Self-pay

## 2020-06-18 ENCOUNTER — Ambulatory Visit: Payer: Medicare Other | Admitting: Physician Assistant

## 2020-06-18 ENCOUNTER — Encounter: Payer: Self-pay | Admitting: Physician Assistant

## 2020-06-18 VITALS — BP 130/62 | HR 70 | Ht 64.0 in | Wt 281.0 lb

## 2020-06-18 DIAGNOSIS — M7989 Other specified soft tissue disorders: Secondary | ICD-10-CM

## 2020-06-18 DIAGNOSIS — I5189 Other ill-defined heart diseases: Secondary | ICD-10-CM | POA: Diagnosis not present

## 2020-06-18 DIAGNOSIS — I10 Essential (primary) hypertension: Secondary | ICD-10-CM

## 2020-06-18 DIAGNOSIS — N183 Chronic kidney disease, stage 3 unspecified: Secondary | ICD-10-CM

## 2020-06-18 DIAGNOSIS — I351 Nonrheumatic aortic (valve) insufficiency: Secondary | ICD-10-CM

## 2020-06-18 DIAGNOSIS — I272 Pulmonary hypertension, unspecified: Secondary | ICD-10-CM

## 2020-06-18 DIAGNOSIS — I2510 Atherosclerotic heart disease of native coronary artery without angina pectoris: Secondary | ICD-10-CM

## 2020-06-18 DIAGNOSIS — E785 Hyperlipidemia, unspecified: Secondary | ICD-10-CM

## 2020-06-18 DIAGNOSIS — I340 Nonrheumatic mitral (valve) insufficiency: Secondary | ICD-10-CM

## 2020-06-18 NOTE — Progress Notes (Signed)
Office Visit    Patient Name: Melissa Christian Date of Encounter: 06/18/2020  Primary Care Provider:  Rusty Aus, MD Primary Cardiologist:  Melissa Rogue, MD  Chief Complaint    Chief Complaint  Patient presents with  . Follow-up    ED F/U-chest pain. Patient states her symptoms have not recurred since ED visit.     79 year old female with history of CAD with NSTEMI 09/2015 with PCI/DES to the mid LAD, CKD3, DM2, hypertension, hyperlipidemia, AI, MR, and who presents today for ED follow-up of CP.  Past Medical History    Past Medical History:  Diagnosis Date  . Anemia   . CAD (coronary artery disease)    a. NSTEMI 09/2015: cardiac cath: LM no obs dz, mLAD 95% s/p PCI/DES 0%, LCx no obs, RCA no obs, LVEF 55-65%  . Chronic kidney disease (CKD), stage III (moderate) (HCC)   . Diabetes mellitus with complication (Melissa Christian)   . HLD (hyperlipidemia)   . Hypertension   . Low blood potassium   . NSTEMI (non-ST elevated myocardial infarction) ([place])   . PONV (postoperative nausea and vomiting)   . Valvular heart disease    a. 09/2015: EF 55-60%, challenging images unable to exclude mild mid-distal anterior to apical HK, nl LV dias fxn, mild AI/MR, normal size of left atrium, RV systolic function normal, PASP normal     Past Surgical History:  Procedure Laterality Date  . BACK SURGERY    . CARDIAC CATHETERIZATION N/A 09/23/2015   Procedure: Right and Left Heart Cath;  Surgeon: Melissa Merritts, MD;  Location: [place] [facility];  Service: Cardiovascular;  Laterality: N/A;  . CARDIAC CATHETERIZATION N/A 09/23/2015   Procedure: Coronary Stent Intervention;  Surgeon: Melissa Kida, MD;  Location: [place] [facility];  Service: Cardiovascular;  Laterality: N/A;  . CATARACT EXTRACTION W/PHACO Left 04/26/2017   Procedure: CATARACT EXTRACTION PHACO AND INTRAOCULAR LENS PLACEMENT (Melissa Christian) LEFT DIABETIC;  Surgeon: Melissa Koyanagi, MD;  Location: [place];  Service:  Ophthalmology;  Laterality: Left;  diabetic-oral meds  . CATARACT EXTRACTION W/PHACO Right 05/31/2017   Procedure: CATARACT EXTRACTION PHACO AND INTRAOCULAR LENS PLACEMENT (Melissa Christian) RIGHT DIABETIC;  Surgeon: Melissa Koyanagi, MD;  Location: [place];  Service: Ophthalmology;  Laterality: Right;  . CHOLECYSTECTOMY N/A 06/16/2016   Procedure: LAPAROSCOPIC CHOLECYSTECTOMY WITH INTRAOPERATIVE CHOLANGIOGRAM;  Surgeon: Melissa Green, MD;  Location: [facility];  Service: General;  Laterality: N/A;  . CORONARY STENT PLACEMENT    . JOINT REPLACEMENT    . LUMBAR FUSION    . TOTAL HIP ARTHROPLASTY      Allergies  Allergies  Allergen Reactions  . Ferrous Sulfate Itching  . Keflex [Cephalexin] Itching  . Percocet [Oxycodone-Acetaminophen] Itching  . Vicodin [Hydrocodone-Acetaminophen] Itching  . Ace Inhibitors Rash    elevated potassium at higher doses  . Penicillins Rash    Has patient had a PCN reaction causing immediate rash, facial/tongue/throat swelling, SOB or lightheadedness with hypotension: No Has patient had a PCN reaction causing severe rash involving mucus membranes or skin necrosis: No Has patient had a PCN reaction that required hospitalization No Has patient had a PCN reaction occurring within the last 10 years: No If all of the above answers are "NO", then may proceed with Cephalosporin use.     History of Present Illness    [name] is a 79 y.o. female with PMH as above.    She was admitted 09/2015 with non-ST elevation myocardial infarction.  LHC 09/23/2015 with LM showing no significant disease, mLAD 95% s/p PCI/DES, LCx and RCA without significant disease.    Echo 09/23/2015 with EF 55 to 60%.  It was noted challenging imaging quality thus unable to exclude mild mid to distal anterior wall and apical hypokinesis.  Mild AI/MR.    She was admitted 06/2016 with cholelithiasis and underwent successful laparoscopic cholecystectomy.    Office visit  06/19/2017 with symptomatic hypotension, losartan and HCTZ stopped, BMET with worsening renal function.    Echo 07/03/2017 EF 60 to 65%, no RWMA, G1DD, mild AI, mild MR, normal RVSP, PASP normal.  Lexiscan Myoview 07/18/2017 for atypical chest discomfort ruled low risk.  Repeat echo 09/2018 for monitoring of valvular heart disease with LVEF 60 to 65%, DD, normal RVSF, RVSP  54.33mmHg, moderate TR, moderate MR.  She was last evaluated 08/21/2019 by Melissa Montana, NP.  At that time, she felt well from a cardiac standpoint.  She did note some dyspnea though at her baseline.  She was not as active during the pandemic.  Her Lasix had recently been changed from 20 mg as needed to 20 mg daily.  With this change, she noted improved lower extremity edema.  On 02/24/2020, she presented for a refill of her Crestor.  She reported sleeping in a recliner but stated this is not new for her.  She had lower extremity edema on exam, noting this is her baseline.  She also reported that she had not yet taken her Lasix, which could be the reason for her edema.  She was taking her as needed Lasix on a daily basis.  She had dyspnea, attributed to deconditioning.  She reported back pain limited her activity.  She was relatively sedentary without desire for PT/OT.  Today, 06/18/2020, she returns to clinic and notes chest pain this past Thursday, 06/11/2020.  Chest pain was described as a sharp pain that occurred under her left breast and occurred approximately 2 hours after eating dinner.  This chest pain reportedly stopped before she reached the emergency room.  She states that it was a different CP than that before her previous MI.  She denies any racing heart rate, palpitations, presyncope, or syncope.  She reports stable dyspnea, attributed to deconditioning.  She reports ongoing sedentary lifestyle, stating that her main form of activity is walking to and from the bathroom.  She reports stable LEE and that she is still sleeping in a  recliner. She drinks 1 cup of coffee, 2 bottles of water (16/17 ounce).  She has not yet taken her Lasix today and reports that she is not due for her triamterene-HCTZ until tomorrow 12/17.  She is monitoring her BP at home with SBP 130s to 140s. On review of medications, she denies having ever taken potassium, though it is listed on her medication list.  She denies any signs or symptoms of bleeding.  She reports her chest pain from 12/9 has not recurred since she presented to the ED.  Home Medications    Prior to Admission medications   Medication Sig Start Date End Date Taking? Authorizing Provider  aspirin EC 81 MG tablet Take 81 mg by mouth daily.   Yes [provider]  B Complex-C-Folic Acid (RENA-VITE RX) 1 MG TABS Take 1 tablet by mouth daily. 08/17/18  Yes [provider]  Cholecalciferol (VITAMIN D3) 2000 units TABS Take 2,000 Units by mouth.   Yes [provider]  diltiazem (CARDIZEM CD) 240 MG 24 hr capsule Take 2  capsules (480 mg total) by mouth daily. 08/21/19  Yes Loel Dubonnet, NP  ferrous sulfate 325 (65 FE) MG tablet Take 325 mg by mouth 2 (two) times daily. 05/30/18  Yes [provider]  furosemide (LASIX) 40 MG tablet Take 20 mg by mouth daily.    Yes [provider]  glipiZIDE (GLUCOTROL) 10 MG tablet Take 20 mg by mouth 2 (two) times daily before a meal.   Yes [provider]  metoprolol succinate (TOPROL-XL) 100 MG 24 hr tablet Take 1 tablet (100 mg total) by mouth daily. Take with or immediately following a meal. 08/21/19  Yes Loel Dubonnet, NP  pioglitazone (ACTOS) 30 MG tablet Take 30 mg by mouth daily.  05/07/18  Yes [provider]  potassium chloride (K-DUR,KLOR-CON) 10 MEQ tablet Take 10 mEq by mouth as needed (when taking Lasix).    Yes [provider]  rosuvastatin (CRESTOR) 40 MG tablet TAKE 1 TABLET (40 MG TOTAL) BY MOUTH DAILY. PLEASE CALL TO SCHEDULE OFFICE VISIT FOR FURTHER REFILLS. THANK  YOU! 02/03/20  Yes Loel Dubonnet, NP  triamterene-hydrochlorothiazide (DYAZIDE) 37.5-25 MG capsule Take 1 capsule by mouth every other day.   Yes [provider]  TRULICITY 1.5 UM/3.5TI SOPN INJECT 1.5 MG SUBCUTANEOUSLY EVERY 7 (SEVEN) DAYS 07/02/18  Yes [provider]    Review of Systems    She reports sharp chest pain that occurred under her left breast approximately 2 hours after eating and has not recurred since that time.  This chest pain was reported as different from that of her previous cardiac events.  She denies orthopnea but is still  sleeping in a recliner. She reports she is sedentary due to back pain.  She denies any increase from baseline edema. She has not taken her lasix or triamterene-HCTZ. She denies palpitations, pnd, n, v, dizziness, syncope, weight gain, or early satiety.  She continues to note dyspnea on exertion.   All other systems reviewed and are otherwise negative except as noted above.  Physical Exam    VS:  BP 130/62 (BP Location: Right Arm, Patient Position: Sitting, Cuff Size: Large)   Pulse 70   Ht 5\' 4"  (1.626 m)   Wt 281 lb (127.5 kg)   SpO2 94%   BMI 48.23 kg/m  , BMI Body mass index is 48.23 kg/m. GEN: Obese female, NAD. HEENT: normal. Neck: Supple, JVD difficult to assess due to body habitus, carotid bruits, or masses. Cardiac: RRR, 2/6 systolic murmur. No rubs, or gallops. No clubbing, cyanosis. Bilateral moderate edema.  Radials/DP/PT 2+ and equal bilaterally.  Respiratory:  Distant breath sounds. Respirations regular and unlabored, clear to auscultation bilaterally. GI: Soft, nontender, nondistended, BS + x 4. MS: no deformity or atrophy. Skin: warm and dry, no rash. Neuro:  Strength and sensation are intact. Psych: Normal affect.  Accessory Clinical Findings    ECG personally reviewed by me today - SR with 1st degree AVB, 70bpm, LAD, LAFB and RBBB (trifascicular block)  - no acute changes.  VITALS Reviewed today    Temp Readings from Last 3 Encounters:  06/12/20 97.7 F (36.5 C) (Oral)  05/31/17 (!) 97.3 F (36.3 C)  04/26/17 98.1 F (36.7 C)   BP Readings from Last 3 Encounters:  06/18/20 130/62  06/12/20 (!) 144/54  02/24/20 118/80   Pulse Readings from Last 3 Encounters:  06/18/20 70  06/12/20 73  02/24/20 78    Wt Readings from Last 3 Encounters:  06/18/20 281 lb (127.5  kg)  06/11/20 270 lb (122.5 kg)  02/24/20 273 lb 8 oz (124.1 kg)     LABS  reviewed today   Lab Results  Component Value Date   WBC 8.4 06/11/2020   HGB 11.1 (L) 06/11/2020   HCT 37.0 06/11/2020   MCV 100.0 06/11/2020   PLT 296 06/11/2020   Lab Results  Component Value Date   CREATININE 1.79 (H) 06/11/2020   BUN 21 06/11/2020   NA 140 06/11/2020   K 4.7 06/11/2020   CL 104 06/11/2020   CO2 25 06/11/2020   Lab Results  Component Value Date   ALT 12 (L) 02/01/2016   AST 18 02/01/2016   ALKPHOS 124 02/01/2016   BILITOT 0.5 02/01/2016   Lab Results  Component Value Date   CHOL 174 09/24/2015   HDL 48 09/24/2015   LDLCALC 105 (H) 09/24/2015   TRIG 107 09/24/2015   CHOLHDL 3.6 09/24/2015    Lab Results  Component Value Date   HGBA1C 8.8 (H) 09/23/2015   No results found for: TSH   STUDIES/PROCEDURES reviewed today   Echo 09/2018 1. The left ventricle has normal systolic function with an ejection  fraction of 60-65%. The cavity size was normal. Left ventricular diastolic  Doppler parameters are consistent with pseudonormalization.  2. The right ventricle has normal systolic function. The cavity was  normal. There is no increase in right ventricular wall thickness. Right  ventricular systolic pressure is moderately elevated with an estimated  pressure of 54.6 mmHg.  3. Tricuspid valve regurgitation is moderate.   Assessment & Plan    CAD --Stable. No CP and DOE unchanged. She remains sedentary but agreeable to purchase exercise bands to try and increase exercise tolerance.  Discussed need to monitor her murmur as below. Continue GDMT with ASA, BB, statin.  Diastolic dysfunction, pulmonary hypertension -Unclear if LEE is increased, given she has not yet taken her lasix today and not due to take her triamterene-HCTZ until tomorrow. Continue Lasix 20 mg daily.  Given her CKD 4, continue to encourage her to establish with nephrology.  As previously noted, future considerations could include RHC for better understanding of hemodynamics, heart pressures, and valvular dz at this time.  Valvular heart disease -Echo 09/2018 with LVEF 60 to 65%, DD, moderate TR, moderate MR. She reports overall asymptomatic although does still have DOE. Continue to encourage update of echo and monitoring of her murmur.  Continue optimization of BP and fluid volume.  Lower extremity edema -Continue Lasix 20 mg daily. Continue elevation of lower extremities.  Discussed compression stockings.  Discussed low-sodium diet and fluid restriction.  CKD4 -Caution with changes in diuretics and antihypertensive agents.  Hypertension -BP elevated with pt report this is 2/2 not taking her medications.  Continue current medications.  Hyperlipidemia -Continue current statin.  DM 2 -Per PCP.  Obesity -Lifestyle changes.   Medication changes: None Labs ordered: None per pt Studies / Imaging ordered: Defered Disposition: Follow-up as previously scheduled     Arvil Chaco, PA-C 06/18/2020

## 2020-06-18 NOTE — Patient Instructions (Signed)
Medication Instructions:  - Your physician recommends that you continue on your current medications as directed. Please refer to the Current Medication list given to you today.  *If you need a refill on your cardiac medications before your next appointment, please call your pharmacy*   Lab Work: - none ordered  If you have labs (blood work) drawn today and your tests are completely normal, you will receive your results only by:  [place] (if you have MyChart) OR  A paper copy in the mail If you have any lab test that is abnormal or we need to change your treatment, we will call you to review the results.   Testing/Procedures: - none ordered   Follow-Up: At [facility], you and your health needs are our priority.  As part of our continuing mission to provide you with exceptional heart care, we have created designated Provider Care Teams.  These Care Teams include your primary Cardiologist (physician) and Advanced Practice Providers (APPs -  Physician Assistants and Nurse Practitioners) who all work together to provide you with the care you need, when you need it.  We recommend signing up for the patient portal called "MyChart".  Sign up information is provided on this After Visit Summary.  MyChart is used to connect with patients for Virtual Visits (Telemedicine).  Patients are able to view lab/test results, encounter notes, upcoming appointments, etc.  Non-urgent messages can be sent to your provider as well.   To learn more about what you can do with MyChart, go to [email or web address].    Your next appointment:   As scheduled  The format for your next appointment:   In Person  Provider:   Ida Rogue, MD   Other Instructions - You will need to take all of your regular morning medications the day of your next appointment, prior to coming in

## 2020-06-24 ENCOUNTER — Ambulatory Visit
Admission: RE | Admit: 2020-06-24 | Discharge: 2020-06-24 | Disposition: A | Discharge status: Home / Self Care | Payer: Medicare Other | Source: Ambulatory Visit | Attending: Internal Medicine | Admitting: Internal Medicine

## 2020-06-24 ENCOUNTER — Other Ambulatory Visit: Payer: Self-pay

## 2020-06-24 DIAGNOSIS — Z1231 Encounter for screening mammogram for malignant neoplasm of breast: Secondary | ICD-10-CM | POA: Insufficient documentation

## 2020-07-24 ENCOUNTER — Other Ambulatory Visit: Payer: Self-pay | Admitting: Family

## 2020-07-24 DIAGNOSIS — E785 Hyperlipidemia, unspecified: Secondary | ICD-10-CM

## 2020-08-14 DIAGNOSIS — E1122 Type 2 diabetes mellitus with diabetic chronic kidney disease: Secondary | ICD-10-CM | POA: Diagnosis not present

## 2020-08-14 DIAGNOSIS — N184 Chronic kidney disease, stage 4 (severe): Secondary | ICD-10-CM | POA: Diagnosis not present

## 2020-08-20 DIAGNOSIS — I509 Heart failure, unspecified: Secondary | ICD-10-CM | POA: Diagnosis not present

## 2020-08-20 DIAGNOSIS — Z0000 Encounter for general adult medical examination without abnormal findings: Secondary | ICD-10-CM | POA: Diagnosis not present

## 2020-08-20 DIAGNOSIS — Z23 Encounter for immunization: Secondary | ICD-10-CM | POA: Diagnosis not present

## 2020-08-20 DIAGNOSIS — N184 Chronic kidney disease, stage 4 (severe): Secondary | ICD-10-CM | POA: Diagnosis not present

## 2020-08-20 DIAGNOSIS — I130 Hypertensive heart and chronic kidney disease with heart failure and stage 1 through stage 4 chronic kidney disease, or unspecified chronic kidney disease: Secondary | ICD-10-CM | POA: Diagnosis not present

## 2020-08-20 DIAGNOSIS — E1122 Type 2 diabetes mellitus with diabetic chronic kidney disease: Secondary | ICD-10-CM | POA: Diagnosis not present

## 2020-08-30 NOTE — Progress Notes (Signed)
Cardiology Office Note  Date:  08/31/2020   ID:  Melissa Christian, Melissa Christian [birth year], MRN [id number]  PCP:  Rusty Aus, MD   Chief Complaint  Patient presents with  . Follow-up    6 Months follow up. Medications verbally reviewed with patient.     HPI:   Melissa Christian is a 80 y.o. female with hx of CAD. NSTEMI 09/2015: cardiac cath: mLAD 95% s/p PCI/DES 0%, LCx no obs, RCA no obs, LVEF 55-65%,Xience stent  lap chole 06/2016 hypertension,  EF >55% on echo 09/23/15 Hyperlipidemia Mild AI Who presents for follow up of her CAD, S/p PCI  LOV with myself 01/2017 Last seen by provider 06/2020  Echo 07/03/2017 EF 60 to 65%, no RWMA, G1DD, mild AI, mild MR, normal RVSP, PASP normal.    Lexiscan Myoview 07/18/2017 for atypical chest discomfort ruled low risk.  Repeat echo 09/2018 for monitoring of valvular heart disease with LVEF 60 to 65%, DD, normal RVSF, RVSP  54.58mmHg, moderate TR, moderate MR.  Labs reviewed: CR 1.7, stable HGBA1C 5.8 Total chol 126, LDL 53  No chest pain, no sob Dr. Sabra Heck cut back on diabetes meds  No dizziness,  No falls Legs weak Uses wheelchair to get into office Prior surgery Back, Hip   PMH:   has a past medical history of Anemia, CAD (coronary artery disease), Chronic kidney disease (CKD), stage III (moderate) ([place]), Diabetes mellitus with complication ([place]), HLD (hyperlipidemia), Hypertension, Low blood potassium, NSTEMI (non-ST elevated myocardial infarction) ([place]), PONV (postoperative nausea and vomiting), and Valvular heart disease.  PSH:    Past Surgical History:  Procedure Laterality Date  . BACK SURGERY    . CARDIAC CATHETERIZATION N/A 09/23/2015   Procedure: Right and Left Heart Cath;  Surgeon: Minna Merritts, MD;  Location: [facility];  Service: Cardiovascular;  Laterality: N/A;  . CARDIAC CATHETERIZATION N/A 09/23/2015   Procedure: Coronary Stent Intervention;  Surgeon: Yolonda Kida, MD;  Location: [facility];   Service: Cardiovascular;  Laterality: N/A;  . CATARACT EXTRACTION W/PHACO Left 04/26/2017   Procedure: CATARACT EXTRACTION PHACO AND INTRAOCULAR LENS PLACEMENT ([place]) LEFT DIABETIC;  Surgeon: Leandrew Koyanagi, MD;  Location: [facility];  Service: Ophthalmology;  Laterality: Left;  diabetic-oral meds  . CATARACT EXTRACTION W/PHACO Right 05/31/2017   Procedure: CATARACT EXTRACTION PHACO AND INTRAOCULAR LENS PLACEMENT ([place]) RIGHT DIABETIC;  Surgeon: Leandrew Koyanagi, MD;  Location: [facility];  Service: Ophthalmology;  Laterality: Right;  . CHOLECYSTECTOMY N/A 06/16/2016   Procedure: LAPAROSCOPIC CHOLECYSTECTOMY WITH INTRAOPERATIVE CHOLANGIOGRAM;  Surgeon: Leonie Green, MD;  Location: [facility];  Service: General;  Laterality: N/A;  . CORONARY STENT PLACEMENT    . JOINT REPLACEMENT    . LUMBAR FUSION    . TOTAL HIP ARTHROPLASTY      Current Outpatient Medications  Medication Sig Dispense Refill  . aspirin EC 81 MG tablet Take 81 mg by mouth daily.    . B Complex-C-Folic Acid (RENA-VITE RX) 1 MG TABS Take 1 tablet by mouth daily.    . Cholecalciferol (VITAMIN D3) 2000 units TABS Take 2,000 Units by mouth.    . diltiazem (CARDIZEM CD) 240 MG 24 hr capsule Take 2 capsules (480 mg total) by mouth daily. 90 capsule 1  . ferrous sulfate 325 (65 FE) MG tablet Take 325 mg by mouth 2 (two) times daily.    . furosemide (LASIX) 20 MG tablet Take 20 mg by mouth as needed.    Marland Kitchen glipiZIDE (  GLUCOTROL) 10 MG tablet Take 20 mg by mouth daily before breakfast.    . metoprolol succinate (TOPROL-XL) 100 MG 24 hr tablet TAKE 1 TABLET (100 MG TOTAL) BY MOUTH DAILY. TAKE WITH OR IMMEDIATELY FOLLOWING A MEAL. 90 tablet 1  . pioglitazone (ACTOS) 30 MG tablet Take 30 mg by mouth daily.     . rosuvastatin (CRESTOR) 40 MG tablet Take 1 tablet (40 mg total) by mouth daily. 90 tablet 0  . triamterene-hydrochlorothiazide (DYAZIDE) 37.5-25 MG capsule Take 1 capsule by mouth every other day.     . TRULICITY 1.5 KZ/6.0FU SOPN INJECT 1.5 MG SUBCUTANEOUSLY EVERY 7 (SEVEN) DAYS     No current facility-administered medications for this visit.    Allergies:   Ferrous sulfate, Keflex [cephalexin], Percocet [oxycodone-acetaminophen], Vicodin [hydrocodone-acetaminophen], Ace inhibitors, and Penicillins   Social History:  The patient  reports that she has never smoked. She has never used smokeless tobacco. She reports that she does not drink alcohol and does not use drugs.   Family History:   family history includes Diabetes in her mother; Heart disease in her father; Hypertension in her mother.    Review of Systems: Review of Systems  Constitutional: Positive for malaise/fatigue.  HENT: Negative.   Respiratory: Negative.   Cardiovascular: Negative.   Gastrointestinal: Negative.   Musculoskeletal: Negative.   Neurological: Negative.   Psychiatric/Behavioral: Negative.   All other systems reviewed and are negative.   PHYSICAL EXAM: VS:  BP (!) 130/58 (BP Location: Right Arm, Patient Position: Sitting, Cuff Size: Large)   Pulse 70   Ht 5\' 4"  (1.626 m)   Wt 283 lb (128.4 kg)   SpO2 93%   BMI 48.58 kg/m  , BMI Body mass index is 48.58 kg/m. Constitutional:  oriented to person, place, and time. No distress.  HENT:  Head: Grossly normal Eyes:  no discharge. No scleral icterus.  Neck: No JVD, no carotid bruits  Cardiovascular: Regular rate and rhythm, no murmurs appreciated Pulmonary/Chest: Clear to auscultation bilaterally, no wheezes or rails Abdominal: Soft.  no distension.  no tenderness.  Musculoskeletal: Normal range of motion Neurological:  normal muscle tone. Coordination normal. No atrophy Skin: Skin warm and dry Psychiatric: normal affect, pleasant   Recent Labs: 06/11/2020: BUN 21; Creatinine, Ser 1.79; Hemoglobin 11.1; Platelets 296; Potassium 4.7; Sodium 140    Lipid Panel Lab Results  Component Value Date   CHOL 174 09/24/2015   HDL 48 09/24/2015    LDLCALC 105 (H) 09/24/2015   TRIG 107 09/24/2015      Wt Readings from Last 3 Encounters:  08/31/20 283 lb (128.4 kg)  06/18/20 281 lb (127.5 kg)  06/11/20 270 lb (122.5 kg)      ASSESSMENT AND PLAN:  Coronary artery disease involving native coronary artery of native heart with unstable angina pectoris ([place]) - Plan: EKG 12-Lead Currently with no symptoms of angina. No further workup at this time. Continue current medication regimen.  Mixed hyperlipidemia - Plan: EKG 12-Lead Cholesterol is at goal on the current lipid regimen. No changes to the medications were made.  Essential hypertension - Plan: EKG 12-Lead Blood pressure is well controlled on today's visit. No changes made to the medications. On very high dose diltiazem, talked about coming down secondary to leg swelling, She does not want to change at this time. Legs swollen "from fat"  Type 2 diabetes mellitus with other circulatory complication, without long-term current use of insulin ([place]) -  We have encouraged continued exercise, careful diet management in  an effort to lose weight.  Morbid obesity due to excess calories ([place]) - Plan: EKG 12-Lead We have encouraged continued exercise, careful diet management in an effort to lose weight.    Total encounter time more than 25 minutes  Greater than 50% was spent in counseling and coordination of care with the patient   No orders of the defined types were placed in this encounter.    Signed, Esmond Plants, M.D., Ph.D. 08/31/2020  [place], [place]

## 2020-08-31 ENCOUNTER — Other Ambulatory Visit: Payer: Self-pay

## 2020-08-31 ENCOUNTER — Ambulatory Visit: Payer: Medicare HMO | Admitting: Cardiovascular Disease

## 2020-08-31 ENCOUNTER — Encounter: Payer: Self-pay | Admitting: Cardiovascular Disease

## 2020-08-31 VITALS — BP 130/58 | HR 70 | Ht 64.0 in | Wt 283.0 lb

## 2020-08-31 DIAGNOSIS — I25118 Atherosclerotic heart disease of native coronary artery with other forms of angina pectoris: Secondary | ICD-10-CM | POA: Diagnosis not present

## 2020-08-31 DIAGNOSIS — M7989 Other specified soft tissue disorders: Secondary | ICD-10-CM

## 2020-08-31 DIAGNOSIS — N183 Chronic kidney disease, stage 3 unspecified: Secondary | ICD-10-CM | POA: Diagnosis not present

## 2020-08-31 DIAGNOSIS — I351 Nonrheumatic aortic (valve) insufficiency: Secondary | ICD-10-CM

## 2020-08-31 DIAGNOSIS — I272 Pulmonary hypertension, unspecified: Secondary | ICD-10-CM | POA: Diagnosis not present

## 2020-08-31 DIAGNOSIS — E785 Hyperlipidemia, unspecified: Secondary | ICD-10-CM

## 2020-08-31 DIAGNOSIS — I10 Essential (primary) hypertension: Secondary | ICD-10-CM

## 2020-08-31 NOTE — Patient Instructions (Signed)
Medication Instructions:  No changes  If you need a refill on your cardiac medications before your next appointment, please call your pharmacy.    Lab work: No new labs needed   If you have labs (blood work) drawn today and your tests are completely normal, you will receive your results only by: . MyChart Message (if you have MyChart) OR . A paper copy in the mail If you have any lab test that is abnormal or we need to change your treatment, we will call you to review the results.   Testing/Procedures: No new testing needed   Follow-Up: At [facility], you and your health needs are our priority.  As part of our continuing mission to provide you with exceptional heart care, we have created designated Provider Care Teams.  These Care Teams include your primary Cardiologist (physician) and Advanced Practice Providers (APPs -  Physician Assistants and Nurse Practitioners) who all work together to provide you with the care you need, when you need it.  . You will need a follow up appointment in 12 months  . Providers on your designated Care Team:   . Christopher Berge, NP . Ryan Dunn, PA-C . Jacquelyn Visser, PA-C  Any Other Special Instructions Will Be Listed Below (If Applicable).  COVID-19 Vaccine Information can be found at: [email or web address] For questions related to vaccine distribution or appointments, please email [email or web address] or call [phone].     

## 2020-09-15 DIAGNOSIS — E113393 Type 2 diabetes mellitus with moderate nonproliferative diabetic retinopathy without macular edema, bilateral: Secondary | ICD-10-CM | POA: Diagnosis not present

## 2021-02-15 DIAGNOSIS — E1122 Type 2 diabetes mellitus with diabetic chronic kidney disease: Secondary | ICD-10-CM | POA: Diagnosis not present

## 2021-02-15 DIAGNOSIS — N184 Chronic kidney disease, stage 4 (severe): Secondary | ICD-10-CM | POA: Diagnosis not present

## 2021-02-22 DIAGNOSIS — N184 Chronic kidney disease, stage 4 (severe): Secondary | ICD-10-CM | POA: Diagnosis not present

## 2021-02-22 DIAGNOSIS — Z6841 Body Mass Index (BMI) 40.0 and over, adult: Secondary | ICD-10-CM | POA: Diagnosis not present

## 2021-02-22 DIAGNOSIS — E1122 Type 2 diabetes mellitus with diabetic chronic kidney disease: Secondary | ICD-10-CM | POA: Diagnosis not present

## 2021-03-26 DIAGNOSIS — E113313 Type 2 diabetes mellitus with moderate nonproliferative diabetic retinopathy with macular edema, bilateral: Secondary | ICD-10-CM | POA: Diagnosis not present

## 2021-05-18 ENCOUNTER — Other Ambulatory Visit: Payer: Self-pay | Admitting: Internal Medicine

## 2021-05-18 DIAGNOSIS — Z1231 Encounter for screening mammogram for malignant neoplasm of breast: Secondary | ICD-10-CM

## 2021-08-10 ENCOUNTER — Other Ambulatory Visit: Payer: Self-pay

## 2021-08-10 ENCOUNTER — Ambulatory Visit
Admission: RE | Admit: 2021-08-10 | Discharge: 2021-08-10 | Disposition: A | Discharge status: Home / Self Care | Payer: Medicare HMO | Source: Ambulatory Visit | Attending: Internal Medicine | Admitting: Internal Medicine

## 2021-08-10 DIAGNOSIS — Z1231 Encounter for screening mammogram for malignant neoplasm of breast: Secondary | ICD-10-CM | POA: Diagnosis not present

## 2021-08-24 DIAGNOSIS — E1122 Type 2 diabetes mellitus with diabetic chronic kidney disease: Secondary | ICD-10-CM | POA: Diagnosis not present

## 2021-08-24 DIAGNOSIS — E538 Deficiency of other specified B group vitamins: Secondary | ICD-10-CM | POA: Diagnosis not present

## 2021-08-24 DIAGNOSIS — N184 Chronic kidney disease, stage 4 (severe): Secondary | ICD-10-CM | POA: Diagnosis not present

## 2021-08-31 DIAGNOSIS — Z0000 Encounter for general adult medical examination without abnormal findings: Secondary | ICD-10-CM | POA: Diagnosis not present

## 2021-08-31 DIAGNOSIS — E538 Deficiency of other specified B group vitamins: Secondary | ICD-10-CM | POA: Diagnosis not present

## 2021-08-31 DIAGNOSIS — Z1389 Encounter for screening for other disorder: Secondary | ICD-10-CM | POA: Diagnosis not present

## 2021-08-31 DIAGNOSIS — I509 Heart failure, unspecified: Secondary | ICD-10-CM | POA: Diagnosis not present

## 2021-08-31 DIAGNOSIS — Z6841 Body Mass Index (BMI) 40.0 and over, adult: Secondary | ICD-10-CM | POA: Diagnosis not present

## 2021-08-31 DIAGNOSIS — N184 Chronic kidney disease, stage 4 (severe): Secondary | ICD-10-CM | POA: Diagnosis not present

## 2021-08-31 DIAGNOSIS — E1122 Type 2 diabetes mellitus with diabetic chronic kidney disease: Secondary | ICD-10-CM | POA: Diagnosis not present

## 2021-08-31 DIAGNOSIS — I129 Hypertensive chronic kidney disease with stage 1 through stage 4 chronic kidney disease, or unspecified chronic kidney disease: Secondary | ICD-10-CM | POA: Diagnosis not present

## 2021-08-31 DIAGNOSIS — I110 Hypertensive heart disease with heart failure: Secondary | ICD-10-CM | POA: Diagnosis not present

## 2021-08-31 DIAGNOSIS — I2510 Atherosclerotic heart disease of native coronary artery without angina pectoris: Secondary | ICD-10-CM | POA: Diagnosis not present

## 2021-09-24 DIAGNOSIS — E113313 Type 2 diabetes mellitus with moderate nonproliferative diabetic retinopathy with macular edema, bilateral: Secondary | ICD-10-CM | POA: Diagnosis not present

## 2022-02-03 NOTE — Progress Notes (Signed)
Cardiology Office Note  Date:  02/04/2022   ID:  Melissa Christian 09-04-1940, MRN [id number]  PCP:  Melissa Aus, MD   Chief Complaint  Patient presents with   6 month follow up     Patient c/o shortness of breath, frequent urination & LE edema. Medications reviewed by the patient verbally.     HPI:  Melissa Christian is a 81 y.o. female with hx of CAD NSTEMI 09/2015: cardiac cath: mLAD 95% s/p PCI/DES 0%, LCx no obs, RCA no obs, LVEF 55-65%,Xience stent  lap chole 06/2016 hypertension,  EF >55% on echo 09/23/15 Hyperlipidemia Mild AI Pulmonary hypertension Who presents for follow up of her CAD, S/p PCI, pulmonary hypertension, diastolic CHF  Last seen by myself in clinic February 2022 Reports have worsening leg swelling, now extending up to her thighs She called Dr. Ammie Christian office was told to take Lasix 40 for several days then back down to 20 Despite this swelling has persisted High fluid intake, also eating restaurant food, people bring in  She is relatively immobile, presenting today in a wheelchair, difficulty walking Very sedentary at home  Reports having issues with incontinence Scheduled to see Dr. Sabra Christian in several weeks time for lab work  EKG personally reviewed by myself on todays visit Normal sinus rhythm rate 68 bpm right bundle branch block, PVCs, left anterior fascicular block, poor R wave progression through the anterior precordial leads  Prior records reviewed Echo 07/03/2017 EF 60 to 65%, no RWMA, G1DD, mild AI, mild MR, normal RVSP, PASP normal.    Lexiscan Myoview 07/18/2017 for atypical chest discomfort ruled low risk.  Repeat echo 09/2018 for monitoring of valvular heart disease with LVEF 60 to 65%, DD, normal RVSF, RVSP  54.75mHg, moderate TR, moderate MR.  Echocardiogram March 2020 Normal ejection fraction estimated 60%, moderately elevated right heart pressures At that time HCTZ was changed to Lasix 20 daily  Labs reviewed: CR 1.6,  stable HGBA1C 5.8 Total chol 140, LDL 61 HGB 11.8   PMH:   has a past medical history of Anemia, CAD (coronary artery disease), Chronic kidney disease (CKD), stage III (moderate) ([place], Diabetes mellitus with complication ([place], HLD (hyperlipidemia), Hypertension, Low blood potassium, NSTEMI (non-ST elevated myocardial infarction) ([place], PONV (postoperative nausea and vomiting), and Valvular heart disease.  PSH:    Past Surgical History:  Procedure Laterality Date   BACK SURGERY     CARDIAC CATHETERIZATION N/A 09/23/2015   Procedure: Right and Left Heart Cath;  Surgeon: Melissa Merritts MD;  [facility];  Service: Cardiovascular;  Laterality: N/A;   CARDIAC CATHETERIZATION N/A 09/23/2015   Procedure: Coronary Stent Intervention;  Surgeon: DYolonda Kida MD;  [facility];  Service: Cardiovascular;  Laterality: N/A;   CATARACT EXTRACTION W/PHACO Left 04/26/2017   Procedure: CATARACT EXTRACTION PHACO AND INTRAOCULAR LENS PLACEMENT ([place] LEFT DIABETIC;  Surgeon: BLeandrew Koyanagi MD;  [place]  Service: Ophthalmology;  Laterality: Left;  diabetic-oral meds   CATARACT EXTRACTION W/PHACO Right 05/31/2017   Procedure: CATARACT EXTRACTION PHACO AND INTRAOCULAR LENS PLACEMENT ([place] RIGHT DIABETIC;  Surgeon: BLeandrew Koyanagi MD;  [place]  Service: Ophthalmology;  Laterality: Right;   CHOLECYSTECTOMY N/A 06/16/2016   Procedure: LAPAROSCOPIC CHOLECYSTECTOMY WITH INTRAOPERATIVE CHOLANGIOGRAM;  Surgeon: JLeonie Green MD;  Location: [facility];  Service: General;  Laterality: N/A;   CORONARY STENT PLACEMENT     JOINT REPLACEMENT     LUMBAR FUSION  TOTAL HIP ARTHROPLASTY      Current Outpatient Medications  Medication Sig Dispense Refill   aspirin EC 81 MG tablet Take 81 mg by mouth daily.     Melissa Complex-C-Folic Acid (RENA-VITE RX) 1 MG TABS Take 1 tablet by mouth daily.     Cholecalciferol (VITAMIN  D3) 2000 units TABS Take 2,000 Units by mouth.     cyanocobalamin (VITAMIN B12) 1000 MCG/ML injection Inject 1,000 mcg into the muscle every 14 (fourteen) days.     diltiazem (CARDIZEM CD) 240 MG 24 hr capsule Take 2 capsules (480 mg total) by mouth daily. 90 capsule 1   furosemide (LASIX) 20 MG tablet Take 20 mg by mouth daily.     metoprolol succinate (TOPROL-XL) 100 MG 24 hr tablet TAKE 1 TABLET (100 MG TOTAL) BY MOUTH DAILY. TAKE WITH OR IMMEDIATELY FOLLOWING A MEAL. 90 tablet 1   pioglitazone (ACTOS) 30 MG tablet Take 30 mg by mouth daily.      rosuvastatin (CRESTOR) 40 MG tablet Take 1 tablet (40 mg total) by mouth daily. 90 tablet 0   triamterene-hydrochlorothiazide (DYAZIDE) 37.5-25 MG capsule Take 1 capsule by mouth every other day.     TRULICITY 1.5 IW/9.7LG SOPN INJECT 1.5 MG SUBCUTANEOUSLY EVERY 7 (SEVEN) DAYS     ferrous sulfate 325 (65 FE) MG tablet Take 325 mg by mouth 2 (two) times daily. (Patient not taking: Reported on 02/04/2022)     glipiZIDE (GLUCOTROL) 10 MG tablet Take 20 mg by mouth daily before breakfast. (Patient not taking: Reported on 02/04/2022)     No current facility-administered medications for this visit.    Allergies:   Ferrous sulfate, Keflex [cephalexin], Percocet [oxycodone-acetaminophen], Vicodin [hydrocodone-acetaminophen], Ace inhibitors, and Penicillins   Social History:  The patient  reports that she has never smoked. She has never used smokeless tobacco. She reports that she does not drink alcohol and does not use drugs.   Family History:   family history includes Diabetes in her mother; Heart disease in her father; Hypertension in her mother.    Review of Systems: Review of Systems  Constitutional: Negative.   HENT: Negative.    Respiratory:  Positive for shortness of breath.   Cardiovascular:  Positive for leg swelling.  Gastrointestinal: Negative.   Musculoskeletal: Negative.   Neurological: Negative.   Psychiatric/Behavioral: Negative.    All  other systems reviewed and are negative.   PHYSICAL EXAM: VS:  BP (!) 110/54 (BP Location: Left Arm, Patient Position: Sitting, Cuff Size: Large)   Pulse 68   Ht '5\' 4"'$  (1.626 m)   Wt 280 lb 8 oz (127.2 kg)   SpO2 (!) 88%   BMI 48.15 kg/m  , BMI Body mass index is 48.15 kg/m. Constitutional:  oriented to person, place, and time. No distress.  Obese, presenting in a wheelchair HENT:  Head: Grossly normal Eyes:  no discharge. No scleral icterus.  Neck: No JVD, no carotid bruits  Cardiovascular: Regular rate and rhythm, no murmurs appreciated 1+ pitting lower extremity edema Pulmonary/Chest: Clear to auscultation bilaterally, no wheezes or rales Abdominal: Soft.  no distension.  no tenderness.  Musculoskeletal: Normal range of motion Neurological:  normal muscle tone. Coordination normal. No atrophy Skin: Skin warm and dry Psychiatric: normal affect, pleasant   Recent Labs: No results found for requested labs within last 365 days.    Lipid Panel Lab Results  Component Value Date   CHOL 174 09/24/2015   HDL 48 09/24/2015   LDLCALC 105 (H) 09/24/2015  TRIG 107 09/24/2015      Wt Readings from Last 3 Encounters:  02/04/22 280 lb 8 oz (127.2 kg)  08/31/20 283 lb (128.4 kg)  06/18/20 281 lb (127.5 kg)      ASSESSMENT AND PLAN:  Coronary artery disease involving native coronary artery of native heart with unstable angina pectoris ([place]) - Plan: EKG 12-Lead Currently with no symptoms of angina. No further workup at this time. Continue current medication regimen.  Mixed hyperlipidemia - Plan: EKG 12-Lead Cholesterol slightly above goal, recommended diet for weight loss, unable to exercise.  In follow-up could add Zetia  Essential hypertension - Plan: EKG 12-Lead She previously declined changing diltiazem but she is more receptive today as legs are more swollen Recommend she stop the diltiazem Change to metoprolol succinate 100 in the morning 50 in the evening, start  Cardura 2 mg twice daily, closely monitor blood pressure and call us with numbers Increase Lasix as below  Type 2 diabetes mellitus with other circulatory complication, without long-term current use of insulin (HCC) -  A1c 6.5, high end of her range Low carbohydrate diet recommended  Morbid obesity due to excess calories ([place]) - Plan: EKG 12-Lead We have encouraged careful diet management in an effort to lose weight.  Chronic diastolic CHF with pulmonary HTN Exacerbated by morbid obesity, Very sedentary, high fluid intake, some restaurant food Difficulty losing weight, up to 80 relatively immobile, sedentary Recommend she increase Lasix up to 40 daily Has lab work with Dr. Sabra Christian in several weeks time May need to change to torsemide if no improvement with Lasix 40 off diltiazem    Total encounter time more than 40 minutes  Greater than 50% was spent in counseling and coordination of care with the patient   No orders of the defined types were placed in this encounter.    Signed, Esmond Plants, M.Melissa., Ph.Melissa. 02/04/2022  [place], [place]

## 2022-02-04 ENCOUNTER — Ambulatory Visit: Payer: Medicare HMO | Admitting: Cardiovascular Disease

## 2022-02-04 ENCOUNTER — Encounter: Payer: Self-pay | Admitting: Cardiovascular Disease

## 2022-02-04 VITALS — BP 110/54 | HR 68 | Ht 64.0 in | Wt 280.5 lb

## 2022-02-04 DIAGNOSIS — M7989 Other specified soft tissue disorders: Secondary | ICD-10-CM | POA: Diagnosis not present

## 2022-02-04 DIAGNOSIS — I272 Pulmonary hypertension, unspecified: Secondary | ICD-10-CM

## 2022-02-04 DIAGNOSIS — I2510 Atherosclerotic heart disease of native coronary artery without angina pectoris: Secondary | ICD-10-CM

## 2022-02-04 DIAGNOSIS — I10 Essential (primary) hypertension: Secondary | ICD-10-CM | POA: Diagnosis not present

## 2022-02-04 DIAGNOSIS — I25118 Atherosclerotic heart disease of native coronary artery with other forms of angina pectoris: Secondary | ICD-10-CM | POA: Diagnosis not present

## 2022-02-04 DIAGNOSIS — I351 Nonrheumatic aortic (valve) insufficiency: Secondary | ICD-10-CM | POA: Diagnosis not present

## 2022-02-04 DIAGNOSIS — N183 Chronic kidney disease, stage 3 unspecified: Secondary | ICD-10-CM | POA: Diagnosis not present

## 2022-02-04 DIAGNOSIS — E785 Hyperlipidemia, unspecified: Secondary | ICD-10-CM | POA: Diagnosis not present

## 2022-02-04 MED ORDER — DOXAZOSIN MESYLATE 2 MG PO TABS: 2.0000 mg | ORAL_TABLET | Freq: Two times a day (BID) | ORAL | 3 refills | Status: DC

## 2022-02-04 MED ORDER — METOPROLOL SUCCINATE ER 100 MG PO TB24: ORAL_TABLET | ORAL | 3 refills | Status: DC

## 2022-02-04 MED ORDER — FUROSEMIDE 40 MG PO TABS: 40.0000 mg | ORAL_TABLET | Freq: Every day | ORAL | 3 refills | Status: DC

## 2022-02-04 MED ORDER — ROSUVASTATIN CALCIUM 40 MG PO TABS: 40.0000 mg | ORAL_TABLET | Freq: Every day | ORAL | 3 refills | Status: AC

## 2022-02-04 MED ORDER — TRIAMTERENE-HCTZ 37.5-25 MG PO CAPS: 1.0000 | ORAL_CAPSULE | ORAL | 3 refills | Status: DC

## 2022-02-04 NOTE — Patient Instructions (Addendum)
Medication Instructions:  Please increase the lasix up to 40 mg daily  Stop the diltiazem Increase the metoprolol up to 1 in the Am and 1/2 in the PM  Start cardura/doxazosin 2 mg twice a day   If you need a refill on your cardiac medications before your next appointment, please call your pharmacy.   Lab work: No new labs needed  Testing/Procedures: No new testing needed  Follow-Up: At [facility], you and your health needs are our priority.  As part of our continuing mission to provide you with exceptional heart care, we have created designated Provider Care Teams.  These Care Teams include your primary Cardiologist (physician) and Advanced Practice Providers (APPs -  Physician Assistants and Nurse Practitioners) who all work together to provide you with the care you need, when you need it.  You will need a follow up appointment in 3 months with APP  Providers on your designated Care Team:   Murray Hodgkins, NP Christell Faith, PA-C Cadence Kathlen Mody, [place]  COVID-19 Vaccine Information can be found at: [email or web address].uk For questions related to vaccine distribution or appointments, please email [email or web address] or call [phone].

## 2022-02-22 DIAGNOSIS — E1122 Type 2 diabetes mellitus with diabetic chronic kidney disease: Secondary | ICD-10-CM | POA: Diagnosis not present

## 2022-02-22 DIAGNOSIS — E538 Deficiency of other specified B group vitamins: Secondary | ICD-10-CM | POA: Diagnosis not present

## 2022-02-22 DIAGNOSIS — N184 Chronic kidney disease, stage 4 (severe): Secondary | ICD-10-CM | POA: Diagnosis not present

## 2022-02-23 ENCOUNTER — Ambulatory Visit: Payer: Medicare Other | Admitting: Urology

## 2022-03-01 DIAGNOSIS — E1122 Type 2 diabetes mellitus with diabetic chronic kidney disease: Secondary | ICD-10-CM | POA: Diagnosis not present

## 2022-03-01 DIAGNOSIS — I5030 Unspecified diastolic (congestive) heart failure: Secondary | ICD-10-CM | POA: Diagnosis not present

## 2022-03-01 DIAGNOSIS — D509 Iron deficiency anemia, unspecified: Secondary | ICD-10-CM | POA: Diagnosis not present

## 2022-03-01 DIAGNOSIS — N184 Chronic kidney disease, stage 4 (severe): Secondary | ICD-10-CM | POA: Diagnosis not present

## 2022-03-29 DIAGNOSIS — H35371 Puckering of macula, right eye: Secondary | ICD-10-CM | POA: Diagnosis not present

## 2022-03-29 DIAGNOSIS — E113393 Type 2 diabetes mellitus with moderate nonproliferative diabetic retinopathy without macular edema, bilateral: Secondary | ICD-10-CM | POA: Diagnosis not present

## 2022-03-31 DIAGNOSIS — D500 Iron deficiency anemia secondary to blood loss (chronic): Secondary | ICD-10-CM | POA: Diagnosis not present

## 2022-04-06 DIAGNOSIS — I517 Cardiomegaly: Secondary | ICD-10-CM | POA: Diagnosis not present

## 2022-04-06 DIAGNOSIS — R1084 Generalized abdominal pain: Secondary | ICD-10-CM | POA: Diagnosis not present

## 2022-04-06 DIAGNOSIS — Z23 Encounter for immunization: Secondary | ICD-10-CM | POA: Diagnosis not present

## 2022-04-06 DIAGNOSIS — D509 Iron deficiency anemia, unspecified: Secondary | ICD-10-CM | POA: Diagnosis not present

## 2022-04-06 DIAGNOSIS — R0600 Dyspnea, unspecified: Secondary | ICD-10-CM | POA: Diagnosis not present

## 2022-04-06 DIAGNOSIS — I2510 Atherosclerotic heart disease of native coronary artery without angina pectoris: Secondary | ICD-10-CM | POA: Diagnosis not present

## 2022-04-06 DIAGNOSIS — R634 Abnormal weight loss: Secondary | ICD-10-CM | POA: Diagnosis not present

## 2022-04-06 DIAGNOSIS — R918 Other nonspecific abnormal finding of lung field: Secondary | ICD-10-CM | POA: Diagnosis not present

## 2022-04-08 ENCOUNTER — Other Ambulatory Visit: Payer: Self-pay | Admitting: Internal Medicine

## 2022-04-08 DIAGNOSIS — R634 Abnormal weight loss: Secondary | ICD-10-CM

## 2022-04-08 DIAGNOSIS — D500 Iron deficiency anemia secondary to blood loss (chronic): Secondary | ICD-10-CM

## 2022-04-08 DIAGNOSIS — R1084 Generalized abdominal pain: Secondary | ICD-10-CM

## 2022-04-11 ENCOUNTER — Encounter: Payer: Self-pay | Admitting: Oncology

## 2022-04-11 ENCOUNTER — Inpatient Hospital Stay: Payer: Medicare HMO

## 2022-04-11 ENCOUNTER — Inpatient Hospital Stay: Payer: Medicare HMO | Attending: Oncology | Admitting: Oncology

## 2022-04-11 VITALS — BP 125/45 | HR 63 | Temp 97.8°F | Resp 18 | Wt 267.0 lb

## 2022-04-11 DIAGNOSIS — D508 Other iron deficiency anemias: Secondary | ICD-10-CM

## 2022-04-11 DIAGNOSIS — N183 Chronic kidney disease, stage 3 unspecified: Secondary | ICD-10-CM | POA: Diagnosis not present

## 2022-04-11 DIAGNOSIS — Z7982 Long term (current) use of aspirin: Secondary | ICD-10-CM | POA: Diagnosis not present

## 2022-04-11 DIAGNOSIS — D631 Anemia in chronic kidney disease: Secondary | ICD-10-CM

## 2022-04-11 DIAGNOSIS — D649 Anemia, unspecified: Secondary | ICD-10-CM | POA: Insufficient documentation

## 2022-04-11 DIAGNOSIS — D509 Iron deficiency anemia, unspecified: Secondary | ICD-10-CM | POA: Insufficient documentation

## 2022-04-11 LAB — CBC WITH DIFFERENTIAL/PLATELET
Abs Immature Granulocytes: 0.03 10*3/uL (ref 0.00–0.07)
Basophils Absolute: 0.1 10*3/uL (ref 0.0–0.1)
Basophils Relative: 1 %
Eosinophils Absolute: 0.1 10*3/uL (ref 0.0–0.5)
Eosinophils Relative: 1 %
HCT: 28.9 % — ABNORMAL LOW (ref 36.0–46.0)
Hemoglobin: 8.5 g/dL — ABNORMAL LOW (ref 12.0–15.0)
Immature Granulocytes: 0 %
Lymphocytes Relative: 8 %
Lymphs Abs: 0.5 10*3/uL — ABNORMAL LOW (ref 0.7–4.0)
MCH: 27.6 pg (ref 26.0–34.0)
MCHC: 29.4 g/dL — ABNORMAL LOW (ref 30.0–36.0)
MCV: 93.8 fL (ref 80.0–100.0)
Monocytes Absolute: 0.5 10*3/uL (ref 0.1–1.0)
Monocytes Relative: 7 %
Neutro Abs: 5.8 10*3/uL (ref 1.7–7.7)
Neutrophils Relative %: 83 %
Platelets: 231 10*3/uL (ref 150–400)
RBC: 3.08 MIL/uL — ABNORMAL LOW (ref 3.87–5.11)
RDW: 22.1 % — ABNORMAL HIGH (ref 11.5–15.5)
WBC: 7 10*3/uL (ref 4.0–10.5)
nRBC: 0 % (ref 0.0–0.2)

## 2022-04-11 LAB — COMPREHENSIVE METABOLIC PANEL
ALT: 12 U/L (ref 0–44)
AST: 24 U/L (ref 15–41)
Albumin: 3.6 g/dL (ref 3.5–5.0)
Alkaline Phosphatase: 66 U/L (ref 38–126)
Anion gap: 8 (ref 5–15)
BUN: 38 mg/dL — ABNORMAL HIGH (ref 8–23)
CO2: 27 mmol/L (ref 22–32)
Calcium: 9 mg/dL (ref 8.9–10.3)
Chloride: 106 mmol/L (ref 98–111)
Creatinine, Ser: 1.7 mg/dL — ABNORMAL HIGH (ref 0.44–1.00)
GFR, Estimated: 30 mL/min — ABNORMAL LOW (ref >60)
Glucose, Bld: 113 mg/dL — ABNORMAL HIGH (ref 70–99)
Potassium: 3.4 mmol/L — ABNORMAL LOW (ref 3.5–5.1)
Sodium: 141 mmol/L (ref 135–145)
Total Bilirubin: 0.6 mg/dL (ref 0.3–1.2)
Total Protein: 7.3 g/dL (ref 6.5–8.1)

## 2022-04-11 LAB — IRON AND TIBC
Iron: 42 ug/dL (ref 28–170)
Saturation Ratios: 10 % — ABNORMAL LOW (ref 10.4–31.8)
TIBC: 417 ug/dL (ref 250–450)
UIBC: 375 ug/dL

## 2022-04-11 LAB — FERRITIN: Ferritin: 11 ng/mL (ref 11–307)

## 2022-04-11 LAB — RETIC PANEL
Immature Retic Fract: 21.7 % — ABNORMAL HIGH (ref 2.3–15.9)
RBC.: 3.08 MIL/uL — ABNORMAL LOW (ref 3.87–5.11)
Retic Count, Absolute: 136.4 10*3/uL (ref 19.0–186.0)
Retic Ct Pct: 4.4 % — ABNORMAL HIGH (ref 0.4–3.1)
Reticulocyte Hemoglobin: 26.7 pg — ABNORMAL LOW (ref >27.9)

## 2022-04-11 LAB — LACTATE DEHYDROGENASE: LDH: 195 U/L — ABNORMAL HIGH (ref 98–192)

## 2022-04-11 NOTE — Assessment & Plan Note (Signed)
Labs reviewed and discussed with patient. severe iron deficiency anemia. Suspect chronic blood loss from GI tract.  Patient declines GI work-up. I discussed about the potential risks including but not limited to allergic reactions/infusion reactions including anaphylactic reactions, phlebitis, high blood pressure, wheezing, SOB, skin rash, weight gain, leg swelling, headache, nausea and fatigue, etc. patient agrees with IV Venofer treatments.  Plan IV venofer weekly x 5

## 2022-04-11 NOTE — Progress Notes (Signed)
Hematology/Oncology Consult note [phone] [phone]      Patient Care Team: Rusty Aus, MD as PCP - General (Internal Medicine) Minna Merritts, MD as PCP - Cardiology (Cardiology) Minna Merritts, MD as Consulting Physician (Cardiology)   REFERRING PROVIDER: Rusty Aus, MD  CHIEF COMPLAINTS/REASON FOR VISIT:  Anemia  ASSESSMENT & PLAN:  IDA (iron deficiency anemia) Labs reviewed and discussed with patient. severe iron deficiency anemia. Suspect chronic blood loss from GI tract.  Patient declines GI work-up. I discussed about the potential risks including but not limited to allergic reactions/infusion reactions including anaphylactic reactions, phlebitis, high blood pressure, wheezing, SOB, skin rash, weight gain, leg swelling, headache, nausea and fatigue, etc. patient agrees with IV Venofer treatments.  Plan IV venofer weekly x 5    Anemia in stage 3 chronic kidney disease (HCC) IV Venofer to increase iron stores. Follow-up in the future.  If hemoglobin is persistently less than 10, consider erythropoietin replacement therapy.  Orders Placed This Encounter  Procedures   Iron and TIBC    Standing Status:   Future    Number of Occurrences:   1    Standing Expiration Date:   04/12/2023   CBC with Differential/Platelet    Standing Status:   Future    Number of Occurrences:   1    Standing Expiration Date:   04/12/2023   Lactate dehydrogenase    Standing Status:   Future    Number of Occurrences:   1    Standing Expiration Date:   04/12/2023   Kappa/lambda light chains    Standing Status:   Future    Number of Occurrences:   1    Standing Expiration Date:   04/12/2023   Multiple Myeloma Panel (SPEP&IFE w/QIG)    Standing Status:   Future    Number of Occurrences:   1    Standing Expiration Date:   04/12/2023   Comprehensive metabolic panel    Standing Status:   Future    Number of Occurrences:   1    Standing Expiration Date:    04/12/2023   Retic Panel    Standing Status:   Future    Number of Occurrences:   1    Standing Expiration Date:   04/12/2023   CBC with Differential/Platelet    Standing Status:   Future    Standing Expiration Date:   04/12/2023   Iron and TIBC    Standing Status:   Future    Standing Expiration Date:   04/12/2023   Ferritin    Standing Status:   Future    Standing Expiration Date:   04/12/2023   CBC with Differential/Platelet    Standing Status:   Future    Standing Expiration Date:   04/12/2023   Iron and TIBC    Standing Status:   Future    Standing Expiration Date:   04/12/2023   Ferritin    Standing Status:   Future    Number of Occurrences:   1    Standing Expiration Date:   04/12/2023   Repeat iron labs in 6 weeks. Follow-up in 3 months All questions were answered. The patient knows to call the clinic with any problems, questions or concerns.  Earlie Server, MD, PhD [facility] Hematology Oncology 04/11/2022     HISTORY OF PRESENTING ILLNESS:  Melissa Christian is a  81 y.o.  female with PMH listed below who was referred to me for anemia Reviewed  patient's recent labs that was done.  She was found to have abnormal CBC on 02/22/2022, with a hemoglobin of 8.4, MCV 83.5.  B12 adequate, patient was advised to take ferrous sulfate 325 mg twice daily.  She had a repeat hemoglobin 03/31/2022 which was 7.9, ferritin 9.  Reviewed patient's previous labs ordered by primary care physician's office, anemia is chronic onset , since early 2023. Patient reports feeling tired.  No energy. She denies recent chest pain on exertion, pre-syncopal episodes, or palpitations She had not noticed any recent bleeding such as epistaxis, hematuria or hematochezia.  She denies over the counter NSAID ingestion. She is on antiplatelets agent-aspirin 81 mg. Continue had a colonoscopy. She was accompanied by her sister.   MEDICAL HISTORY:  Past Medical History:  Diagnosis Date   Anemia    CAD (coronary  artery disease)    a. NSTEMI 09/2015: cardiac cath: LM no obs dz, mLAD 95% s/p PCI/DES 0%, LCx no obs, RCA no obs, LVEF 55-65%   Chronic kidney disease (CKD), stage III (moderate) (HCC)    Diabetes mellitus with complication (HCC)    HLD (hyperlipidemia)    Hypertension    Low blood potassium    NSTEMI (non-ST elevated myocardial infarction) (HCC)    PONV (postoperative nausea and vomiting)    Valvular heart disease    a. 09/2015: EF 55-60%, challenging images unable to exclude mild mid-distal anterior to apical HK, nl LV dias fxn, mild AI/MR, normal size of left atrium, RV systolic function normal, PASP normal      SURGICAL HISTORY: Past Surgical History:  Procedure Laterality Date   BACK SURGERY     CARDIAC CATHETERIZATION N/A 09/23/2015   Procedure: Right and Left Heart Cath;  Surgeon: Minna Merritts, MD;  Location: [place] [facility];  Service: Cardiovascular;  Laterality: N/A;   CARDIAC CATHETERIZATION N/A 09/23/2015   Procedure: Coronary Stent Intervention;  Surgeon: Yolonda Kida, MD;  Location: [place] [facility];  Service: Cardiovascular;  Laterality: N/A;   CATARACT EXTRACTION W/PHACO Left 04/26/2017   Procedure: CATARACT EXTRACTION PHACO AND INTRAOCULAR LENS PLACEMENT ([place]) LEFT DIABETIC;  Surgeon: Leandrew Koyanagi, MD;  Location: [place];  Service: Ophthalmology;  Laterality: Left;  diabetic-oral meds   CATARACT EXTRACTION W/PHACO Right 05/31/2017   Procedure: CATARACT EXTRACTION PHACO AND INTRAOCULAR LENS PLACEMENT ([place]) RIGHT DIABETIC;  Surgeon: Leandrew Koyanagi, MD;  Location: [place];  Service: Ophthalmology;  Laterality: Right;   CHOLECYSTECTOMY N/A 06/16/2016   Procedure: LAPAROSCOPIC CHOLECYSTECTOMY WITH INTRAOPERATIVE CHOLANGIOGRAM;  Surgeon: Leonie Green, MD;  Location: [facility];  Service: General;  Laterality: N/A;   CORONARY STENT PLACEMENT     JOINT REPLACEMENT     LUMBAR FUSION     TOTAL HIP ARTHROPLASTY       SOCIAL HISTORY: Social History   Socioeconomic History   Marital status: Widowed    Spouse name: Not on file   Number of children: Not on file   Years of education: Not on file   Highest education level: Not on file  Occupational History   Not on file  Tobacco Use   Smoking status: Never   Smokeless tobacco: Never  Vaping Use   Vaping Use: Never used  Substance and Sexual Activity   Alcohol use: No    Alcohol/week: 0.0 standard drinks of alcohol   Drug use: No   Sexual activity: Not Currently    Birth control/protection: None, Post-menopausal  Other Topics Concern   Not on file  Social History Narrative   Not on file   Social Determinants of Health   Financial Resource Strain: Not on file  Food Insecurity: Not on file  Transportation Needs: Not on file  Physical Activity: Not on file  Stress: Not on file  Social Connections: Not on file  Intimate Partner Violence: Not on file    FAMILY HISTORY: Family History  Problem Relation Age of Onset   Diabetes Mother    Hypertension Mother    Heart disease Father    Cancer Maternal Grandfather     ALLERGIES:  is allergic to ferrous sulfate, keflex [cephalexin], percocet [oxycodone-acetaminophen], vicodin [hydrocodone-acetaminophen], ace inhibitors, and penicillins.  MEDICATIONS:  Current Outpatient Medications  Medication Sig Dispense Refill   aspirin EC 81 MG tablet Take 81 mg by mouth daily.     B Complex-C-Folic Acid (RENA-VITE RX) 1 MG TABS Take 1 tablet by mouth daily.     Cholecalciferol (VITAMIN D3) 2000 units TABS Take 2,000 Units by mouth.     cyanocobalamin (VITAMIN B12) 1000 MCG/ML injection Inject 1,000 mcg into the muscle every 14 (fourteen) days.     diltiazem (CARDIZEM CD) 240 MG 24 hr capsule      doxazosin (CARDURA) 2 MG tablet Take 1 tablet (2 mg total) by mouth 2 (two) times daily. 180 tablet 3   furosemide (LASIX) 40 MG tablet Take 1 tablet (40 mg total) by mouth daily. 90 tablet 3    metoprolol succinate (TOPROL-XL) 100 MG 24 hr tablet Take 1 tablet in the morning and 1/2 tablet in the evening. 126 tablet 3   pioglitazone (ACTOS) 30 MG tablet Take 30 mg by mouth daily.      rosuvastatin (CRESTOR) 40 MG tablet Take 1 tablet (40 mg total) by mouth daily. 90 tablet 3   triamterene-hydrochlorothiazide (DYAZIDE) 37.5-25 MG capsule Take 1 each (1 capsule total) by mouth every other day. 90 capsule 3   TRULICITY 1.5 JO/8.4ZY SOPN INJECT 1.5 MG SUBCUTANEOUSLY EVERY 7 (SEVEN) DAYS     ferrous sulfate 325 (65 FE) MG tablet Take 325 mg by mouth 2 (two) times daily. (Patient not taking: Reported on 02/04/2022)     glipiZIDE (GLUCOTROL) 10 MG tablet Take 20 mg by mouth daily before breakfast. (Patient not taking: Reported on 02/04/2022)     No current facility-administered medications for this visit.    Review of Systems  Constitutional:  Positive for fatigue. Negative for appetite change, chills and fever.  HENT:   Negative for hearing loss and voice change.   Eyes:  Negative for eye problems.  Respiratory:  Negative for chest tightness and cough.   Cardiovascular:  Negative for chest pain.  Gastrointestinal:  Negative for abdominal distention, abdominal pain and blood in stool.  Endocrine: Negative for hot flashes.  Genitourinary:  Negative for difficulty urinating and frequency.   Musculoskeletal:  Negative for arthralgias.  Skin:  Negative for itching and rash.  Neurological:  Negative for extremity weakness.  Hematological:  Negative for adenopathy.  Psychiatric/Behavioral:  Negative for confusion.     PHYSICAL EXAMINATION: ECOG PERFORMANCE STATUS: 2 - Symptomatic, <50% confined to bed Vitals:   04/11/22 1120  BP: (!) 125/45  Pulse: 63  Resp: 18  Temp: 97.8 F (36.6 C)  SpO2: 96%   Filed Weights   04/11/22 1120  Weight: 267 lb (121.1 kg)    Physical Exam Constitutional:      General: She is not in acute distress.    Appearance: She is obese.  Comments:  Patient sits in the wheelchair  HENT:     Head: Normocephalic and atraumatic.  Eyes:     General: No scleral icterus. Cardiovascular:     Rate and Rhythm: Normal rate and regular rhythm.     Heart sounds: Murmur heard.  Pulmonary:     Effort: Pulmonary effort is normal. No respiratory distress.     Breath sounds: No wheezing.  Abdominal:     General: Bowel sounds are normal. There is no distension.     Palpations: Abdomen is soft.  Musculoskeletal:        General: No deformity. Normal range of motion.     Cervical back: Normal range of motion and neck supple.  Skin:    General: Skin is warm and dry.     Coloration: Skin is pale.     Findings: No erythema.  Neurological:     Mental Status: She is alert and oriented to person, place, and time. Mental status is at baseline.     Cranial Nerves: No cranial nerve deficit.  Psychiatric:        Mood and Affect: Mood normal.      LABORATORY DATA:  I have reviewed the data as listed    Latest Ref Rng & Units 04/11/2022   12:19 PM 06/11/2020   10:14 PM 06/07/2016   11:44 AM  CBC  WBC 4.0 - 10.5 K/uL 7.0  8.4  10.4   Hemoglobin 12.0 - 15.0 g/dL 8.5  11.1  11.7   Hematocrit 36.0 - 46.0 % 28.9  37.0  35.3   Platelets 150 - 400 K/uL 231  296  318       Latest Ref Rng & Units 04/11/2022   12:19 PM 06/11/2020   10:14 PM 06/28/2017    8:57 AM  CMP  Glucose 70 - 99 mg/dL 113  142  249   BUN 8 - 23 mg/dL 38  21  25   Creatinine 0.44 - 1.00 mg/dL 1.70  1.79  1.69   Sodium 135 - 145 mmol/L 141  140  140   Potassium 3.5 - 5.1 mmol/L 3.4  4.7  5.1   Chloride 98 - 111 mmol/L 106  104  101   CO2 22 - 32 mmol/L _0 Calcium 8.9 - 10.3 mg/dL 9.0  9.2  9.0   Total Protein 6.5 - 8.1 g/dL 7.3     Total Bilirubin 0.3 - 1.2 mg/dL 0.6     Alkaline Phos 38 - 126 U/L 66     AST 15 - 41 U/L 24     ALT 0 - 44 U/L 12         Component Value Date/Time   IRON 42 04/11/2022 1219   TIBC 417 04/11/2022 1219   FERRITIN 11 04/11/2022 1219    IRONPCTSAT 10 (L) 04/11/2022 1219     RADIOGRAPHIC STUDIES: I have personally reviewed the radiological images as listed and agreed with the findings in the report. No results found.

## 2022-04-11 NOTE — Assessment & Plan Note (Signed)
IV Venofer to increase iron stores. Follow-up in the future.  If hemoglobin is persistently less than 10, consider erythropoietin replacement therapy.

## 2022-04-12 LAB — KAPPA/LAMBDA LIGHT CHAINS
Kappa free light chain: 56 mg/L — ABNORMAL HIGH (ref 3.3–19.4)
Kappa, lambda light chain ratio: 1.26 (ref 0.26–1.65)
Lambda free light chains: 44.6 mg/L — ABNORMAL HIGH (ref 5.7–26.3)

## 2022-04-15 ENCOUNTER — Ambulatory Visit
Admission: RE | Admit: 2022-04-15 | Discharge: 2022-04-15 | Disposition: A | Discharge status: Home / Self Care | Payer: Medicare HMO | Source: Ambulatory Visit | Attending: Internal Medicine | Admitting: Internal Medicine

## 2022-04-15 DIAGNOSIS — R634 Abnormal weight loss: Secondary | ICD-10-CM

## 2022-04-15 DIAGNOSIS — R1084 Generalized abdominal pain: Secondary | ICD-10-CM

## 2022-04-15 DIAGNOSIS — R109 Unspecified abdominal pain: Secondary | ICD-10-CM | POA: Diagnosis not present

## 2022-04-15 DIAGNOSIS — D500 Iron deficiency anemia secondary to blood loss (chronic): Secondary | ICD-10-CM

## 2022-04-15 LAB — MULTIPLE MYELOMA PANEL, SERUM
Albumin SerPl Elph-Mcnc: 3.4 g/dL (ref 2.9–4.4)
Albumin/Glob SerPl: 1.1 (ref 0.7–1.7)
Alpha 1: 0.3 g/dL (ref 0.0–0.4)
Alpha2 Glob SerPl Elph-Mcnc: 0.8 g/dL (ref 0.4–1.0)
B-Globulin SerPl Elph-Mcnc: 1.1 g/dL (ref 0.7–1.3)
Gamma Glob SerPl Elph-Mcnc: 1 g/dL (ref 0.4–1.8)
Globulin, Total: 3.1 g/dL (ref 2.2–3.9)
IgA: 354 mg/dL (ref 64–422)
IgG (Immunoglobin G), Serum: 1111 mg/dL (ref 586–1602)
IgM (Immunoglobulin M), Srm: 101 mg/dL (ref 26–217)
Total Protein ELP: 6.5 g/dL (ref 6.0–8.5)

## 2022-04-18 ENCOUNTER — Inpatient Hospital Stay: Payer: Medicare HMO

## 2022-04-18 VITALS — BP 114/53 | HR 73 | Temp 96.5°F | Resp 18

## 2022-04-18 DIAGNOSIS — D508 Other iron deficiency anemias: Secondary | ICD-10-CM

## 2022-04-18 DIAGNOSIS — D631 Anemia in chronic kidney disease: Secondary | ICD-10-CM | POA: Diagnosis not present

## 2022-04-18 DIAGNOSIS — N183 Chronic kidney disease, stage 3 unspecified: Secondary | ICD-10-CM | POA: Diagnosis not present

## 2022-04-18 DIAGNOSIS — Z7982 Long term (current) use of aspirin: Secondary | ICD-10-CM | POA: Diagnosis not present

## 2022-04-18 MED ORDER — SODIUM CHLORIDE 0.9 % IV SOLN
Freq: Once | INTRAVENOUS | Status: AC
  Filled 2022-04-18: qty 250

## 2022-04-18 MED ORDER — SODIUM CHLORIDE 0.9 % IV SOLN
200.0000 mg | Freq: Once | INTRAVENOUS | Status: AC
  Administered 2022-04-18: 200 mg via INTRAVENOUS
  Filled 2022-04-18: qty 200

## 2022-04-25 ENCOUNTER — Inpatient Hospital Stay: Payer: Medicare HMO

## 2022-04-25 VITALS — BP 104/39 | HR 68 | Temp 97.8°F | Resp 20

## 2022-04-25 DIAGNOSIS — N183 Chronic kidney disease, stage 3 unspecified: Secondary | ICD-10-CM | POA: Diagnosis not present

## 2022-04-25 DIAGNOSIS — D508 Other iron deficiency anemias: Secondary | ICD-10-CM

## 2022-04-25 DIAGNOSIS — D631 Anemia in chronic kidney disease: Secondary | ICD-10-CM | POA: Diagnosis not present

## 2022-04-25 DIAGNOSIS — Z7982 Long term (current) use of aspirin: Secondary | ICD-10-CM | POA: Diagnosis not present

## 2022-04-25 MED ORDER — SODIUM CHLORIDE 0.9 % IV SOLN
Freq: Once | INTRAVENOUS | Status: AC
  Filled 2022-04-25: qty 250

## 2022-04-25 MED ORDER — SODIUM CHLORIDE 0.9 % IV SOLN
200.0000 mg | Freq: Once | INTRAVENOUS | Status: AC
  Administered 2022-04-25: 200 mg via INTRAVENOUS
  Filled 2022-04-25: qty 200

## 2022-04-25 NOTE — Patient Instructions (Signed)

## 2022-05-02 ENCOUNTER — Inpatient Hospital Stay: Payer: Medicare HMO

## 2022-05-02 VITALS — BP 118/53 | HR 81 | Resp 18

## 2022-05-02 DIAGNOSIS — D631 Anemia in chronic kidney disease: Secondary | ICD-10-CM | POA: Diagnosis not present

## 2022-05-02 DIAGNOSIS — D508 Other iron deficiency anemias: Secondary | ICD-10-CM

## 2022-05-02 DIAGNOSIS — Z7982 Long term (current) use of aspirin: Secondary | ICD-10-CM | POA: Diagnosis not present

## 2022-05-02 DIAGNOSIS — N183 Chronic kidney disease, stage 3 unspecified: Secondary | ICD-10-CM | POA: Diagnosis not present

## 2022-05-02 MED ORDER — SODIUM CHLORIDE 0.9 % IV SOLN
200.0000 mg | Freq: Once | INTRAVENOUS | Status: AC
  Administered 2022-05-02: 200 mg via INTRAVENOUS
  Filled 2022-05-02: qty 200

## 2022-05-02 MED ORDER — SODIUM CHLORIDE 0.9 % IV SOLN
Freq: Once | INTRAVENOUS | Status: AC
  Filled 2022-05-02: qty 250

## 2022-05-08 NOTE — Progress Notes (Unsigned)
Cardiology Clinic Note   Patient Name: Melissa Christian Date of Encounter: 05/09/2022  Primary Care Provider:  Rusty Aus, MD Primary Cardiologist:  Ida Rogue, MD  Patient Profile    81 year old female with a history of CAD with NSTEMI 09/2015, with PCI/DES to the mid LAD, CKD 3, type 2 diabetes, hypertension, hyperlipidemia, AI, MR, who presents today for follow-up.  Past Medical History    Past Medical History:  Diagnosis Date   Anemia    CAD (coronary artery disease)    a. NSTEMI 09/2015: cardiac cath: LM no obs dz, mLAD 95% s/p PCI/DES 0%, LCx no obs, RCA no obs, LVEF 55-65%   Chronic kidney disease (CKD), stage III (moderate) (HCC)    Diabetes mellitus with complication (HCC)    HLD (hyperlipidemia)    Hypertension    Low blood potassium    NSTEMI (non-ST elevated myocardial infarction) (HCC)    PONV (postoperative nausea and vomiting)    Valvular heart disease    a. 09/2015: EF 55-60%, challenging images unable to exclude mild mid-distal anterior to apical HK, nl LV dias fxn, mild AI/MR, normal size of left atrium, RV systolic function normal, PASP normal     Past Surgical History:  Procedure Laterality Date   BACK SURGERY     CARDIAC CATHETERIZATION N/A 09/23/2015   Procedure: Right and Left Heart Cath;  Surgeon: Minna Merritts, MD;  Location: [place] [facility];  Service: Cardiovascular;  Laterality: N/A;   CARDIAC CATHETERIZATION N/A 09/23/2015   Procedure: Coronary Stent Intervention;  Surgeon: Yolonda Kida, MD;  Location: [place] [facility];  Service: Cardiovascular;  Laterality: N/A;   CATARACT EXTRACTION W/PHACO Left 04/26/2017   Procedure: CATARACT EXTRACTION PHACO AND INTRAOCULAR LENS PLACEMENT ([place]) LEFT DIABETIC;  Surgeon: Leandrew Koyanagi, MD;  Location: [place];  Service: Ophthalmology;  Laterality: Left;  diabetic-oral meds   CATARACT EXTRACTION W/PHACO Right 05/31/2017   Procedure: CATARACT EXTRACTION PHACO AND  INTRAOCULAR LENS PLACEMENT ([place]) RIGHT DIABETIC;  Surgeon: Leandrew Koyanagi, MD;  Location: [place];  Service: Ophthalmology;  Laterality: Right;   CHOLECYSTECTOMY N/A 06/16/2016   Procedure: LAPAROSCOPIC CHOLECYSTECTOMY WITH INTRAOPERATIVE CHOLANGIOGRAM;  Surgeon: Leonie Green, MD;  Location: [facility];  Service: General;  Laterality: N/A;   CORONARY STENT PLACEMENT     JOINT REPLACEMENT     LUMBAR FUSION     TOTAL HIP ARTHROPLASTY      Allergies  Allergies  Allergen Reactions   Ferrous Sulfate Itching   Keflex [Cephalexin] Itching   Percocet [Oxycodone-Acetaminophen] Itching   Vicodin [Hydrocodone-Acetaminophen] Itching   Ace Inhibitors Rash    elevated potassium at higher doses   Penicillins Rash    Has patient had a PCN reaction causing immediate rash, facial/tongue/throat swelling, SOB or lightheadedness with hypotension: No Has patient had a PCN reaction causing severe rash involving mucus membranes or skin necrosis: No Has patient had a PCN reaction that required hospitalization No Has patient had a PCN reaction occurring within the last 10 years: No If all of the above answers are "NO", then may proceed with Cephalosporin use.     History of Present Illness    Melissa Christian is a 81 year old female with a past medical history of coronary artery disease with NSTEMI in 09/2015 with PCI/DES to the mid LAD, CKD stage III, type 2 diabetes, hypertension, hyperlipidemia, AI, MR.  Echocardiogram 09/23/2015 with EF of 55-60%.  It was noted challenging imaging quality thus unable to  exclude mild mid to distal anterior wall and apical hypokinesis.  Mild AI/MR.  Repeat echo in 06/23/2017 showed EF 60-65%, no regional wall motion abnormalities, G1 DD, mild AI, mild MR, PASP normal.  Lexiscan Myoview 07/18/2017 for atypical chest discomfort ruled low risk.  Echo 09/2018 showed LVEF 60-65%, G1 DD, moderate TR, moderate MR.  She was last seen in clinic 02/04/2022 with  complaints of shortness of breath and lower extremity edema.  Her PCP had adjusted her furosemide and to help with the swelling.  Metoprolol succinate 100 mg the morning 50 in the evening was changed to Cardura 2 mg twice daily.  There were no other medication changes made or studies ordered.  She returns to clinic today stating that she has been doing fairly well.  She continues to have shortness of breath and peripheral edema.  But she states that her urine output has picked up since she is taking 40 mg of furosemide daily now.  Continue with her iron infusions weekly and is scheduled for blood work with oncology to follow-up on her iron levels and CBC coming up on 1120.  She denies any chest pain, palpitations, dizziness, lightheadedness.  She denies any hospitalizations or visits to the emergency department.  Home Medications    Current Outpatient Medications  Medication Sig Dispense Refill   aspirin EC 81 MG tablet Take 81 mg by mouth daily.     B Complex-C-Folic Acid (RENA-VITE RX) 1 MG TABS Take 1 tablet by mouth daily.     Cholecalciferol (VITAMIN D3) 2000 units TABS Take 2,000 Units by mouth daily.     cyanocobalamin (VITAMIN B12) 1000 MCG/ML injection Inject 1,000 mcg into the muscle every 14 (fourteen) days.     doxazosin (CARDURA) 2 MG tablet Take 1 tablet (2 mg total) by mouth 2 (two) times daily. 180 tablet 3   furosemide (LASIX) 40 MG tablet Take 1 tablet (40 mg total) by mouth daily. 90 tablet 3   IV Sets-Tubing (INFUSION SET) MISC by Does not apply route. Iron infusion weekly at facility     metoprolol succinate (TOPROL-XL) 100 MG 24 hr tablet Take 1 tablet in the morning and 1/2 tablet in the evening. 126 tablet 3   pioglitazone (ACTOS) 30 MG tablet Take 30 mg by mouth daily.      rosuvastatin (CRESTOR) 40 MG tablet Take 1 tablet (40 mg total) by mouth daily. 90 tablet 3   triamterene-hydrochlorothiazide (DYAZIDE) 37.5-25 MG capsule Take 1 each (1 capsule total) by mouth every  other day. 90 capsule 3   TRULICITY 1.5 FU/9.3AT SOPN INJECT 1.5 MG SUBCUTANEOUSLY EVERY 7 (SEVEN) DAYS     No current facility-administered medications for this visit.     Family History    Family History  Problem Relation Age of Onset   Diabetes Mother    Hypertension Mother    Heart disease Father    Cancer Maternal Grandfather    She indicated that the status of her mother is unknown. She indicated that the status of her father is unknown. She indicated that the status of her maternal grandfather is unknown.  Social History    Social History   Socioeconomic History   Marital status: Widowed    Spouse name: Not on file   Number of children: Not on file   Years of education: Not on file   Highest education level: Not on file  Occupational History   Not on file  Tobacco Use   Smoking status: Never  Smokeless tobacco: Never  Vaping Use   Vaping Use: Never used  Substance and Sexual Activity   Alcohol use: No    Alcohol/week: 0.0 standard drinks of alcohol   Drug use: No   Sexual activity: Not Currently    Birth control/protection: None, Post-menopausal  Other Topics Concern   Not on file  Social History Narrative   Not on file   Social Determinants of Health   Financial Resource Strain: Not on file  Food Insecurity: Not on file  Transportation Needs: Not on file  Physical Activity: Not on file  Stress: Not on file  Social Connections: Not on file  Intimate Partner Violence: Not on file     Review of Systems    General:  No chills, fever, night sweats or weight changes.  Endorses fatigue Cardiovascular:  No chest pain, endorses dyspnea on exertion, edema, denies orthopnea, palpitations, paroxysmal nocturnal dyspnea. Dermatological: No rash, lesions/masses Respiratory: No cough, endorses dyspnea Urologic: No hematuria, dysuria Abdominal:   No nausea, vomiting, diarrhea, bright red blood per rectum, melena, or hematemesis Neurologic:  No visual changes,  wkns, changes in mental status. All other systems reviewed and are otherwise negative except as noted above.   Physical Exam    VS:  BP (!) 130/54 (BP Location: Right Arm, Patient Position: Sitting, Cuff Size: Large)   Pulse 76   Ht '5\' 4"'$  (1.626 m)   Wt 257 lb (116.6 kg)   SpO2 93%   BMI 44.11 kg/m  , BMI Body mass index is 44.11 kg/m.     GEN: Well nourished, well developed, in no acute distress. HEENT: normal. Neck: Supple, no JVD, carotid bruits, or masses. Cardiac: RRR, II/VI systolic  murmur, without rubs, or gallops. No clubbing, cyanosis, 1+ edema to the bilateral lower extremities.  Radials/DP/PT 2+ and equal bilaterally.  Respiratory:  Respirations regular and unlabored, clear to auscultation bilaterally. GI: Soft, nontender, nondistended, BS + x 4. MS: no deformity or atrophy. Skin: warm and dry, no rash. Neuro:  Strength and sensation are intact. Psych: Normal affect.  Accessory Clinical Findings    ECG personally reviewed by me today-no new tracings were completed today  Lab Results  Component Value Date   WBC 7.0 04/11/2022   HGB 8.5 (L) 04/11/2022   HCT 28.9 (L) 04/11/2022   MCV 93.8 04/11/2022   PLT 231 04/11/2022   Lab Results  Component Value Date   CREATININE 1.70 (H) 04/11/2022   BUN 38 (H) 04/11/2022   NA 141 04/11/2022   K 3.4 (L) 04/11/2022   CL 106 04/11/2022   CO2 27 04/11/2022   Lab Results  Component Value Date   ALT 12 04/11/2022   AST 24 04/11/2022   ALKPHOS 66 04/11/2022   BILITOT 0.6 04/11/2022   Lab Results  Component Value Date   CHOL 174 09/24/2015   HDL 48 09/24/2015   LDLCALC 105 (H) 09/24/2015   TRIG 107 09/24/2015   CHOLHDL 3.6 09/24/2015    Lab Results  Component Value Date   HGBA1C 8.8 (H) 09/23/2015    Assessment & Plan   1.  Coronary disease involving native coronary artery of the native heart without angina.  Currently denies symptoms of angina.  At she is continued on aspirin 81 mg daily, Toprol-XL 100 mg  in the morning and 50 mg in the evening, and rosuvastatin 40 mg daily, and triamterene HCTZ 37.5-25 mg by mouth every other day.  2.  Essential hypertension with blood pressure today 130/54.  She has been continued on Cardura 2 mg twice daily, Lasix 40 mg daily, Toprol-XL 100 mg in the morning 50 mg in the evening, and triamterene-HCTZ 37.5-25 mg by mouth every other day.  She is also been encouraged to continue to monitor her pressures at home.  She requires no refills today of her medications at this time.  3.  Mixed hyperlipidemia with a last LDL of 52 on 02/22/2022.  She is continued on rosuvastatin 40 mg daily.  4.  Chronic diastolic CHF with pulmonary hypertension with continued shortness of breath.  Last echocardiogram completed in [facility].  With continued complaints of shortness of breath she has been scheduled for repeat echocardiogram.  She has been encouraged to continue with her current Lasix dose.  She is also being ordered a BMP to reevaluate her kidney function as her last BMP showed slightly elevated serum creatinine.  She has been encouraged to decrease her fluid intake and her sodium intake.  5.  Type 2 diabetes continues to be managed by PCP  6.  Disposition patient return to clinic to see MD/APP in 3 months or sooner if needed after echocardiogram is completed.  Etienne Mowers, NP 05/09/2022, 11:41 AM

## 2022-05-09 ENCOUNTER — Ambulatory Visit: Payer: Medicare HMO | Attending: Cardiology | Admitting: Cardiology

## 2022-05-09 ENCOUNTER — Inpatient Hospital Stay: Payer: Medicare HMO | Attending: Oncology

## 2022-05-09 ENCOUNTER — Encounter: Payer: Self-pay | Admitting: Cardiology

## 2022-05-09 VITALS — BP 130/54 | HR 76 | Ht 64.0 in | Wt 257.0 lb

## 2022-05-09 VITALS — BP 132/45 | HR 75

## 2022-05-09 DIAGNOSIS — E785 Hyperlipidemia, unspecified: Secondary | ICD-10-CM | POA: Diagnosis not present

## 2022-05-09 DIAGNOSIS — I2510 Atherosclerotic heart disease of native coronary artery without angina pectoris: Secondary | ICD-10-CM

## 2022-05-09 DIAGNOSIS — D508 Other iron deficiency anemias: Secondary | ICD-10-CM

## 2022-05-09 DIAGNOSIS — R0602 Shortness of breath: Secondary | ICD-10-CM

## 2022-05-09 DIAGNOSIS — I272 Pulmonary hypertension, unspecified: Secondary | ICD-10-CM | POA: Diagnosis not present

## 2022-05-09 DIAGNOSIS — D631 Anemia in chronic kidney disease: Secondary | ICD-10-CM | POA: Diagnosis not present

## 2022-05-09 DIAGNOSIS — Z79899 Other long term (current) drug therapy: Secondary | ICD-10-CM

## 2022-05-09 DIAGNOSIS — I5032 Chronic diastolic (congestive) heart failure: Secondary | ICD-10-CM

## 2022-05-09 DIAGNOSIS — N183 Chronic kidney disease, stage 3 unspecified: Secondary | ICD-10-CM | POA: Insufficient documentation

## 2022-05-09 DIAGNOSIS — I10 Essential (primary) hypertension: Secondary | ICD-10-CM

## 2022-05-09 MED ORDER — SODIUM CHLORIDE 0.9 % IV SOLN
200.0000 mg | Freq: Once | INTRAVENOUS | Status: AC
  Administered 2022-05-09: 200 mg via INTRAVENOUS
  Filled 2022-05-09: qty 200

## 2022-05-09 MED ORDER — SODIUM CHLORIDE 0.9 % IV SOLN
Freq: Once | INTRAVENOUS | Status: AC
  Filled 2022-05-09: qty 250

## 2022-05-09 NOTE — Patient Instructions (Signed)
Medication Instructions:  Your Physician recommend you continue on your current medication as directed.    *If you need a refill on your cardiac medications before your next appointment, please call your pharmacy*   Lab Work: Your physician recommends that you return for lab work (BMP)  If you have labs (blood work) drawn today and your tests are completely normal, you will receive your results only by: [facility] (if you have MyChart) OR A paper copy in the mail If you have any lab test that is abnormal or we need to change your treatment, we will call you to review the results.   Testing/Procedures: Your physician has requested that you have an echocardiogram. Echocardiography is a painless test that uses sound waves to create images of your heart. It provides your doctor with information about the size and shape of your heart and how well your heart's chambers and valves are working.   You may receive an ultrasound enhancing agent through an IV if needed to better visualize your heart during the echo. This procedure takes approximately one hour.  There are no restrictions for this procedure.  This will take place at [facility] ([id number], [place]    Follow-Up: At [facility], you and your health needs are our priority.  As part of our continuing mission to provide you with exceptional heart care, we have created designated Provider Care Teams.  These Care Teams include your primary Cardiologist (physician) and Advanced Practice Providers (APPs -  Physician Assistants and Nurse Practitioners) who all work together to provide you with the care you need, when you need it.  We recommend signing up for the patient portal called "MyChart".  Sign up information is provided on this After Visit Summary.  MyChart is used to connect with patients for Virtual Visits (Telemedicine).  Patients are able to view lab/test results, encounter notes,  upcoming appointments, etc.  Non-urgent messages can be sent to your provider as well.   To learn more about what you can do with MyChart, go to [email or web address].    Your next appointment:   3 month(s)  The format for your next appointment:   In Person  Provider:   Gerrie Nordmann, NP

## 2022-05-09 NOTE — Progress Notes (Signed)
Patient tolerated Venofer infusion well via 2nd IV site L FA. 1 st site IV blow slight swelling R FA.Marland Kitchen Patient denies pain & no redness. No questions/concerns voiced. Patient stable at discharge. AVS given.

## 2022-05-09 NOTE — Patient Instructions (Signed)

## 2022-05-13 MED FILL — Iron Sucrose Inj 20 MG/ML (Fe Equiv): INTRAVENOUS | Qty: 10 | Status: AC

## 2022-05-16 ENCOUNTER — Inpatient Hospital Stay: Payer: Medicare HMO

## 2022-05-16 VITALS — BP 116/45 | HR 68 | Temp 96.0°F | Resp 18

## 2022-05-16 DIAGNOSIS — N183 Chronic kidney disease, stage 3 unspecified: Secondary | ICD-10-CM | POA: Diagnosis not present

## 2022-05-16 DIAGNOSIS — D631 Anemia in chronic kidney disease: Secondary | ICD-10-CM | POA: Diagnosis not present

## 2022-05-16 DIAGNOSIS — D508 Other iron deficiency anemias: Secondary | ICD-10-CM

## 2022-05-16 MED ORDER — SODIUM CHLORIDE 0.9 % IV SOLN
200.0000 mg | Freq: Once | INTRAVENOUS | Status: AC
  Administered 2022-05-16: 200 mg via INTRAVENOUS
  Filled 2022-05-16: qty 200

## 2022-05-16 MED ORDER — SODIUM CHLORIDE 0.9 % IV SOLN
Freq: Once | INTRAVENOUS | Status: AC
  Filled 2022-05-16: qty 250

## 2022-05-20 ENCOUNTER — Other Ambulatory Visit: Payer: Self-pay

## 2022-05-20 DIAGNOSIS — D508 Other iron deficiency anemias: Secondary | ICD-10-CM

## 2022-05-20 MED FILL — Iron Sucrose Inj 20 MG/ML (Fe Equiv): INTRAVENOUS | Qty: 10 | Status: AC

## 2022-05-23 ENCOUNTER — Other Ambulatory Visit: Payer: Self-pay

## 2022-05-23 ENCOUNTER — Inpatient Hospital Stay: Payer: Medicare HMO

## 2022-05-23 DIAGNOSIS — D631 Anemia in chronic kidney disease: Secondary | ICD-10-CM

## 2022-05-23 DIAGNOSIS — D508 Other iron deficiency anemias: Secondary | ICD-10-CM

## 2022-05-23 DIAGNOSIS — N183 Chronic kidney disease, stage 3 unspecified: Secondary | ICD-10-CM | POA: Diagnosis not present

## 2022-05-23 LAB — CBC WITH DIFFERENTIAL/PLATELET
Abs Immature Granulocytes: 0.03 10*3/uL (ref 0.00–0.07)
Basophils Absolute: 0 10*3/uL (ref 0.0–0.1)
Basophils Relative: 1 %
Eosinophils Absolute: 0.2 10*3/uL (ref 0.0–0.5)
Eosinophils Relative: 3 %
HCT: 24.9 % — ABNORMAL LOW (ref 36.0–46.0)
Hemoglobin: 6.9 g/dL — ABNORMAL LOW (ref 12.0–15.0)
Immature Granulocytes: 1 %
Lymphocytes Relative: 8 %
Lymphs Abs: 0.5 10*3/uL — ABNORMAL LOW (ref 0.7–4.0)
MCH: 26.4 pg (ref 26.0–34.0)
MCHC: 27.7 g/dL — ABNORMAL LOW (ref 30.0–36.0)
MCV: 95.4 fL (ref 80.0–100.0)
Monocytes Absolute: 0.4 10*3/uL (ref 0.1–1.0)
Monocytes Relative: 7 %
Neutro Abs: 4.7 10*3/uL (ref 1.7–7.7)
Neutrophils Relative %: 80 %
Platelets: 236 10*3/uL (ref 150–400)
RBC: 2.61 MIL/uL — ABNORMAL LOW (ref 3.87–5.11)
RDW: 18 % — ABNORMAL HIGH (ref 11.5–15.5)
WBC: 5.9 10*3/uL (ref 4.0–10.5)
nRBC: 0 % (ref 0.0–0.2)

## 2022-05-23 LAB — BASIC METABOLIC PANEL
Anion gap: 8 (ref 5–15)
BUN: 42 mg/dL — ABNORMAL HIGH (ref 8–23)
CO2: 24 mmol/L (ref 22–32)
Calcium: 8.3 mg/dL — ABNORMAL LOW (ref 8.9–10.3)
Chloride: 103 mmol/L (ref 98–111)
Creatinine, Ser: 2.01 mg/dL — ABNORMAL HIGH (ref 0.44–1.00)
GFR, Estimated: 24 mL/min — ABNORMAL LOW (ref >60)
Glucose, Bld: 162 mg/dL — ABNORMAL HIGH (ref 70–99)
Potassium: 3.7 mmol/L (ref 3.5–5.1)
Sodium: 135 mmol/L (ref 135–145)

## 2022-05-23 LAB — FERRITIN: Ferritin: 31 ng/mL (ref 11–307)

## 2022-05-23 LAB — IRON AND TIBC
Iron: 17 ug/dL — ABNORMAL LOW (ref 28–170)
Saturation Ratios: 5 % — ABNORMAL LOW (ref 10.4–31.8)
TIBC: 372 ug/dL (ref 250–450)
UIBC: 355 ug/dL

## 2022-05-24 ENCOUNTER — Other Ambulatory Visit: Payer: Self-pay

## 2022-05-24 ENCOUNTER — Ambulatory Visit
Admission: RE | Admit: 2022-05-24 | Discharge: 2022-05-24 | Disposition: A | Discharge status: Home / Self Care | Payer: Medicare HMO | Source: Ambulatory Visit | Attending: Oncology | Admitting: Oncology

## 2022-05-24 ENCOUNTER — Telehealth: Payer: Self-pay

## 2022-05-24 ENCOUNTER — Other Ambulatory Visit: Payer: Self-pay | Admitting: Oncology

## 2022-05-24 VITALS — BP 135/49 | HR 71 | Temp 97.9°F | Resp 16 | Ht 64.0 in | Wt 257.0 lb

## 2022-05-24 DIAGNOSIS — Z01812 Encounter for preprocedural laboratory examination: Secondary | ICD-10-CM | POA: Insufficient documentation

## 2022-05-24 DIAGNOSIS — D649 Anemia, unspecified: Secondary | ICD-10-CM | POA: Insufficient documentation

## 2022-05-24 DIAGNOSIS — D631 Anemia in chronic kidney disease: Secondary | ICD-10-CM | POA: Diagnosis not present

## 2022-05-24 DIAGNOSIS — N183 Chronic kidney disease, stage 3 unspecified: Secondary | ICD-10-CM | POA: Diagnosis not present

## 2022-05-24 DIAGNOSIS — D508 Other iron deficiency anemias: Secondary | ICD-10-CM

## 2022-05-24 LAB — PREPARE RBC (CROSSMATCH)

## 2022-05-24 MED ORDER — HEPARIN SOD (PORK) LOCK FLUSH 100 UNIT/ML IV SOLN: 500.0000 [IU] | Freq: Every day | INTRAVENOUS | Status: DC | PRN

## 2022-05-24 MED ORDER — SODIUM CHLORIDE 0.9% IV SOLUTION: 250.0000 mL | Freq: Once | INTRAVENOUS | Status: DC

## 2022-05-24 MED ORDER — SODIUM CHLORIDE 0.9% FLUSH: 10.0000 mL | INTRAVENOUS | Status: DC | PRN

## 2022-05-24 MED ORDER — HEPARIN SOD (PORK) LOCK FLUSH 100 UNIT/ML IV SOLN: 250.0000 [IU] | INTRAVENOUS | Status: DC | PRN

## 2022-05-24 MED ORDER — ACETAMINOPHEN 325 MG PO TABS: 650.0000 mg | ORAL_TABLET | Freq: Once | ORAL | Status: DC

## 2022-05-24 MED ORDER — DIPHENHYDRAMINE HCL 25 MG PO CAPS: 25.0000 mg | ORAL_CAPSULE | Freq: Once | ORAL | Status: DC

## 2022-05-24 MED ORDER — SODIUM CHLORIDE 0.9% FLUSH: 3.0000 mL | INTRAVENOUS | Status: DC | PRN

## 2022-05-24 NOTE — Telephone Encounter (Signed)
Arbie Cookey (sister) informed of plan.   Pt received 2 units of blood today at same day surgery.   Checked with Scheduling and no room for blood or venofer next week. If blood is needed we will check with same day surgery that day and see if she can be accommodated.   Please schedule labs on 11/27 and inform pt or sister Arbie Cookey of appt.

## 2022-05-24 NOTE — Telephone Encounter (Signed)
-----   Message from Earlie Server, MD sent at 05/24/2022  9:12 AM EST ----- Hb is less than 7, suspect active bleeding from GI tract, she declines GI work up  recommend 2 units of PRBC ASAP  Repeat cbc hold tube 11/27 + IV venofer,  +/- D2 PRBC

## 2022-05-25 ENCOUNTER — Encounter: Payer: Self-pay | Admitting: Oncology

## 2022-05-25 LAB — TYPE AND SCREEN
ABO/RH(D): A POS
Antibody Screen: POSITIVE
Donor AG Type: NEGATIVE
Donor AG Type: NEGATIVE
PT AG Type: NEGATIVE
Unit division: 0
Unit division: 0

## 2022-05-25 LAB — BPAM RBC
Blood Product Expiration Date: 202312062359
Blood Product Expiration Date: 202312082359
ISSUE DATE / TIME: 202311211411
ISSUE DATE / TIME: 202311211624
Unit Type and Rh: 5100
Unit Type and Rh: 6200

## 2022-05-30 ENCOUNTER — Inpatient Hospital Stay: Payer: Medicare HMO

## 2022-05-30 ENCOUNTER — Telehealth: Payer: Self-pay

## 2022-05-30 DIAGNOSIS — D649 Anemia, unspecified: Secondary | ICD-10-CM

## 2022-05-30 DIAGNOSIS — D631 Anemia in chronic kidney disease: Secondary | ICD-10-CM | POA: Diagnosis not present

## 2022-05-30 DIAGNOSIS — N183 Chronic kidney disease, stage 3 unspecified: Secondary | ICD-10-CM | POA: Diagnosis not present

## 2022-05-30 LAB — CBC WITH DIFFERENTIAL/PLATELET
Abs Immature Granulocytes: 0.03 10*3/uL (ref 0.00–0.07)
Basophils Absolute: 0.1 10*3/uL (ref 0.0–0.1)
Basophils Relative: 1 %
Eosinophils Absolute: 0.3 10*3/uL (ref 0.0–0.5)
Eosinophils Relative: 4 %
HCT: 31.1 % — ABNORMAL LOW (ref 36.0–46.0)
Hemoglobin: 9.1 g/dL — ABNORMAL LOW (ref 12.0–15.0)
Immature Granulocytes: 1 %
Lymphocytes Relative: 9 %
Lymphs Abs: 0.6 10*3/uL — ABNORMAL LOW (ref 0.7–4.0)
MCH: 26.8 pg (ref 26.0–34.0)
MCHC: 29.3 g/dL — ABNORMAL LOW (ref 30.0–36.0)
MCV: 91.7 fL (ref 80.0–100.0)
Monocytes Absolute: 0.5 10*3/uL (ref 0.1–1.0)
Monocytes Relative: 9 %
Neutro Abs: 4.8 10*3/uL (ref 1.7–7.7)
Neutrophils Relative %: 76 %
Platelets: 243 10*3/uL (ref 150–400)
RBC: 3.39 MIL/uL — ABNORMAL LOW (ref 3.87–5.11)
RDW: 17.5 % — ABNORMAL HIGH (ref 11.5–15.5)
WBC: 6.3 10*3/uL (ref 4.0–10.5)
nRBC: 0 % (ref 0.0–0.2)

## 2022-05-30 LAB — SAMPLE TO BLOOD BANK

## 2022-05-30 NOTE — Telephone Encounter (Signed)
Called pt and no answer. Called and spoke to pt's sister Arbie Cookey) to inform her that hemoglobin was 9.1 and that blood tranfusion does not need to be set up.   Please move appts in January sooner, as follows:    labs in approx 2 weeks  MD/ venofer or retacrit 1-2 days after labs   Please call pt with new appt details, if no answer, you may call her sister Arbie Cookey.

## 2022-05-31 ENCOUNTER — Encounter: Payer: Self-pay | Admitting: Oncology

## 2022-06-14 ENCOUNTER — Other Ambulatory Visit: Payer: Self-pay

## 2022-06-14 ENCOUNTER — Inpatient Hospital Stay: Payer: Medicare HMO | Attending: Oncology

## 2022-06-14 DIAGNOSIS — N183 Chronic kidney disease, stage 3 unspecified: Secondary | ICD-10-CM | POA: Diagnosis not present

## 2022-06-14 DIAGNOSIS — D631 Anemia in chronic kidney disease: Secondary | ICD-10-CM | POA: Diagnosis not present

## 2022-06-14 DIAGNOSIS — D649 Anemia, unspecified: Secondary | ICD-10-CM

## 2022-06-14 DIAGNOSIS — K921 Melena: Secondary | ICD-10-CM | POA: Diagnosis not present

## 2022-06-14 LAB — CBC WITH DIFFERENTIAL/PLATELET
Abs Immature Granulocytes: 0.01 10*3/uL (ref 0.00–0.07)
Basophils Absolute: 0 10*3/uL (ref 0.0–0.1)
Basophils Relative: 1 %
Eosinophils Absolute: 0.2 10*3/uL (ref 0.0–0.5)
Eosinophils Relative: 3 %
HCT: 27.8 % — ABNORMAL LOW (ref 36.0–46.0)
Hemoglobin: 8 g/dL — ABNORMAL LOW (ref 12.0–15.0)
Immature Granulocytes: 0 %
Lymphocytes Relative: 9 %
Lymphs Abs: 0.5 10*3/uL — ABNORMAL LOW (ref 0.7–4.0)
MCH: 25.7 pg — ABNORMAL LOW (ref 26.0–34.0)
MCHC: 28.8 g/dL — ABNORMAL LOW (ref 30.0–36.0)
MCV: 89.4 fL (ref 80.0–100.0)
Monocytes Absolute: 0.4 10*3/uL (ref 0.1–1.0)
Monocytes Relative: 6 %
Neutro Abs: 4.5 10*3/uL (ref 1.7–7.7)
Neutrophils Relative %: 81 %
Platelets: 233 10*3/uL (ref 150–400)
RBC: 3.11 MIL/uL — ABNORMAL LOW (ref 3.87–5.11)
RDW: 17.5 % — ABNORMAL HIGH (ref 11.5–15.5)
WBC: 5.6 10*3/uL (ref 4.0–10.5)
nRBC: 0 % (ref 0.0–0.2)

## 2022-06-14 LAB — IRON AND TIBC
Iron: 33 ug/dL (ref 28–170)
Saturation Ratios: 9 % — ABNORMAL LOW (ref 10.4–31.8)
TIBC: 377 ug/dL (ref 250–450)
UIBC: 344 ug/dL

## 2022-06-14 LAB — FERRITIN: Ferritin: 12 ng/mL (ref 11–307)

## 2022-06-17 ENCOUNTER — Inpatient Hospital Stay: Payer: Medicare HMO | Admitting: Oncology

## 2022-06-17 ENCOUNTER — Inpatient Hospital Stay: Payer: Medicare HMO

## 2022-06-17 ENCOUNTER — Encounter: Payer: Self-pay | Admitting: Oncology

## 2022-06-17 VITALS — BP 108/82 | HR 68 | Temp 98.2°F | Resp 18 | Wt 257.0 lb

## 2022-06-17 VITALS — BP 123/44 | HR 63 | Resp 20

## 2022-06-17 DIAGNOSIS — K921 Melena: Secondary | ICD-10-CM

## 2022-06-17 DIAGNOSIS — D508 Other iron deficiency anemias: Secondary | ICD-10-CM

## 2022-06-17 DIAGNOSIS — N183 Chronic kidney disease, stage 3 unspecified: Secondary | ICD-10-CM

## 2022-06-17 DIAGNOSIS — D631 Anemia in chronic kidney disease: Secondary | ICD-10-CM

## 2022-06-17 MED ORDER — SODIUM CHLORIDE 0.9 % IV SOLN
200.0000 mg | Freq: Once | INTRAVENOUS | Status: AC
  Administered 2022-06-17: 200 mg via INTRAVENOUS
  Filled 2022-06-17: qty 200

## 2022-06-17 MED ORDER — SODIUM CHLORIDE 0.9 % IV SOLN
INTRAVENOUS | Status: DC
  Filled 2022-06-17: qty 250

## 2022-06-17 NOTE — Patient Instructions (Signed)

## 2022-06-17 NOTE — Assessment & Plan Note (Signed)
Labs reviewed and discussed with patient. severe iron deficiency anemia. Suspect chronic blood loss from GI tract.  Patient previously declined GI work-up. S/p IV Venofer, Labs are reviewed and discussed with patient. Persistent severe iron deficiency anemia Recommend additional IV Venofer treatments.

## 2022-06-18 ENCOUNTER — Encounter: Payer: Self-pay | Admitting: Oncology

## 2022-06-18 DIAGNOSIS — D631 Anemia in chronic kidney disease: Secondary | ICD-10-CM | POA: Insufficient documentation

## 2022-06-18 DIAGNOSIS — K921 Melena: Secondary | ICD-10-CM | POA: Insufficient documentation

## 2022-06-18 NOTE — Assessment & Plan Note (Addendum)
IV Venofer to increase iron stores. Recommend retacrit 40,000 unit x1  Rationale and side effects of erythropoietin replacement therapy were reviewed with patient.

## 2022-06-18 NOTE — Progress Notes (Signed)
Hematology/Oncology Progress note [phone] [phone]         Patient Care Team: Rusty Aus, MD as PCP - General (Internal Medicine) Minna Merritts, MD as PCP - Cardiology (Cardiology) Minna Merritts, MD as Consulting Physician (Cardiology) Earlie Server, MD as Consulting Physician (Oncology)   CHIEF COMPLAINTS/REASON FOR VISIT:  Anemia  ASSESSMENT & PLAN:  IDA (iron deficiency anemia) Labs reviewed and discussed with patient. severe iron deficiency anemia. Suspect chronic blood loss from GI tract.  Patient previously declined GI work-up. S/p IV Venofer, Labs are reviewed and discussed with patient. Persistent severe iron deficiency anemia Recommend additional IV Venofer treatments.     Anemia in stage 3 chronic kidney disease (HCC) IV Venofer to increase iron stores. Recommend retacrit 40,000 unit x1  Rationale and side effects of erythropoietin replacement therapy were reviewed with patient.   Melena IDA is likely due ongoing blood loss.  We had a lengthy discussion with patient and she agrees with GI workup. I discussed case with gastroenterology via secure chat.    Orders Placed This Encounter  Procedures   CBC with Differential/Platelet    Standing Status:   Future    Standing Expiration Date:   06/18/2023   Sample to Blood Bank    Standing Status:   Future    Standing Expiration Date:   06/18/2023   Follow-up per LOS All questions were answered. The patient knows to call the clinic with any problems, questions or concerns.  Earlie Server, MD, PhD [facility] Hematology Oncology 06/17/2022     HISTORY OF PRESENTING ILLNESS:  Melissa Christian is a  81 y.o.  female with PMH listed below who was referred to me for anemia Reviewed patient's recent labs that was done.  She was found to have abnormal CBC on 02/22/2022, with a hemoglobin of 8.4, MCV 83.5.  B12 adequate, patient was advised to take ferrous sulfate 325 mg twice daily.  She  had a repeat hemoglobin 03/31/2022 which was 7.9, ferritin 9.  Reviewed patient's previous labs ordered by primary care physician's office, anemia is chronic onset , since early 2023. Patient reports feeling tired.  No energy. She denies recent chest pain on exertion, pre-syncopal episodes, or palpitations She had not noticed any recent bleeding such as epistaxis, hematuria or hematochezia.  She denies over the counter NSAID ingestion. She is on antiplatelets agent-aspirin 81 mg. Continue had a colonoscopy. She was accompanied by her sister.  INTERVAL HISTORY Melissa Christian is a 81 y.o. female who has above history reviewed by me today presents for follow up visit for anemia.  S/p IV venofer.  Still feel fatigued. Stool is dark". No abdominal pain, nausea vomiting.   MEDICAL HISTORY:  Past Medical History:  Diagnosis Date   Anemia    CAD (coronary artery disease)    a. NSTEMI 09/2015: cardiac cath: LM no obs dz, mLAD 95% s/p PCI/DES 0%, LCx no obs, RCA no obs, LVEF 55-65%   Chronic kidney disease (CKD), stage III (moderate) (HCC)    Diabetes mellitus with complication (HCC)    HLD (hyperlipidemia)    Hypertension    Low blood potassium    NSTEMI (non-ST elevated myocardial infarction) (HCC)    PONV (postoperative nausea and vomiting)    Valvular heart disease    a. 09/2015: EF 55-60%, challenging images unable to exclude mild mid-distal anterior to apical HK, nl LV dias fxn, mild AI/MR, normal size of left atrium, RV systolic function normal, PASP  normal      SURGICAL HISTORY: Past Surgical History:  Procedure Laterality Date   BACK SURGERY     CARDIAC CATHETERIZATION N/A 09/23/2015   Procedure: Right and Left Heart Cath;  Surgeon: Minna Merritts, MD;  Location: [facility];  Service: Cardiovascular;  Laterality: N/A;   CARDIAC CATHETERIZATION N/A 09/23/2015   Procedure: Coronary Stent Intervention;  Surgeon: Yolonda Kida, MD;  Location: [facility];   Service: Cardiovascular;  Laterality: N/A;   CATARACT EXTRACTION W/PHACO Left 04/26/2017   Procedure: CATARACT EXTRACTION PHACO AND INTRAOCULAR LENS PLACEMENT ([place]) LEFT DIABETIC;  Surgeon: Leandrew Koyanagi, MD;  Location: [facility];  Service: Ophthalmology;  Laterality: Left;  diabetic-oral meds   CATARACT EXTRACTION W/PHACO Right 05/31/2017   Procedure: CATARACT EXTRACTION PHACO AND INTRAOCULAR LENS PLACEMENT ([place]) RIGHT DIABETIC;  Surgeon: Leandrew Koyanagi, MD;  Location: [facility];  Service: Ophthalmology;  Laterality: Right;   CHOLECYSTECTOMY N/A 06/16/2016   Procedure: LAPAROSCOPIC CHOLECYSTECTOMY WITH INTRAOPERATIVE CHOLANGIOGRAM;  Surgeon: Leonie Green, MD;  Location: [facility];  Service: General;  Laterality: N/A;   CORONARY STENT PLACEMENT     JOINT REPLACEMENT     LUMBAR FUSION     TOTAL HIP ARTHROPLASTY      SOCIAL HISTORY: Social History   Socioeconomic History   Marital status: Widowed    Spouse name: Not on file   Number of children: Not on file   Years of education: Not on file   Highest education level: Not on file  Occupational History   Not on file  Tobacco Use   Smoking status: Never   Smokeless tobacco: Never  Vaping Use   Vaping Use: Never used  Substance and Sexual Activity   Alcohol use: No    Alcohol/week: 0.0 standard drinks of alcohol   Drug use: No   Sexual activity: Not Currently    Birth control/protection: None, Post-menopausal  Other Topics Concern   Not on file  Social History Narrative   Not on file   Social Determinants of Health   Financial Resource Strain: Not on file  Food Insecurity: Not on file  Transportation Needs: Not on file  Physical Activity: Not on file  Stress: Not on file  Social Connections: Not on file  Intimate Partner Violence: Not on file    FAMILY HISTORY: Family History  Problem Relation Age of Onset   Diabetes Mother    Hypertension Mother    Heart disease Father     Cancer Maternal Grandfather     ALLERGIES:  is allergic to ferrous sulfate, keflex [cephalexin], percocet [oxycodone-acetaminophen], vicodin [hydrocodone-acetaminophen], ace inhibitors, and penicillins.  MEDICATIONS:  Current Outpatient Medications  Medication Sig Dispense Refill   aspirin EC 81 MG tablet Take 81 mg by mouth daily.     B Complex-C-Folic Acid (RENA-VITE RX) 1 MG TABS Take 1 tablet by mouth daily.     Cholecalciferol (VITAMIN D3) 2000 units TABS Take 2,000 Units by mouth daily.     cyanocobalamin (VITAMIN B12) 1000 MCG/ML injection Inject 1,000 mcg into the muscle every 14 (fourteen) days.     doxazosin (CARDURA) 2 MG tablet Take 1 tablet (2 mg total) by mouth 2 (two) times daily. 180 tablet 3   furosemide (LASIX) 40 MG tablet Take 1 tablet (40 mg total) by mouth daily. 90 tablet 3   IV Sets-Tubing (INFUSION SET) MISC by Does not apply route. Iron infusion weekly at facility     metoprolol succinate (TOPROL-XL) 100 MG  24 hr tablet Take 1 tablet in the morning and 1/2 tablet in the evening. 126 tablet 3   pioglitazone (ACTOS) 30 MG tablet Take 30 mg by mouth daily.      rosuvastatin (CRESTOR) 40 MG tablet Take 1 tablet (40 mg total) by mouth daily. 90 tablet 3   triamterene-hydrochlorothiazide (DYAZIDE) 37.5-25 MG capsule Take 1 each (1 capsule total) by mouth every other day. 90 capsule 3   TRULICITY 1.5 KW/4.0XB SOPN INJECT 1.5 MG SUBCUTANEOUSLY EVERY 7 (SEVEN) DAYS     No current facility-administered medications for this visit.    Review of Systems  Constitutional:  Positive for fatigue. Negative for appetite change, chills and fever.  HENT:   Negative for hearing loss and voice change.   Eyes:  Negative for eye problems.  Respiratory:  Negative for chest tightness and cough.   Cardiovascular:  Negative for chest pain.  Gastrointestinal:  Negative for abdominal distention, abdominal pain and blood in stool.  Endocrine: Negative for hot flashes.  Genitourinary:   Negative for difficulty urinating and frequency.   Musculoskeletal:  Negative for arthralgias.  Skin:  Negative for itching and rash.  Neurological:  Negative for extremity weakness.  Hematological:  Negative for adenopathy.  Psychiatric/Behavioral:  Negative for confusion.     PHYSICAL EXAMINATION: ECOG PERFORMANCE STATUS: 2 - Symptomatic, <50% confined to bed Vitals:   06/17/22 1207  BP: 108/82  Pulse: 68  Resp: 18  Temp: 98.2 F (36.8 C)   Filed Weights   06/17/22 1207  Weight: 257 lb (116.6 kg)    Physical Exam Constitutional:      General: She is not in acute distress.    Appearance: She is obese.     Comments: Patient sits in the wheelchair  HENT:     Head: Normocephalic and atraumatic.  Eyes:     General: No scleral icterus. Cardiovascular:     Rate and Rhythm: Normal rate and regular rhythm.     Heart sounds: Murmur heard.  Pulmonary:     Effort: Pulmonary effort is normal. No respiratory distress.     Breath sounds: No wheezing.  Abdominal:     General: Bowel sounds are normal. There is no distension.     Palpations: Abdomen is soft.  Musculoskeletal:        General: No deformity. Normal range of motion.     Cervical back: Normal range of motion and neck supple.  Skin:    General: Skin is warm and dry.     Coloration: Skin is pale.     Findings: No erythema.  Neurological:     Mental Status: She is alert and oriented to person, place, and time. Mental status is at baseline.     Cranial Nerves: No cranial nerve deficit.  Psychiatric:        Mood and Affect: Mood normal.      LABORATORY DATA:  I have reviewed the data as listed    Latest Ref Rng & Units 06/14/2022   12:55 PM 05/30/2022    9:52 AM 05/23/2022    9:36 AM  CBC  WBC 4.0 - 10.5 K/uL 5.6  6.3  5.9   Hemoglobin 12.0 - 15.0 g/dL 8.0  9.1  6.9   Hematocrit 36.0 - 46.0 % 27.8  31.1  24.9   Platelets 150 - 400 K/uL 233  243  236       Latest Ref Rng & Units 05/23/2022    9:36 AM  04/11/2022  12:19 PM 06/11/2020   10:14 PM  CMP  Glucose 70 - 99 mg/dL 162  113  142   BUN 8 - 23 mg/dL 42  38  21   Creatinine 0.44 - 1.00 mg/dL 2.01  1.70  1.79   Sodium 135 - 145 mmol/L 135  141  140   Potassium 3.5 - 5.1 mmol/L 3.7  3.4  4.7   Chloride 98 - 111 mmol/L 103  106  104   CO2 22 - 32 mmol/L '24  27  25   '$ Calcium 8.9 - 10.3 mg/dL 8.3  9.0  9.2   Total Protein 6.5 - 8.1 g/dL  7.3    Total Bilirubin 0.3 - 1.2 mg/dL  0.6    Alkaline Phos 38 - 126 U/L  66    AST 15 - 41 U/L  24    ALT 0 - 44 U/L  12        Component Value Date/Time   IRON 33 06/14/2022 1255   TIBC 377 06/14/2022 1255   FERRITIN 12 06/14/2022 1255   IRONPCTSAT 9 (L) 06/14/2022 1255     RADIOGRAPHIC STUDIES: I have personally reviewed the radiological images as listed and agreed with the findings in the report. No results found.

## 2022-06-18 NOTE — Assessment & Plan Note (Addendum)
IDA is likely due ongoing blood loss.  We had a lengthy discussion with patient and she agrees with GI workup. I discussed case with gastroenterology via secure chat.

## 2022-06-20 ENCOUNTER — Inpatient Hospital Stay: Payer: Medicare HMO

## 2022-06-20 ENCOUNTER — Other Ambulatory Visit: Payer: Self-pay | Admitting: Oncology

## 2022-06-20 VITALS — BP 112/37 | HR 61 | Temp 96.9°F | Resp 19

## 2022-06-20 DIAGNOSIS — D508 Other iron deficiency anemias: Secondary | ICD-10-CM

## 2022-06-20 DIAGNOSIS — D631 Anemia in chronic kidney disease: Secondary | ICD-10-CM | POA: Diagnosis not present

## 2022-06-20 DIAGNOSIS — K921 Melena: Secondary | ICD-10-CM | POA: Diagnosis not present

## 2022-06-20 DIAGNOSIS — N183 Chronic kidney disease, stage 3 unspecified: Secondary | ICD-10-CM | POA: Diagnosis not present

## 2022-06-20 MED ORDER — SODIUM CHLORIDE 0.9 % IV SOLN
200.0000 mg | Freq: Once | INTRAVENOUS | Status: AC
  Administered 2022-06-20: 200 mg via INTRAVENOUS
  Filled 2022-06-20: qty 200

## 2022-06-20 MED ORDER — SODIUM CHLORIDE 0.9 % IV SOLN
INTRAVENOUS | Status: DC
  Filled 2022-06-20: qty 250

## 2022-06-20 NOTE — Patient Instructions (Signed)
[facility] AT [place]  Discharge Instructions: Thank you for choosing [facility] to provide your oncology and hematology care.  If you have a lab appointment with the [facility], please go directly to the [facility] and check in at the registration area.  Wear comfortable clothing and clothing appropriate for easy access to any Portacath or PICC line.   We strive to give you quality time with your provider. You may need to reschedule your appointment if you arrive late (15 or more minutes).  Arriving late affects you and other patients whose appointments are after yours.  Also, if you miss three or more appointments without notifying the office, you may be dismissed from the clinic at the provider's discretion.      For prescription refill requests, have your pharmacy contact our office and allow 72 hours for refills to be completed.    Today you received the following chemotherapy and/or immunotherapy agents Venofer      To help prevent nausea and vomiting after your treatment, we encourage you to take your nausea medication as directed.  BELOW ARE SYMPTOMS THAT SHOULD BE REPORTED IMMEDIATELY: *FEVER GREATER THAN 100.4 F (38 C) OR HIGHER *CHILLS OR SWEATING *NAUSEA AND VOMITING THAT IS NOT CONTROLLED WITH YOUR NAUSEA MEDICATION *UNUSUAL SHORTNESS OF BREATH *UNUSUAL BRUISING OR BLEEDING *URINARY PROBLEMS (pain or burning when urinating, or frequent urination) *BOWEL PROBLEMS (unusual diarrhea, constipation, pain near the anus) TENDERNESS IN MOUTH AND THROAT WITH OR WITHOUT PRESENCE OF ULCERS (sore throat, sores in mouth, or a toothache) UNUSUAL RASH, SWELLING OR PAIN  UNUSUAL VAGINAL DISCHARGE OR ITCHING   Items with * indicate a potential emergency and should be followed up as soon as possible or go to the Emergency Department if any problems should occur.  Please show the CHEMOTHERAPY ALERT CARD or IMMUNOTHERAPY ALERT CARD at check-in to the  Emergency Department and triage nurse.  Should you have questions after your visit or need to cancel or reschedule your appointment, please contact [facility] [place] AT [place]  [phone] and follow the prompts.  Office hours are 8:00 a.m. to 4:30 p.m. Monday - Friday. Please note that voicemails left after 4:00 p.m. may not be returned until the following business day.  We are closed weekends and major holidays. You have access to a nurse at all times for urgent questions. Please call the main number to the clinic [phone] and follow the prompts.  For any non-urgent questions, you may also contact your provider using MyChart. We now offer e-Visits for anyone 90 and older to request care online for non-urgent symptoms. For details visit [email or web address].   Also download the MyChart app! Go to the app store, search "MyChart", open the app, select [facility], and log in with your MyChart username and password.  Masks are optional in the cancer centers. If you would like for your care team to wear a mask while they are taking care of you, please let them know. For doctor visits, patients may have with them one support person who is at least 82 years old. At this time, visitors are not allowed in the infusion area.

## 2022-06-21 ENCOUNTER — Ambulatory Visit: Payer: Medicare HMO

## 2022-06-21 DIAGNOSIS — R194 Change in bowel habit: Secondary | ICD-10-CM | POA: Diagnosis not present

## 2022-06-21 DIAGNOSIS — K921 Melena: Secondary | ICD-10-CM | POA: Diagnosis not present

## 2022-06-21 DIAGNOSIS — D509 Iron deficiency anemia, unspecified: Secondary | ICD-10-CM | POA: Diagnosis not present

## 2022-06-21 DIAGNOSIS — N184 Chronic kidney disease, stage 4 (severe): Secondary | ICD-10-CM | POA: Diagnosis not present

## 2022-06-23 MED FILL — Iron Sucrose Inj 20 MG/ML (Fe Equiv): INTRAVENOUS | Qty: 10 | Status: AC

## 2022-06-24 ENCOUNTER — Inpatient Hospital Stay: Payer: Medicare HMO

## 2022-06-24 VITALS — BP 101/57 | HR 64 | Temp 98.1°F

## 2022-06-24 DIAGNOSIS — N183 Chronic kidney disease, stage 3 unspecified: Secondary | ICD-10-CM | POA: Diagnosis not present

## 2022-06-24 DIAGNOSIS — K921 Melena: Secondary | ICD-10-CM | POA: Diagnosis not present

## 2022-06-24 DIAGNOSIS — D631 Anemia in chronic kidney disease: Secondary | ICD-10-CM | POA: Diagnosis not present

## 2022-06-24 DIAGNOSIS — D508 Other iron deficiency anemias: Secondary | ICD-10-CM

## 2022-06-24 MED ORDER — SODIUM CHLORIDE 0.9 % IV SOLN
200.0000 mg | Freq: Once | INTRAVENOUS | Status: AC
  Administered 2022-06-24: 200 mg via INTRAVENOUS
  Filled 2022-06-24: qty 200

## 2022-06-24 MED ORDER — SODIUM CHLORIDE 0.9 % IV SOLN
Freq: Once | INTRAVENOUS | Status: AC
  Filled 2022-06-24: qty 250

## 2022-06-24 MED ORDER — SODIUM CHLORIDE 0.9% FLUSH
10.0000 mL | Freq: Once | INTRAVENOUS | Status: AC | PRN
  Administered 2022-06-24: 10 mL
  Filled 2022-06-24: qty 10

## 2022-06-24 NOTE — Patient Instructions (Signed)

## 2022-06-24 NOTE — Progress Notes (Signed)
Patient tolerated Venofer infusion well. Explained recommendation of 30 min post monitoring. Patient refused to wait post monitoring. Educated on what signs to watch for & to call with any concerns. No question, discharged. Stable

## 2022-06-25 ENCOUNTER — Emergency Department: Payer: Medicare HMO

## 2022-06-25 ENCOUNTER — Inpatient Hospital Stay
Admission: EM | Admit: 2022-06-25 | Discharge: 2022-07-01 | DRG: 378 | Disposition: A | Discharge status: Home / Self Care | Payer: Medicare HMO | Attending: Internal Medicine | Admitting: Internal Medicine

## 2022-06-25 ENCOUNTER — Other Ambulatory Visit: Payer: Self-pay

## 2022-06-25 DIAGNOSIS — Z809 Family history of malignant neoplasm, unspecified: Secondary | ICD-10-CM | POA: Diagnosis not present

## 2022-06-25 DIAGNOSIS — E1122 Type 2 diabetes mellitus with diabetic chronic kidney disease: Secondary | ICD-10-CM | POA: Diagnosis present

## 2022-06-25 DIAGNOSIS — Z96649 Presence of unspecified artificial hip joint: Secondary | ICD-10-CM | POA: Diagnosis present

## 2022-06-25 DIAGNOSIS — I129 Hypertensive chronic kidney disease with stage 1 through stage 4 chronic kidney disease, or unspecified chronic kidney disease: Secondary | ICD-10-CM | POA: Diagnosis not present

## 2022-06-25 DIAGNOSIS — K317 Polyp of stomach and duodenum: Secondary | ICD-10-CM | POA: Diagnosis present

## 2022-06-25 DIAGNOSIS — Z833 Family history of diabetes mellitus: Secondary | ICD-10-CM

## 2022-06-25 DIAGNOSIS — K921 Melena: Principal | ICD-10-CM | POA: Diagnosis present

## 2022-06-25 DIAGNOSIS — Z6841 Body Mass Index (BMI) 40.0 and over, adult: Secondary | ICD-10-CM

## 2022-06-25 DIAGNOSIS — Z955 Presence of coronary angioplasty implant and graft: Secondary | ICD-10-CM

## 2022-06-25 DIAGNOSIS — Z7982 Long term (current) use of aspirin: Secondary | ICD-10-CM | POA: Diagnosis not present

## 2022-06-25 DIAGNOSIS — D649 Anemia, unspecified: Secondary | ICD-10-CM | POA: Diagnosis present

## 2022-06-25 DIAGNOSIS — D62 Acute posthemorrhagic anemia: Secondary | ICD-10-CM | POA: Diagnosis not present

## 2022-06-25 DIAGNOSIS — R0602 Shortness of breath: Secondary | ICD-10-CM | POA: Diagnosis not present

## 2022-06-25 DIAGNOSIS — K5900 Constipation, unspecified: Secondary | ICD-10-CM | POA: Diagnosis not present

## 2022-06-25 DIAGNOSIS — Z8249 Family history of ischemic heart disease and other diseases of the circulatory system: Secondary | ICD-10-CM | POA: Diagnosis not present

## 2022-06-25 DIAGNOSIS — I2510 Atherosclerotic heart disease of native coronary artery without angina pectoris: Secondary | ICD-10-CM | POA: Diagnosis not present

## 2022-06-25 DIAGNOSIS — I10 Essential (primary) hypertension: Secondary | ICD-10-CM | POA: Diagnosis present

## 2022-06-25 DIAGNOSIS — N184 Chronic kidney disease, stage 4 (severe): Secondary | ICD-10-CM | POA: Diagnosis not present

## 2022-06-25 DIAGNOSIS — Z79899 Other long term (current) drug therapy: Secondary | ICD-10-CM | POA: Diagnosis not present

## 2022-06-25 DIAGNOSIS — D500 Iron deficiency anemia secondary to blood loss (chronic): Secondary | ICD-10-CM | POA: Diagnosis not present

## 2022-06-25 DIAGNOSIS — Z888 Allergy status to other drugs, medicaments and biological substances status: Secondary | ICD-10-CM

## 2022-06-25 DIAGNOSIS — I252 Old myocardial infarction: Secondary | ICD-10-CM

## 2022-06-25 DIAGNOSIS — Z880 Allergy status to penicillin: Secondary | ICD-10-CM

## 2022-06-25 DIAGNOSIS — R0689 Other abnormalities of breathing: Secondary | ICD-10-CM | POA: Diagnosis not present

## 2022-06-25 DIAGNOSIS — Z885 Allergy status to narcotic agent status: Secondary | ICD-10-CM | POA: Diagnosis not present

## 2022-06-25 DIAGNOSIS — I959 Hypotension, unspecified: Secondary | ICD-10-CM | POA: Diagnosis not present

## 2022-06-25 DIAGNOSIS — D509 Iron deficiency anemia, unspecified: Secondary | ICD-10-CM | POA: Diagnosis not present

## 2022-06-25 DIAGNOSIS — Z8719 Personal history of other diseases of the digestive system: Secondary | ICD-10-CM | POA: Diagnosis not present

## 2022-06-25 DIAGNOSIS — K922 Gastrointestinal hemorrhage, unspecified: Principal | ICD-10-CM

## 2022-06-25 DIAGNOSIS — D132 Benign neoplasm of duodenum: Secondary | ICD-10-CM | POA: Diagnosis not present

## 2022-06-25 DIAGNOSIS — K635 Polyp of colon: Secondary | ICD-10-CM | POA: Diagnosis not present

## 2022-06-25 DIAGNOSIS — E785 Hyperlipidemia, unspecified: Secondary | ICD-10-CM | POA: Diagnosis present

## 2022-06-25 DIAGNOSIS — Z7985 Long-term (current) use of injectable non-insulin antidiabetic drugs: Secondary | ICD-10-CM

## 2022-06-25 LAB — BASIC METABOLIC PANEL
Anion gap: 10 (ref 5–15)
BUN: 38 mg/dL — ABNORMAL HIGH (ref 8–23)
CO2: 22 mmol/L (ref 22–32)
Calcium: 8.4 mg/dL — ABNORMAL LOW (ref 8.9–10.3)
Chloride: 105 mmol/L (ref 98–111)
Creatinine, Ser: 1.83 mg/dL — ABNORMAL HIGH (ref 0.44–1.00)
GFR, Estimated: 27 mL/min — ABNORMAL LOW (ref >60)
Glucose, Bld: 115 mg/dL — ABNORMAL HIGH (ref 70–99)
Potassium: 3.5 mmol/L (ref 3.5–5.1)
Sodium: 137 mmol/L (ref 135–145)

## 2022-06-25 LAB — FOLATE: Folate: 37 ng/mL (ref >5.9)

## 2022-06-25 LAB — IRON AND TIBC
Iron: 47 ug/dL (ref 28–170)
Saturation Ratios: 14 % (ref 10.4–31.8)
TIBC: 326 ug/dL (ref 250–450)
UIBC: 279 ug/dL

## 2022-06-25 LAB — CBC
HCT: 24.5 % — ABNORMAL LOW (ref 36.0–46.0)
Hemoglobin: 6.7 g/dL — ABNORMAL LOW (ref 12.0–15.0)
MCH: 25.9 pg — ABNORMAL LOW (ref 26.0–34.0)
MCHC: 27.3 g/dL — ABNORMAL LOW (ref 30.0–36.0)
MCV: 94.6 fL (ref 80.0–100.0)
Platelets: 272 10*3/uL (ref 150–400)
RBC: 2.59 MIL/uL — ABNORMAL LOW (ref 3.87–5.11)
RDW: 21.1 % — ABNORMAL HIGH (ref 11.5–15.5)
WBC: 6.6 10*3/uL (ref 4.0–10.5)
nRBC: 0 % (ref 0.0–0.2)

## 2022-06-25 LAB — CBG MONITORING, ED
Glucose-Capillary: 118 mg/dL — ABNORMAL HIGH (ref 70–99)
Glucose-Capillary: 118 mg/dL — ABNORMAL HIGH (ref 70–99)

## 2022-06-25 LAB — TROPONIN I (HIGH SENSITIVITY)
Troponin I (High Sensitivity): 12 ng/L (ref <18)
Troponin I (High Sensitivity): 12 ng/L (ref <18)

## 2022-06-25 LAB — PREPARE RBC (CROSSMATCH)

## 2022-06-25 LAB — HEMOGLOBIN AND HEMATOCRIT, BLOOD
HCT: 26.6 % — ABNORMAL LOW (ref 36.0–46.0)
Hemoglobin: 7.5 g/dL — ABNORMAL LOW (ref 12.0–15.0)

## 2022-06-25 LAB — FERRITIN: Ferritin: 60 ng/mL (ref 11–307)

## 2022-06-25 LAB — LIPASE, BLOOD: Lipase: 47 U/L (ref 11–51)

## 2022-06-25 MED ORDER — SODIUM CHLORIDE 0.9 % IV SOLN
10.0000 mL/h | Freq: Once | INTRAVENOUS | Status: AC
  Administered 2022-06-25: 10 mL/h via INTRAVENOUS

## 2022-06-25 MED ORDER — ONDANSETRON HCL 4 MG/2ML IJ SOLN
4.0000 mg | Freq: Four times a day (QID) | INTRAMUSCULAR | Status: DC | PRN
  Administered 2022-06-25 – 2022-06-29 (×2): 4 mg via INTRAVENOUS
  Filled 2022-06-25 (×2): qty 2

## 2022-06-25 MED ORDER — PANTOPRAZOLE SODIUM 40 MG IV SOLR
40.0000 mg | Freq: Two times a day (BID) | INTRAVENOUS | Status: DC
  Administered 2022-06-25: 40 mg via INTRAVENOUS
  Filled 2022-06-25: qty 10

## 2022-06-25 MED ORDER — INSULIN ASPART 100 UNIT/ML IJ SOLN
0.0000 [IU] | Freq: Three times a day (TID) | INTRAMUSCULAR | Status: DC
  Administered 2022-06-27: 3 [IU] via SUBCUTANEOUS
  Administered 2022-06-28 – 2022-06-29 (×3): 2 [IU] via SUBCUTANEOUS
  Administered 2022-07-01: 3 [IU] via SUBCUTANEOUS
  Filled 2022-06-25 (×7): qty 1

## 2022-06-25 MED ORDER — PEG 3350-KCL-NA BICARB-NACL 420 G PO SOLR
4000.0000 mL | Freq: Once | ORAL | Status: AC
  Administered 2022-06-25: 4000 mL via ORAL
  Filled 2022-06-25: qty 4000

## 2022-06-25 MED ORDER — SODIUM CHLORIDE 0.9% FLUSH
3.0000 mL | Freq: Two times a day (BID) | INTRAVENOUS | Status: DC
  Administered 2022-06-25 – 2022-07-01 (×11): 3 mL via INTRAVENOUS

## 2022-06-25 MED ORDER — SODIUM CHLORIDE 0.9 % IV SOLN: INTRAVENOUS | Status: DC

## 2022-06-25 MED ORDER — PANTOPRAZOLE SODIUM 40 MG IV SOLR
40.0000 mg | Freq: Once | INTRAVENOUS | Status: AC
  Administered 2022-06-25: 40 mg via INTRAVENOUS
  Filled 2022-06-25: qty 10

## 2022-06-25 MED ORDER — SODIUM CHLORIDE 0.9% FLUSH: 3.0000 mL | INTRAVENOUS | Status: DC | PRN

## 2022-06-25 MED ORDER — FUROSEMIDE 10 MG/ML IJ SOLN
40.0000 mg | Freq: Once | INTRAMUSCULAR | Status: AC
  Administered 2022-06-25: 40 mg via INTRAVENOUS
  Filled 2022-06-25: qty 4

## 2022-06-25 MED ORDER — ONDANSETRON HCL 4 MG PO TABS: 4.0000 mg | ORAL_TABLET | Freq: Four times a day (QID) | ORAL | Status: DC | PRN

## 2022-06-25 MED ORDER — PANTOPRAZOLE SODIUM 40 MG IV SOLR: 40.0000 mg | INTRAVENOUS | Status: DC

## 2022-06-25 MED ORDER — SODIUM CHLORIDE 0.9 % IV SOLN: 250.0000 mL | INTRAVENOUS | Status: DC | PRN

## 2022-06-25 NOTE — Assessment & Plan Note (Signed)
Hold aspirin due to acute blood loss anemia Hold metoprolol for now since patient is normotensive

## 2022-06-25 NOTE — Assessment & Plan Note (Signed)
Initially held antihypertensive medications as patient was normotensive, blood pressure has been trending up, so we will restart home meds

## 2022-06-25 NOTE — H&P (Signed)
History and Physical    Patient: Melissa Christian ZLD:[id number] DOB: 07-Aug-1940 DOA: 06/25/2022 DOS: the patient was seen and examined on 06/25/2022 PCP: Rusty Aus, MD  Patient coming from: Home  Chief Complaint:  Chief Complaint  Patient presents with   Shortness of Breath   HPI: Melissa Christian is a 81 y.o. female with medical history significant for coronary artery disease, diabetes mellitus with complications of stage IV chronic kidney disease, hypertension, iron deficiency anemia who presents to the emergency room via EMS for evaluation of worsening shortness of breath. Patient states that she has had shortness of breath with exertion for several months but now is short of breath at rest. She also complains of passing dark tarry stools for about 2 months.  She initially declined GI workup for evaluation of her iron deficiency anemia but was recently seen by GI and is now interested in proceeding with luminal evaluation.  Per GI notes, patient is willing to have an EGD done and not ready at this time for the colonoscopy prep. She has received several iron infusions with the last one being 12/22 for iron deficiency anemia. She is unable to carry out activities of daily living due to shortness of breath.  She denies feeling dizzy or lightheaded and has not had any falls.   She denies having any abdominal pain and denies NSAID use.  She denies having any leg swelling, no headache, no fever, no chills, no cough, no urinary symptoms, no blurred vision and no focal deficit. Abnormal labs show a hemoglobin of 6.7 compared to 8.0 about 11 days ago. She has been ordered 2 units of packed RBC in the ER will be admitted to the hospital for further evaluation   Review of Systems: As mentioned in the history of present illness. All other systems reviewed and are negative. Past Medical History:  Diagnosis Date   Anemia    CAD (coronary artery disease)    a. NSTEMI 09/2015: cardiac cath: LM no  obs dz, mLAD 95% s/p PCI/DES 0%, LCx no obs, RCA no obs, LVEF 55-65%   Chronic kidney disease (CKD), stage III (moderate) (HCC)    Diabetes mellitus with complication (HCC)    HLD (hyperlipidemia)    Hypertension    Low blood potassium    NSTEMI (non-ST elevated myocardial infarction) (HCC)    PONV (postoperative nausea and vomiting)    Valvular heart disease    a. 09/2015: EF 55-60%, challenging images unable to exclude mild mid-distal anterior to apical HK, nl LV dias fxn, mild AI/MR, normal size of left atrium, RV systolic function normal, PASP normal     Past Surgical History:  Procedure Laterality Date   BACK SURGERY     CARDIAC CATHETERIZATION N/A 09/23/2015   Procedure: Right and Left Heart Cath;  Surgeon: Minna Merritts, MD;  Location: [place] [facility];  Service: Cardiovascular;  Laterality: N/A;   CARDIAC CATHETERIZATION N/A 09/23/2015   Procedure: Coronary Stent Intervention;  Surgeon: Yolonda Kida, MD;  Location: [place] [facility];  Service: Cardiovascular;  Laterality: N/A;   CATARACT EXTRACTION W/PHACO Left 04/26/2017   Procedure: CATARACT EXTRACTION PHACO AND INTRAOCULAR LENS PLACEMENT ([place]) LEFT DIABETIC;  Surgeon: Leandrew Koyanagi, MD;  Location: [facility];  Service: Ophthalmology;  Laterality: Left;  diabetic-oral meds   CATARACT EXTRACTION W/PHACO Right 05/31/2017   Procedure: CATARACT EXTRACTION PHACO AND INTRAOCULAR LENS PLACEMENT ([place]) RIGHT DIABETIC;  Surgeon: Leandrew Koyanagi, MD;  Location: [place];  Service:  Ophthalmology;  Laterality: Right;   CHOLECYSTECTOMY N/A 06/16/2016   Procedure: LAPAROSCOPIC CHOLECYSTECTOMY WITH INTRAOPERATIVE CHOLANGIOGRAM;  Surgeon: Leonie Green, MD;  Location: [facility];  Service: General;  Laterality: N/A;   CORONARY STENT PLACEMENT     JOINT REPLACEMENT     LUMBAR FUSION     TOTAL HIP ARTHROPLASTY     Social History:  reports that she has never smoked. She has never used smokeless  tobacco. She reports that she does not drink alcohol and does not use drugs.  Allergies  Allergen Reactions   Ferrous Sulfate Itching   Keflex [Cephalexin] Itching   Percocet [Oxycodone-Acetaminophen] Itching   Vicodin [Hydrocodone-Acetaminophen] Itching   Ace Inhibitors Rash    elevated potassium at higher doses   Penicillins Rash    Has patient had a PCN reaction causing immediate rash, facial/tongue/throat swelling, SOB or lightheadedness with hypotension: No Has patient had a PCN reaction causing severe rash involving mucus membranes or skin necrosis: No Has patient had a PCN reaction that required hospitalization No Has patient had a PCN reaction occurring within the last 10 years: No If all of the above answers are "NO", then may proceed with Cephalosporin use.     Family History  Problem Relation Age of Onset   Diabetes Mother    Hypertension Mother    Heart disease Father    Cancer Maternal Grandfather     Prior to Admission medications   Medication Sig Start Date End Date Taking? Authorizing Provider  aspirin EC 81 MG tablet Take 81 mg by mouth daily.    [provider]  B Complex-C-Folic Acid (RENA-VITE RX) 1 MG TABS Take 1 tablet by mouth daily. 08/17/18   [provider]  Cholecalciferol (VITAMIN D3) 2000 units TABS Take 2,000 Units by mouth daily.    [provider]  cyanocobalamin (VITAMIN B12) 1000 MCG/ML injection Inject 1,000 mcg into the muscle every 14 (fourteen) days. 11/23/21   [provider]  doxazosin (CARDURA) 2 MG tablet Take 1 tablet (2 mg total) by mouth 2 (two) times daily. 02/04/22   Minna Merritts, MD  furosemide (LASIX) 40 MG tablet Take 1 tablet (40 mg total) by mouth daily. 02/04/22   Minna Merritts, MD  IV Sets-Tubing (INFUSION SET) MISC by Does not apply route. Iron infusion weekly at facility    [provider]  metoprolol succinate (TOPROL-XL) 100 MG 24 hr tablet Take 1 tablet in the morning and 1/2  tablet in the evening. 02/04/22   Minna Merritts, MD  pioglitazone (ACTOS) 30 MG tablet Take 30 mg by mouth daily.  05/07/18   [provider]  rosuvastatin (CRESTOR) 40 MG tablet Take 1 tablet (40 mg total) by mouth daily. 02/04/22   Minna Merritts, MD  triamterene-hydrochlorothiazide (DYAZIDE) 37.5-25 MG capsule Take 1 each (1 capsule total) by mouth every other day. 02/04/22   Minna Merritts, MD  TRULICITY 1.5 GT/3.6IW SOPN INJECT 1.5 MG SUBCUTANEOUSLY EVERY 7 (SEVEN) DAYS 07/02/18   [provider]    Physical Exam: Vitals:   06/25/22 1032 06/25/22 1046 06/25/22 1047 06/25/22 1055  BP: (!) 143/54   116/71  Pulse: 76   73  Resp: 20   20  Temp: (!) 97.3 F (36.3 C)   (!) 97.4 F (36.3 C)  TempSrc: Oral   Oral  SpO2: 100%   99%  Weight:  113.4 kg 113.4 kg   Height:   '5\' 4"'$  (1.626 m)  Physical Exam Vitals and nursing note reviewed.  Constitutional:      Appearance: She is well-developed.  HENT:     Head: Normocephalic and atraumatic.     Mouth/Throat:     Mouth: Mucous membranes are moist.  Eyes:     Comments: Pale conjunctiva  Cardiovascular:     Rate and Rhythm: Normal rate and regular rhythm.  Pulmonary:     Effort: Pulmonary effort is normal.     Breath sounds: Normal breath sounds.  Abdominal:     General: Bowel sounds are normal.     Palpations: Abdomen is soft.     Comments: Central adiposity  Musculoskeletal:        General: Normal range of motion.     Cervical back: Normal range of motion and neck supple.  Skin:    General: Skin is warm and dry.  Neurological:     General: No focal deficit present.     Mental Status: She is alert.  Psychiatric:        Mood and Affect: Mood normal.        Behavior: Behavior normal.     Data Reviewed: Relevant notes from primary care and specialist visits, past discharge summaries as available in EHR, including Care Everywhere. Prior diagnostic testing as pertinent to current admission  diagnoses Updated medications and problem lists for reconciliation ED course, including vitals, labs, imaging, treatment and response to treatment Triage notes, nursing and pharmacy notes and ED provider's notes Notable results as noted in HPI Labs reviewed.  Sodium 137, potassium 3.5, chloride 105, bicarb 22, glucose 115, BUN 38, creatinine 1.83, calcium 8.4, white count 6.6, hemoglobin 6.7, hematocrit 24.5, platelet count 272 Chest x-ray reviewed by me shows No acute cardiopulmonary abnormalities.  Twelve-lead EKG reviewed by me shows sinus rhythm with first-degree AV block.  Right bundle branch block. There are no new results to review at this time.  Assessment and Plan: * ABLA (acute blood loss anemia) Patient presents to the ER for evaluation of worsening shortness of breath and weakness and found to have a hemoglobin of 6.7g/dl compared to a baseline of 8.0 g/dl She has been passing dark tarry stools for several months and initially declined GI evaluation. She has been receiving iron infusion at the cancer center. Recently seen by GI with plans for an upper endoscopy Will transfuse 2 units of packed RBC Check serial H&H Place patient on Protonix 40 mg IV every 12   Symptomatic anemia Treatment as outlined in 1  CKD stage 4 due to type 2 diabetes mellitus ([place]) Patient has type 2 diabetes mellitus with complications of stage IV chronic kidney disease Hold Actos and Trulicity Glycemic control with sliding scale insulin Monitor renal function closely  CAD (coronary artery disease) Hold aspirin due to acute blood loss anemia Hold metoprolol for now since patient is normotensive  Morbid obesity due to excess calories (HCC) BMI 13.0 Complicates overall prognosis and care Lifestyle modification and exercise has been discussed with patient in detail  HTN (hypertension) Hold all antihypertensive medications for now since patient is normotensive      Advance Care Planning:    Code Status: Full Code   Consults: Gastroenterology  Family Communication: Greater than 50% of time spent discussing patient's condition and plan of care with her at the bedside.  All questions and concerns have been addressed.  She verbalizes understanding and agrees with the plan.  CODE STATUS was discussed and she wishes to be full code  Severity  of Illness: The appropriate patient status for this patient is INPATIENT. Inpatient status is judged to be reasonable and necessary in order to provide the required intensity of service to ensure the patient's safety. The patient's presenting symptoms, physical exam findings, and initial radiographic and laboratory data in the context of their chronic comorbidities is felt to place them at high risk for further clinical deterioration. Furthermore, it is not anticipated that the patient will be medically stable for discharge from the hospital within 2 midnights of admission.   * I certify that at the point of admission it is my clinical judgment that the patient will require inpatient hospital care spanning beyond 2 midnights from the point of admission due to high intensity of service, high risk for further deterioration and high frequency of surveillance required.*  Author: Collier Bullock, MD 06/25/2022 11:26 AM  For on call review [email or web address].

## 2022-06-25 NOTE — ED Notes (Signed)
RN contacted pharmacy for Prep

## 2022-06-25 NOTE — Consult Note (Signed)
Jonathon Bellows , MD [address], [place], [place], [place], [address] [address], [place], [place], [place], [address] Phone: [phone]  Fax: [phone]  Consultation  Referring Provider:     Dr Francine Graven Primary Care Physician:  Rusty Aus, MD Primary Gastroenterologist:  [facility]         Reason for Consultation:     Melena   Date of Admission:  06/25/2022 Date of Consultation:  06/25/2022         HPI:   Melissa Christian is a 81 y.o. female was seen on 06/21/2022 by [facility] clinic gastroenterology for iron deficiency anemia.  She has CKD, CAD, diabetes.  No prior EGD or colonoscopy.  Receiving IV iron with Dr. Tasia Catchings.  Previously declined GI workup, it was discussed at the office visit to have a colonoscopy and EGD but she decided to only have an EGD at this point of time.  Hemoglobin was 14 in 2021 and in December 2023 was 8 g with a ferritin of 12.  Had been on NSAIDs.  She presented to the emergency room with shortness of breath and an admission hemoglobin of 6.7 g down from 8 g 11 days back.  She has been given a blood transfusion.  And I been consulted for further evaluation.  She says she has been having black colored stools for the past few weeks, no nose bleeds, nsaid use, abdominal pain,nausea or vomiting,  Feels short of breath on exertion and when she lays flat.  Past Medical History:  Diagnosis Date   Anemia    CAD (coronary artery disease)    a. NSTEMI 09/2015: cardiac cath: LM no obs dz, mLAD 95% s/p PCI/DES 0%, LCx no obs, RCA no obs, LVEF 55-65%   Chronic kidney disease (CKD), stage III (moderate) (HCC)    Diabetes mellitus with complication (HCC)    HLD (hyperlipidemia)    Hypertension    Low blood potassium    NSTEMI (non-ST elevated myocardial infarction) (HCC)    PONV (postoperative nausea and vomiting)    Valvular heart disease    a. 09/2015: EF 55-60%, challenging images unable to exclude mild mid-distal anterior to apical HK, nl LV dias fxn, mild AI/MR,  normal size of left atrium, RV systolic function normal, PASP normal      Past Surgical History:  Procedure Laterality Date   BACK SURGERY     CARDIAC CATHETERIZATION N/A 09/23/2015   Procedure: Right and Left Heart Cath;  Surgeon: Minna Merritts, MD;  Location: [place] [facility];  Service: Cardiovascular;  Laterality: N/A;   CARDIAC CATHETERIZATION N/A 09/23/2015   Procedure: Coronary Stent Intervention;  Surgeon: Yolonda Kida, MD;  Location: [place] [facility];  Service: Cardiovascular;  Laterality: N/A;   CATARACT EXTRACTION W/PHACO Left 04/26/2017   Procedure: CATARACT EXTRACTION PHACO AND INTRAOCULAR LENS PLACEMENT ([place]) LEFT DIABETIC;  Surgeon: Leandrew Koyanagi, MD;  Location: [place];  Service: Ophthalmology;  Laterality: Left;  diabetic-oral meds   CATARACT EXTRACTION W/PHACO Right 05/31/2017   Procedure: CATARACT EXTRACTION PHACO AND INTRAOCULAR LENS PLACEMENT ([place]) RIGHT DIABETIC;  Surgeon: Leandrew Koyanagi, MD;  Location: [place];  Service: Ophthalmology;  Laterality: Right;   CHOLECYSTECTOMY N/A 06/16/2016   Procedure: LAPAROSCOPIC CHOLECYSTECTOMY WITH INTRAOPERATIVE CHOLANGIOGRAM;  Surgeon: Leonie Green, MD;  Location: [facility];  Service: General;  Laterality: N/A;   CORONARY STENT PLACEMENT     JOINT REPLACEMENT     LUMBAR FUSION  TOTAL HIP ARTHROPLASTY      Prior to Admission medications   Medication Sig Start Date End Date Taking? Authorizing Provider  aspirin EC 81 MG tablet Take 81 mg by mouth daily.    [provider]  B Complex-C-Folic Acid (RENA-VITE RX) 1 MG TABS Take 1 tablet by mouth daily. 08/17/18   [provider]  Cholecalciferol (VITAMIN D3) 2000 units TABS Take 2,000 Units by mouth daily.    [provider]  cyanocobalamin (VITAMIN B12) 1000 MCG/ML injection Inject 1,000 mcg into the muscle every 14 (fourteen) days. 11/23/21   [provider]  doxazosin (CARDURA) 2 MG tablet  Take 1 tablet (2 mg total) by mouth 2 (two) times daily. 02/04/22   Minna Merritts, MD  furosemide (LASIX) 40 MG tablet Take 1 tablet (40 mg total) by mouth daily. 02/04/22   Minna Merritts, MD  IV Sets-Tubing (INFUSION SET) MISC by Does not apply route. Iron infusion weekly at facility    [provider]  metoprolol succinate (TOPROL-XL) 100 MG 24 hr tablet Take 1 tablet in the morning and 1/2 tablet in the evening. 02/04/22   Minna Merritts, MD  pioglitazone (ACTOS) 30 MG tablet Take 30 mg by mouth daily.  05/07/18   [provider]  rosuvastatin (CRESTOR) 40 MG tablet Take 1 tablet (40 mg total) by mouth daily. 02/04/22   Minna Merritts, MD  triamterene-hydrochlorothiazide (DYAZIDE) 37.5-25 MG capsule Take 1 each (1 capsule total) by mouth every other day. 02/04/22   Minna Merritts, MD  TRULICITY 1.5 WC/3.7SE SOPN INJECT 1.5 MG SUBCUTANEOUSLY EVERY 7 (SEVEN) DAYS 07/02/18   [provider]    Family History  Problem Relation Age of Onset   Diabetes Mother    Hypertension Mother    Heart disease Father    Cancer Maternal Grandfather      Social History   Tobacco Use   Smoking status: Never   Smokeless tobacco: Never  Vaping Use   Vaping Use: Never used  Substance Use Topics   Alcohol use: No    Alcohol/week: 0.0 standard drinks of alcohol   Drug use: No    Allergies as of 06/25/2022 - Review Complete 06/25/2022  Allergen Reaction Noted   Ferrous sulfate Itching 09/22/2015   Keflex [cephalexin] Itching 09/22/2015   Percocet [oxycodone-acetaminophen] Itching 09/22/2015   Vicodin [hydrocodone-acetaminophen] Itching 09/22/2015   Ace inhibitors Rash 10/12/2015   Penicillins Rash 10/12/2015    Review of Systems:    All systems reviewed and negative except where noted in HPI.   Physical Exam:  Vital signs in last 24 hours: Temp:  [97.3 F (36.3 C)-98.6 F (37 C)] 97.4 F (36.3 C) (12/23 1055) Pulse Rate:  [64-76] 73 (12/23 1055) Resp:   [18-20] 20 (12/23 1055) BP: (101-143)/(35-71) 116/71 (12/23 1055) SpO2:  [95 %-100 %] 99 % (12/23 1055) Weight:  [113.4 kg] 113.4 kg (12/23 1047)   General:   Pleasant, cooperative in NAD Head:  Normocephalic and atraumatic. Eyes:   No icterus.   Conjunctiva pink. PERRLA. Ears:  Normal auditory acuity. Neck:  Supple; no masses or thyroidomegaly Lungs: Respirations even and unlabored. Lungs clear to auscultation bilaterally.   No wheezes, crackles, or rhonchi.  Heart:  Regular rate and rhythm;  Without murmur, clicks, rubs or gallops Abdomen:  Soft, nondistended, nontender. Normal bowel sounds. No appreciable masses or hepatomegaly.  No rebound or guarding.  Neurologic:  Alert and oriented x3;  grossly normal neurologically. Skin:  Intact without significant lesions or rashes. Cervical Nodes:  No significant cervical adenopathy. Psych:  Alert and cooperative. Normal affect.  LAB RESULTS: Recent Labs    06/25/22 0723  WBC 6.6  HGB 6.7*  HCT 24.5*  PLT 272   BMET Recent Labs    06/25/22 0723  NA 137  K 3.5  CL 105  CO2 22  GLUCOSE 115*  BUN 38*  CREATININE 1.83*  CALCIUM 8.4*   LFT No results for input(s): "PROT", "ALBUMIN", "AST", "ALT", "ALKPHOS", "BILITOT", "BILIDIR", "IBILI" in the last 72 hours. PT/INR No results for input(s): "LABPROT", "INR" in the last 72 hours.  STUDIES: DG Chest 2 View  Result Date: 06/25/2022 CLINICAL DATA:  Shortness of breath. EXAM: CHEST - 2 VIEW COMPARISON:  06/11/2020 FINDINGS: Stable mild cardiac enlargement. Aortic atherosclerosis. No pleural effusion or interstitial edema. No airspace opacities. Visualized osseous structures are unremarkable. IMPRESSION: No acute cardiopulmonary abnormalities. Electronically Signed   By: Kerby Moors M.D.   On: 06/25/2022 08:24      Impression / Plan:   Melissa Christian is a 81 y.o. y/o female with history of CKD, CAD on aspirin with no prior EGD or colonoscopy seen by hematology as an outpatient  and [facility] clinic gastroenterology with a plan for an upper endoscopy as an outpatient and colonoscopy also offered patient declined admitted with a further drop of hemoglobin from 8 g to 6.7 g associated with shortness of breath and melena.  S/p transfusion of 2 units of PRBCs.  Plan 1.  Monitor CBC and transfuse as needed 2.  Check ferritin B12 if ferritin is low replace with IV iron 3.  IV PPI 4.  EGD+colonoscopy tomorrow   I have discussed alternative options, risks & benefits,  which include, but are not limited to, bleeding, infection, perforation,respiratory complication & drug reaction.  The patient agrees with this plan & written consent will be obtained.     Thank you for involving me in the care of this patient.      LOS: 0 days   Jonathon Bellows, MD  06/25/2022, 12:15 PM

## 2022-06-25 NOTE — Assessment & Plan Note (Addendum)
Status post 3 units packed red blood cells total.  I do not think that she had worsening bleeding when she first came in but rather this is more from hemoconcentration initially due to blood loss with more accurate readings on 12/24 morning.  Hemoglobin slightly improved from previous day.  Patient status post unremarkable EGD and colonoscopy.  Status post capsule endoscopy, but capsule has not yet passed/patient has not yet had another bowel movement.  Repeat EGD done 12/28 with placement of capsule in small bowel.

## 2022-06-25 NOTE — Assessment & Plan Note (Signed)
Patient meets criteria with BMI greater than 40

## 2022-06-25 NOTE — ED Provider Notes (Signed)
[facility] Provider Note   Event Date/Time   First MD Initiated Contact with Patient 06/25/22 [id number]     (approximate) History  Shortness of Breath  HPI Melissa Christian is a 81 y.o. female with past medical history of CAD and type 2 diabetes who presents for worsening shortness of breath in the setting of 2 weeks of dark tarry stools.  Patient states that she is currently being worked up by GI and was supposed to have a EGD on the 28th but was unable to make that appointment due to significant shortness of breath and dyspnea on exertion.  Patient states that the symptoms have happened in the past with GI bleeding and she has been needed blood transfusions as well as is currently receiving iron transfusions for symptomatic anemia. ROS: Patient currently denies any vision changes, tinnitus, difficulty speaking, facial droop, sore throat, chest pain, abdominal pain, nausea/vomiting/diarrhea, dysuria, or weakness/numbness/paresthesias in any extremity   Physical Exam  Triage Vital Signs: ED Triage Vitals  Enc Vitals Group     BP 06/25/22 0716 123/70     Pulse Rate 06/25/22 0708 71     Resp 06/25/22 0708 18     Temp 06/25/22 0708 98.6 F (37 C)     Temp src --      SpO2 06/25/22 0708 98 %     Weight --      Height --      Head Circumference --      Peak Flow --      Pain Score 06/25/22 0709 0     Pain Loc --      Pain Edu? --      Excl. in [place]? --    Most recent vital signs: Vitals:   06/25/22 1032 06/25/22 1055  BP: (!) 143/54 116/71  Pulse: 76 73  Resp: 20 20  Temp: (!) 97.3 F (36.3 C) (!) 97.4 F (36.3 C)  SpO2: 100% 99%   General: Awake, oriented x4. CV:  Good peripheral perfusion.  Resp:  Normal effort.  Abd:  No distention.  Other:  Elderly Caucasian overweight female laying in bed in no acute distress ED Results / Procedures / Treatments  Labs (all labs ordered are listed, but only abnormal results are displayed) Labs Reviewed  BASIC  METABOLIC PANEL - Abnormal; Notable for the following components:      Result Value   Glucose, Bld 115 (*)    BUN 38 (*)    Creatinine, Ser 1.83 (*)    Calcium 8.4 (*)    GFR, Estimated 27 (*)    All other components within normal limits  CBC - Abnormal; Notable for the following components:   RBC 2.59 (*)    Hemoglobin 6.7 (*)    HCT 24.5 (*)    MCH 25.9 (*)    MCHC 27.3 (*)    RDW 21.1 (*)    All other components within normal limits  LIPASE, BLOOD  IRON AND TIBC  TYPE AND SCREEN  PREPARE RBC (CROSSMATCH)  TROPONIN I (HIGH SENSITIVITY)  TROPONIN I (HIGH SENSITIVITY)   EKG ED ECG REPORT I, Naaman Plummer, the attending physician, personally viewed and interpreted this ECG. Date: 06/25/2022 EKG Time: 0715 Rate: 70 Rhythm: normal sinus rhythm QRS Axis: normal Intervals: normal ST/T Wave abnormalities: normal Narrative Interpretation: no evidence of acute ischemia RADIOLOGY ED MD interpretation: 2 view chest x-ray interpreted by me shows no evidence of acute abnormalities including no pneumonia, pneumothorax, or  widened mediastinum -Agree with radiology assessment Official radiology report(s): DG Chest 2 View  Result Date: 06/25/2022 CLINICAL DATA:  Shortness of breath. EXAM: CHEST - 2 VIEW COMPARISON:  06/11/2020 FINDINGS: Stable mild cardiac enlargement. Aortic atherosclerosis. No pleural effusion or interstitial edema. No airspace opacities. Visualized osseous structures are unremarkable. IMPRESSION: No acute cardiopulmonary abnormalities. Electronically Signed   By: Kerby Moors M.D.   On: 06/25/2022 08:24   PROCEDURES: Critical Care performed: Yes, see critical care procedure note(s) .1-3 Lead EKG Interpretation  Performed by: Naaman Plummer, MD Authorized by: Naaman Plummer, MD     Interpretation: normal     ECG rate:  71   ECG rate assessment: normal     Rhythm: sinus rhythm     Ectopy: none     Conduction: normal   CRITICAL CARE Performed by: Naaman Plummer  Total critical care time: 31 minutes  Critical care time was exclusive of separately billable procedures and treating other patients.  Critical care was necessary to treat or prevent imminent or life-threatening deterioration.  Critical care was time spent personally by me on the following activities: development of treatment plan with patient and/or surrogate as well as nursing, discussions with consultants, evaluation of patient's response to treatment, examination of patient, obtaining history from patient or surrogate, ordering and performing treatments and interventions, ordering and review of laboratory studies, ordering and review of radiographic studies, pulse oximetry and re-evaluation of patient's condition.  MEDICATIONS ORDERED IN ED: Medications  pantoprazole (PROTONIX) injection 40 mg (has no administration in time range)  0.9 %  sodium chloride infusion (10 mL/hr Intravenous New Bag/Given 06/25/22 1044)   IMPRESSION / MDM / ASSESSMENT AND PLAN / ED COURSE  I reviewed the triage vital signs and the nursing notes.                             The patient is on the cardiac monitor to evaluate for evidence of arrhythmia and/or significant heart rate changes. Patient's presentation is most consistent with acute presentation with potential threat to life or bodily function. + black stool per rectum Given history and exam patients presentation most consistent with upper GI bleed possibly secondary to peptic ulcer disease or variceal bleeding. I have low suspicion for aortoenteric fistula, ENT bleeding mimic, [place], Pulmonary bleeding mimic.  Workup: CBC, BMP, LFTs, Lipase, PT/INR, Type and Screen  Interventions: Analgesia and antiemetic medications PRN Protonix '40mg'$  IVP PRBC transfusion  Findings: Hb: 6.7  Disposition: Admit for close monitoring.   FINAL CLINICAL IMPRESSION(S) / ED DIAGNOSES   Final diagnoses:  Upper GI bleeding  Symptomatic anemia    Rx / DC Orders   ED Discharge Orders     None      Note:  This document was prepared using Dragon voice recognition software and may include unintentional dictation errors.   Naaman Plummer, MD 06/25/22 [phone]

## 2022-06-25 NOTE — Assessment & Plan Note (Addendum)
Patient has type 2 diabetes mellitus with complications of stage IV chronic kidney disease.  Renal function at baseline Hold Actos and Trulicity Glycemic control with sliding scale insulin Monitor renal function closely

## 2022-06-25 NOTE — ED Triage Notes (Signed)
Pt to ED via [facility] from home. Pt reports ongoing SOB that has gotten worse. Pt reports hx CHF and is on fluid pills. Pt has been having ongoing dark tarry stools for 2 months and per pt has had several infusions.

## 2022-06-25 NOTE — ED Notes (Signed)
First Nurse Note: Pt arrives via EMS from home with complaints of SOB. Pt placed on 3L [place] by EMS for comfort. Of note, pt gets iron infusions for anemia and has been having dark stools.  VS with EMS 132/57 70s HR [id number]

## 2022-06-25 NOTE — Assessment & Plan Note (Signed)
Treatment as outlined in 1 

## 2022-06-26 ENCOUNTER — Inpatient Hospital Stay: Payer: Medicare HMO | Admitting: Anesthesiology

## 2022-06-26 ENCOUNTER — Encounter
Admission: EM | Disposition: A | Discharge status: Home / Self Care | Payer: Self-pay | Source: Home / Self Care | Attending: Internal Medicine

## 2022-06-26 ENCOUNTER — Encounter: Payer: Self-pay | Admitting: Internal Medicine

## 2022-06-26 DIAGNOSIS — D649 Anemia, unspecified: Secondary | ICD-10-CM

## 2022-06-26 DIAGNOSIS — I10 Essential (primary) hypertension: Secondary | ICD-10-CM

## 2022-06-26 DIAGNOSIS — E1122 Type 2 diabetes mellitus with diabetic chronic kidney disease: Secondary | ICD-10-CM | POA: Diagnosis not present

## 2022-06-26 DIAGNOSIS — K921 Melena: Secondary | ICD-10-CM | POA: Diagnosis not present

## 2022-06-26 DIAGNOSIS — K922 Gastrointestinal hemorrhage, unspecified: Secondary | ICD-10-CM | POA: Insufficient documentation

## 2022-06-26 DIAGNOSIS — D62 Acute posthemorrhagic anemia: Secondary | ICD-10-CM | POA: Diagnosis not present

## 2022-06-26 DIAGNOSIS — N184 Chronic kidney disease, stage 4 (severe): Secondary | ICD-10-CM

## 2022-06-26 HISTORY — PX: COLONOSCOPY WITH PROPOFOL: SHX5780

## 2022-06-26 HISTORY — PX: ESOPHAGOGASTRODUODENOSCOPY (EGD) WITH PROPOFOL: SHX5813

## 2022-06-26 LAB — HEMOGLOBIN AND HEMATOCRIT, BLOOD
HCT: 22.3 % — ABNORMAL LOW (ref 36.0–46.0)
HCT: 29.9 % — ABNORMAL LOW (ref 36.0–46.0)
Hemoglobin: 6.2 g/dL — ABNORMAL LOW (ref 12.0–15.0)
Hemoglobin: 8.9 g/dL — ABNORMAL LOW (ref 12.0–15.0)

## 2022-06-26 LAB — BASIC METABOLIC PANEL
Anion gap: 5 (ref 5–15)
BUN: 33 mg/dL — ABNORMAL HIGH (ref 8–23)
CO2: 26 mmol/L (ref 22–32)
Calcium: 8 mg/dL — ABNORMAL LOW (ref 8.9–10.3)
Chloride: 110 mmol/L (ref 98–111)
Creatinine, Ser: 1.74 mg/dL — ABNORMAL HIGH (ref 0.44–1.00)
GFR, Estimated: 29 mL/min — ABNORMAL LOW (ref >60)
Glucose, Bld: 86 mg/dL (ref 70–99)
Potassium: 3.8 mmol/L (ref 3.5–5.1)
Sodium: 141 mmol/L (ref 135–145)

## 2022-06-26 LAB — GLUCOSE, CAPILLARY
Glucose-Capillary: 90 mg/dL (ref 70–99)
Glucose-Capillary: 84 mg/dL (ref 70–99)
Glucose-Capillary: 117 mg/dL — ABNORMAL HIGH (ref 70–99)
Glucose-Capillary: 105 mg/dL — ABNORMAL HIGH (ref 70–99)

## 2022-06-26 LAB — CBG MONITORING, ED: Glucose-Capillary: 74 mg/dL (ref 70–99)

## 2022-06-26 LAB — PREPARE RBC (CROSSMATCH)

## 2022-06-26 SURGERY — ESOPHAGOGASTRODUODENOSCOPY (EGD) WITH PROPOFOL
Anesthesia: General

## 2022-06-26 MED ORDER — FUROSEMIDE 10 MG/ML IJ SOLN
20.0000 mg | Freq: Once | INTRAMUSCULAR | Status: AC
  Administered 2022-06-26: 20 mg via INTRAVENOUS
  Filled 2022-06-26: qty 4

## 2022-06-26 MED ORDER — PROPOFOL 1000 MG/100ML IV EMUL
INTRAVENOUS | Status: AC
  Filled 2022-06-26: qty 100

## 2022-06-26 MED ORDER — SODIUM CHLORIDE 0.9 % IV SOLN: INTRAVENOUS | Status: DC

## 2022-06-26 MED ORDER — PROPOFOL 10 MG/ML IV BOLUS
INTRAVENOUS | Status: DC | PRN
  Administered 2022-06-26: 50 mg via INTRAVENOUS

## 2022-06-26 MED ORDER — SODIUM CHLORIDE 0.9% IV SOLUTION
Freq: Once | INTRAVENOUS | Status: AC
  Filled 2022-06-26: qty 250

## 2022-06-26 MED ORDER — PANTOPRAZOLE SODIUM 40 MG IV SOLR: 40.0000 mg | Freq: Two times a day (BID) | INTRAVENOUS | Status: DC

## 2022-06-26 MED ORDER — PANTOPRAZOLE 80MG IVPB - SIMPLE MED
8.0000 mg/h | INTRAVENOUS | Status: DC
  Administered 2022-06-26 – 2022-06-28 (×6): 8 mg/h via INTRAVENOUS
  Filled 2022-06-26 (×11): qty 100

## 2022-06-26 MED ORDER — LIDOCAINE HCL (CARDIAC) PF 100 MG/5ML IV SOSY
PREFILLED_SYRINGE | INTRAVENOUS | Status: DC | PRN
  Administered 2022-06-26: 100 mg via INTRAVENOUS

## 2022-06-26 MED ORDER — PROPOFOL 500 MG/50ML IV EMUL
INTRAVENOUS | Status: DC | PRN
  Administered 2022-06-26: 150 ug/kg/min via INTRAVENOUS

## 2022-06-26 MED ORDER — PHENYLEPHRINE HCL (PRESSORS) 10 MG/ML IV SOLN
INTRAVENOUS | Status: DC | PRN
  Administered 2022-06-26: 160 ug via INTRAVENOUS

## 2022-06-26 NOTE — Progress Notes (Signed)
Telephone consent for capsule endoscopy was obtained by telephone with patient's son Estreya Clay at [phone]. Capsule endoscopy study has been initiated at this time without complications. Endoscopy RN will return later this evening to remove equipment and download recorder.

## 2022-06-26 NOTE — Anesthesia Preprocedure Evaluation (Addendum)
Anesthesia Evaluation  Patient identified by MRN, date of birth, ID band Patient awake    Reviewed: Allergy & Precautions, NPO status , Patient's Chart, lab work & pertinent test results  History of Anesthesia Complications (+) PONV and history of anesthetic complications  Airway Mallampati: IV   Neck ROM: Full    Dental  (+) Upper Dentures, Lower Dentures   Pulmonary neg pulmonary ROS   Pulmonary exam normal breath sounds clear to auscultation       Cardiovascular hypertension, + CAD, + Past MI and + Cardiac Stents  Normal cardiovascular exam Rhythm:Regular Rate:Normal  ECG 06/25/22:  Sinus rhythm with 1st degree A-V block Left axis deviation Right bundle branch block Inferior infarct (cited on or before 11-Jun-2020) Anterolateral infarct (cited on or before 11-Jun-2020)  Echo 07/25/19: NORMAL LEFT VENTRICULAR SYSTOLIC FUNCTION  NORMAL RIGHT VENTRICULAR SYSTOLIC FUNCTION  MILD VALVULAR REGURGITATION  MILD VALVULAR STENOSIS  A flutter seen on telemetry    Neuro/Psych negative neurological ROS     GI/Hepatic negative GI ROS,,,  Endo/Other  diabetes, Poorly Controlled, Type 2  Class 3 obesity  Renal/GU Renal disease (stage IV CKD)     Musculoskeletal   Abdominal   Peds  Hematology  (+) Blood dyscrasia, anemia   Anesthesia Other Findings Cardiology note 02/04/22:  Coronary artery disease involving native coronary artery of native heart with unstable angina pectoris ([place]) - Plan: EKG 12-Lead Currently with no symptoms of angina. No further workup at this time. Continue current medication regimen.   Mixed hyperlipidemia - Plan: EKG 12-Lead Cholesterol slightly above goal, recommended diet for weight loss, unable to exercise.  In follow-up could add Zetia   Essential hypertension - Plan: EKG 12-Lead She previously declined changing diltiazem but she is more receptive today as legs are more swollen Recommend she  stop the diltiazem Change to metoprolol succinate 100 in the morning 50 in the evening, start Cardura 2 mg twice daily, closely monitor blood pressure and call us with numbers Increase Lasix as below   Type 2 diabetes mellitus with other circulatory complication, without long-term current use of insulin (HCC) -  A1c 6.5, high end of her range Low carbohydrate diet recommended   Morbid obesity due to excess calories ([place]) - Plan: EKG 12-Lead We have encouraged careful diet management in an effort to lose weight.   Chronic diastolic CHF with pulmonary HTN Exacerbated by morbid obesity, Very sedentary, high fluid intake, some restaurant food Difficulty losing weight, up to 80 relatively immobile, sedentary Recommend she increase Lasix up to 40 daily Has lab work with Dr. Sabra Heck in several weeks time May need to change to torsemide if no improvement with Lasix 40 off diltiazem   Reproductive/Obstetrics                             Anesthesia Physical Anesthesia Plan  ASA: 3  Anesthesia Plan: General   Post-op Pain Management:    Induction: Intravenous  PONV Risk Score and Plan: 4 or greater and Propofol infusion, TIVA and Treatment may vary due to age or medical condition  Airway Management Planned: Natural Airway  Additional Equipment:   Intra-op Plan:   Post-operative Plan:   Informed Consent: I have reviewed the patients History and Physical, chart, labs and discussed the procedure including the risks, benefits and alternatives for the proposed anesthesia with the patient or authorized representative who has indicated his/her understanding and acceptance.  Plan Discussed with: CRNA  Anesthesia Plan Comments: (LMA/GETA backup discussed.  Patient consented for risks of anesthesia including but not limited to:  - adverse reactions to medications - damage to eyes, teeth, lips or other oral mucosa - nerve damage due to positioning  - sore  throat or hoarseness - damage to heart, brain, nerves, lungs, other parts of body or loss of life  Informed patient about role of CRNA in peri- and intra-operative care.  Patient voiced understanding.)        Anesthesia Quick Evaluation

## 2022-06-26 NOTE — Anesthesia Postprocedure Evaluation (Signed)
Anesthesia Post Note  Patient: [name]  Procedure(s) Performed: ESOPHAGOGASTRODUODENOSCOPY (EGD) WITH PROPOFOL COLONOSCOPY WITH PROPOFOL  Patient location during evaluation: PACU Anesthesia Type: General Level of consciousness: awake and alert, oriented and patient cooperative Pain management: pain level controlled Vital Signs Assessment: post-procedure vital signs reviewed and stable Respiratory status: spontaneous breathing, nonlabored ventilation and respiratory function stable Cardiovascular status: blood pressure returned to baseline and stable Postop Assessment: adequate PO intake Anesthetic complications: no   No notable events documented.   Last Vitals:  Vitals:   06/26/22 1341 06/26/22 1358  BP: (!) 147/49 (!) 159/64  Pulse:    Resp:    Temp: (!) 36.3 C   SpO2:      Last Pain:  Vitals:   06/26/22 1358  TempSrc:   PainSc: 0-No pain                 Darrin Nipper

## 2022-06-26 NOTE — Transfer of Care (Signed)
Immediate Anesthesia Transfer of Care Note  Patient: Melissa Christian  Procedure(s) Performed: ESOPHAGOGASTRODUODENOSCOPY (EGD) WITH PROPOFOL COLONOSCOPY WITH PROPOFOL  Patient Location: Endoscopy Unit  Anesthesia Type:General  Level of Consciousness: awake, alert , and oriented  Airway & Oxygen Therapy: Patient Spontanous Breathing  Post-op Assessment: Report given to RN and Post -op Vital signs reviewed and stable  Post vital signs: Reviewed and stable  Last Vitals:  Vitals Value Taken Time  BP 147/49 06/26/22 1341  Temp    Pulse 99 06/26/22 1342  Resp 17 06/26/22 1342  SpO2 99 % 06/26/22 1342  Vitals shown include unvalidated device data.  Last Pain:  Vitals:   06/26/22 1259  TempSrc:   PainSc: 0-No pain         Complications: No notable events documented.

## 2022-06-26 NOTE — Hospital Course (Signed)
81 year old female with past medical history of morbid obesity, CAD, diabetes mellitus, stage IV CKD and hypertension who presented to the emergency room on 12/23 for progressively worsening shortness of breath.  Patient related that for the past few months, she is had dyspnea on exertion and noted to have dark tarry stools.  She had received several iron infusions with the most recent one being 12/22.  Initially had been resistant to having this worked up, but has been more amenable over the past few weeks.  In the emergency room, patient found to have a hemoglobin of 6.7 which was down from 8.0 11 days prior.  Patient was transfused 1 unit packed red blood cells which improved her hemoglobin that evening to 7.5, but by following morning, hemoglobin down to 6.2.  Blood pressure and heart rate remained stable.  Patient transfused 2 more units packed red blood cells.  Gastroenterology consulted and took patient for EGD and colonoscopy on afternoon of 12/24, both of which were unremarkable.  Plan is for patient for capsule endoscopy next.

## 2022-06-26 NOTE — H&P (Signed)
Jonathon Bellows, MD [address], [place], [place], [place], [address] [address], [place], [place], [place], [address] Phone: [phone]  Fax: [phone]  Primary Care Physician:  Rusty Aus, MD   Pre-Procedure History & Physical: HPI:  Melissa Christian is a 81 y.o. female is here for an endoscopy and colonoscopy    Past Medical History:  Diagnosis Date   Anemia    CAD (coronary artery disease)    a. NSTEMI 09/2015: cardiac cath: LM no obs dz, mLAD 95% s/p PCI/DES 0%, LCx no obs, RCA no obs, LVEF 55-65%   Chronic kidney disease (CKD), stage III (moderate) (HCC)    Diabetes mellitus with complication (HCC)    HLD (hyperlipidemia)    Hypertension    Low blood potassium    NSTEMI (non-ST elevated myocardial infarction) (HCC)    PONV (postoperative nausea and vomiting)    Valvular heart disease    a. 09/2015: EF 55-60%, challenging images unable to exclude mild mid-distal anterior to apical HK, nl LV dias fxn, mild AI/MR, normal size of left atrium, RV systolic function normal, PASP normal      Past Surgical History:  Procedure Laterality Date   BACK SURGERY     CARDIAC CATHETERIZATION N/A 09/23/2015   Procedure: Right and Left Heart Cath;  Surgeon: Minna Merritts, MD;  Location: [facility];  Service: Cardiovascular;  Laterality: N/A;   CARDIAC CATHETERIZATION N/A 09/23/2015   Procedure: Coronary Stent Intervention;  Surgeon: Yolonda Kida, MD;  Location: [facility];  Service: Cardiovascular;  Laterality: N/A;   CATARACT EXTRACTION W/PHACO Left 04/26/2017   Procedure: CATARACT EXTRACTION PHACO AND INTRAOCULAR LENS PLACEMENT ([place]) LEFT DIABETIC;  Surgeon: Leandrew Koyanagi, MD;  Location: [facility];  Service: Ophthalmology;  Laterality: Left;  diabetic-oral meds   CATARACT EXTRACTION W/PHACO Right 05/31/2017   Procedure: CATARACT EXTRACTION PHACO AND INTRAOCULAR LENS PLACEMENT ([place]) RIGHT DIABETIC;  Surgeon: Leandrew Koyanagi, MD;   Location: [facility];  Service: Ophthalmology;  Laterality: Right;   CHOLECYSTECTOMY N/A 06/16/2016   Procedure: LAPAROSCOPIC CHOLECYSTECTOMY WITH INTRAOPERATIVE CHOLANGIOGRAM;  Surgeon: Leonie Green, MD;  Location: [facility];  Service: General;  Laterality: N/A;   CORONARY STENT PLACEMENT     JOINT REPLACEMENT     LUMBAR FUSION     TOTAL HIP ARTHROPLASTY      Prior to Admission medications   Medication Sig Start Date End Date Taking? Authorizing Provider  aspirin EC 81 MG tablet Take 81 mg by mouth daily.   Yes [provider]  Cholecalciferol (VITAMIN D3) 2000 units TABS Take 2,000 Units by mouth daily.   Yes [provider]  doxazosin (CARDURA) 2 MG tablet Take 1 tablet (2 mg total) by mouth 2 (two) times daily. 02/04/22  Yes Gollan, Kathlene November, MD  furosemide (LASIX) 40 MG tablet Take 1 tablet (40 mg total) by mouth daily. 02/04/22  Yes Minna Merritts, MD  metoprolol succinate (TOPROL-XL) 100 MG 24 hr tablet Take 1 tablet in the morning and 1/2 tablet in the evening. Patient taking differently: Take 50-100 mg by mouth 2 (two) times daily. Take 1 tablet in the morning and 1/2 tablet in the evening. 02/04/22  Yes Gollan, Kathlene November, MD  pioglitazone (ACTOS) 30 MG tablet Take 30 mg by mouth daily.  05/07/18  Yes [provider]  rosuvastatin (CRESTOR) 40 MG tablet Take 1 tablet (40 mg total) by mouth daily. 02/04/22  Yes Minna Merritts,  MD  triamterene-hydrochlorothiazide (DYAZIDE) 37.5-25 MG capsule Take 1 each (1 capsule total) by mouth every other day. 02/04/22  Yes Gollan, Kathlene November, MD  B Complex-C-Folic Acid (RENA-VITE RX) 1 MG TABS Take 1 tablet by mouth daily. Patient not taking: Reported on 06/25/2022 08/17/18   [provider]  cyanocobalamin (VITAMIN B12) 1000 MCG/ML injection Inject 1,000 mcg into the muscle every 14 (fourteen) days. Patient not taking: Reported on 06/25/2022 11/23/21   [provider]  IV Sets-Tubing (INFUSION  SET) MISC by Does not apply route. Iron infusion weekly at facility    [provider]  TRULICITY 1.5 ZG/0.1VC SOPN INJECT 1.5 MG SUBCUTANEOUSLY EVERY 7 (SEVEN) DAYS 07/02/18   [provider]    Allergies as of 06/25/2022 - Review Complete 06/25/2022  Allergen Reaction Noted   Ferrous sulfate Itching 09/22/2015   Keflex [cephalexin] Itching 09/22/2015   Percocet [oxycodone-acetaminophen] Itching 09/22/2015   Vicodin [hydrocodone-acetaminophen] Itching 09/22/2015   Ace inhibitors Rash 10/12/2015   Penicillins Rash 10/12/2015    Family History  Problem Relation Age of Onset   Diabetes Mother    Hypertension Mother    Heart disease Father    Cancer Maternal Grandfather     Social History   Socioeconomic History   Marital status: Widowed    Spouse name: Not on file   Number of children: Not on file   Years of education: Not on file   Highest education level: Not on file  Occupational History   Not on file  Tobacco Use   Smoking status: Never   Smokeless tobacco: Never  Vaping Use   Vaping Use: Never used  Substance and Sexual Activity   Alcohol use: No    Alcohol/week: 0.0 standard drinks of alcohol   Drug use: No   Sexual activity: Not Currently    Birth control/protection: None, Post-menopausal  Other Topics Concern   Not on file  Social History Narrative   Not on file   Social Determinants of Health   Financial Resource Strain: Not on file  Food Insecurity: Not on file  Transportation Needs: Not on file  Physical Activity: Not on file  Stress: Not on file  Social Connections: Not on file  Intimate Partner Violence: Not on file    Review of Systems: See HPI, otherwise negative ROS  Physical Exam: BP (!) 145/55   Pulse 84   Temp 97.7 F (36.5 C) (Oral)   Resp (!) 23   Ht '5\' 4"'$  (1.626 m)   Wt 113.4 kg   SpO2 99%   BMI 42.91 kg/m  General:   Alert,  pleasant and cooperative in NAD Head:  Normocephalic and atraumatic. Neck:   Supple; no masses or thyromegaly. Lungs:  Clear throughout to auscultation, normal respiratory effort.    Heart:  +S1, +S2, Regular rate and rhythm, No edema. Abdomen:  Soft, nontender and nondistended. Normal bowel sounds, without guarding, and without rebound.   Neurologic:  Alert and  oriented x4;  grossly normal neurologically.  Impression/Plan: Melissa Christian is here for an endoscopy and colonoscopy  to be performed for  evaluation of melena    Risks, benefits, limitations, and alternatives regarding endoscopy have been reviewed with the patient.  Questions have been answered.  All parties agreeable.   Jonathon Bellows, MD  06/26/2022, 12:55 PM

## 2022-06-26 NOTE — ED Notes (Signed)
Blood started with oncoming RN Jiles Garter. Pt breathing e/u with no needs.

## 2022-06-26 NOTE — Progress Notes (Signed)
[facility] Progress Note  Patient: Melissa Christian    WUJ:[id number]  DOA: 06/25/2022    Date of Service: the patient was seen and examined on 06/26/2022  Brief hospital course: 81 year old female with past medical history of morbid obesity, CAD, diabetes mellitus, stage IV CKD and hypertension who presented to the emergency room on 12/23 for progressively worsening shortness of breath.  Patient related that for the past few months, she is had dyspnea on exertion and noted to have dark tarry stools.  She had received several iron infusions with the most recent one being 12/22.  Initially had been resistant to having this worked up, but has been more amenable over the past few weeks.  In the emergency room, patient found to have a hemoglobin of 6.7 which was down from 8.0 11 days prior.  Patient was transfused 1 unit packed red blood cells which improved her hemoglobin that evening to 7.5, but by following morning, hemoglobin down to 6.2.  Blood pressure and heart rate remained stable.  Patient transfused 2 more units packed red blood cells.  Gastroenterology consulted and took patient for EGD and colonoscopy on afternoon of 12/24, both of which were unremarkable.  Plan is for patient for capsule endoscopy next.   Assessment and Plan: * ABLA (acute blood loss anemia) Status post 3 units packed red blood cells total.  I do not think that she had worsening bleeding when she first came in but rather this is more from hemoconcentration initially due to blood loss with more accurate readings on 12/24 morning.  Status post unremarkable EGD and colonoscopy.  For capsule endoscopy next.  Follow-up on H&H.  Continue IV Protonix.    Symptomatic anemia Treatment as outlined in 1  CKD stage 4 due to type 2 diabetes mellitus ([place]) Patient has type 2 diabetes mellitus with complications of stage IV chronic kidney disease Hold Actos and Trulicity Glycemic control with sliding scale insulin Monitor renal  function closely  CAD (coronary artery disease) Hold aspirin due to acute blood loss anemia Hold metoprolol for now since patient is normotensive  Morbid obesity due to excess calories [facility]) Patient meets criteria with BMI greater than 40  HTN (hypertension) Hold all antihypertensive medications for now since patient is normotensive       Body mass index is 42.91 kg/m.        Consultants: Gastroenterology  Procedures: Blood transfusions on 12/23 and 12/24 EGD unremarkable other than 1 duodenal polyp and several gastric polyps which were removed Colonoscopy unremarkable  Antimicrobials: None  Code Status: Full code   Subjective: Patient doing okay, no complaints  Objective: Vital signs were reviewed and unremarkable. Vitals:   06/26/22 1358 06/26/22 1401  BP: (!) 159/64 (!) 159/64  Pulse: 91 91  Resp: (!) 21 (!) 21  Temp:    SpO2: 96% 95%    Intake/Output Summary (Last 24 hours) at 06/26/2022 1530 Last data filed at 06/26/2022 1328 Gross per 24 hour  Intake 934.25 ml  Output 650 ml  Net 284.25 ml   Filed Weights   06/25/22 1046 06/25/22 1047  Weight: 113.4 kg 113.4 kg   Body mass index is 42.91 kg/m.  Exam:  General: Alert and oriented x 3, no acute distress HEENT: Cephalic, atraumatic, mucous membranes are moist Cardiovascular: Regular rate and rhythm, S1-S2 Respiratory: Clear to auscultation bilaterally Abdomen: Soft, nontender, nondistended, positive bowel sounds Musculoskeletal: No clubbing or cyanosis, trace pitting edema Skin: No skin breaks, tears or lesions, other than fever  blister underneath her right nares Psychiatry: Appropriate, no evidence of psychoses Neurology: No focal deficits  Data Reviewed: Creatinine 1.74 hemoglobin 6.2 this morning, down from 7.510 hours prior  Disposition:  Status is: Inpatient Remains inpatient appropriate because:  -Capsule endoscopy -Stabilization of hemoglobin    Anticipated discharge date:  12/26   Family Communication: Multiple family members at the bedside DVT Prophylaxis: SCDs    Author: Annita Brod ,MD 06/26/2022 3:30 PM  To reach On-call, see care teams to locate the attending and reach out via [email or web address]. Between 7PM-7AM, please contact night-coverage If you still have difficulty reaching the attending provider, please page the [facility] (Director on Call) for [facility] on amion for assistance.

## 2022-06-26 NOTE — Op Note (Signed)
[facility] Gastroenterology Patient Name: Melissa Christian Procedure Date: 06/26/2022 12:27 PM MRN: [id number] Account #: [id number] Date of Birth: Nov 09, 1940 Admit Type: Inpatient Age: 81 Room: [facility] ENDO ROOM 1 Gender: Female Note Status: Finalized Instrument Name: Jasper Riling [id number] Procedure:             Colonoscopy Indications:           Melena Providers:             Jonathon Bellows MD, MD Medicines:             Monitored Anesthesia Care Complications:         No immediate complications. Procedure:             Pre-Anesthesia Assessment:                        - Prior to the procedure, a History and Physical was                         performed, and patient medications, allergies and                         sensitivities were reviewed. The patient's tolerance                         of previous anesthesia was reviewed.                        - The risks and benefits of the procedure and the                         sedation options and risks were discussed with the                         patient. All questions were answered and informed                         consent was obtained.                        - ASA Grade Assessment: II - A patient with mild                         systemic disease.                        After obtaining informed consent, the colonoscope was                         passed under direct vision. Throughout the procedure,                         the patient's blood pressure, pulse, and oxygen                         saturations were monitored continuously. The                         Colonoscope was introduced through the anus and  advanced to the the terminal ileum. The colonoscopy                         was performed with ease. The patient tolerated the                         procedure well. The quality of the bowel preparation                         was fair. The terminal ileum, ileocecal valve,                          appendiceal orifice, and rectum were photographed. Findings:      The perianal and digital rectal examinations were normal.      The terminal ileum appeared normal.      The colon (entire examined portion) appeared normal.      The retroflexed view of the distal rectum and anal verge was normal and       showed no anal or rectal abnormalities. Impression:            - Preparation of the colon was fair.                        - The examined portion of the ileum was normal.                        - The entire examined colon is normal.                        - The distal rectum and anal verge are normal on                         retroflexion view.                        - No specimens collected. Recommendation:        - Return patient to hospital ward for ongoing care.                        - Clear liquid diet.                        - Continue present medications.                        - To visualize the small bowel, perform video capsule                         endoscopy tomorrow.                        - NPO at midnight.                        - prep was fair , withing limitations of prep no                         active bleeding seen , old black stool seen ,no blood  in terminal ileum , no source of bleeding seen on                         EGD/colonoscopy Procedure Code(s):     --- Professional ---                        [phone], Colonoscopy, flexible; diagnostic, including                         collection of specimen(s) by brushing or washing, when                         performed (separate procedure) Diagnosis Code(s):     --- Professional ---                        K92.1, Melena (includes Hematochezia) CPT copyright 2022 American Medical Association. All rights reserved. The codes documented in this report are preliminary and upon coder review may  be revised to meet current compliance requirements. Jonathon Bellows, MD Jonathon Bellows MD, MD 06/26/2022  1:33:49 PM This report has been signed electronically. Number of Addenda: 0 Note Initiated On: 06/26/2022 12:27 PM Scope Withdrawal Time: 0 hours 9 minutes 59 seconds  Total Procedure Duration: 0 hours 12 minutes 21 seconds  Estimated Blood Loss:  Estimated blood loss: none.      [facility]

## 2022-06-26 NOTE — Op Note (Signed)
[facility] Gastroenterology Patient Name: Melissa Christian Procedure Date: 06/26/2022 12:27 PM MRN: [id number] Account #: [id number] Date of Birth: 03/13/1941 Admit Type: Inpatient Age: 81 Room: [facility] ENDO ROOM 1 Gender: Female Note Status: Finalized Instrument Name: Upper Endoscope [id number] Procedure:             Upper GI endoscopy Indications:           Melena Providers:             Jonathon Bellows MD, MD Medicines:             Monitored Anesthesia Care Complications:         No immediate complications. Procedure:             Pre-Anesthesia Assessment:                        - Prior to the procedure, a History and Physical was                         performed, and patient medications, allergies and                         sensitivities were reviewed. The patient's tolerance                         of previous anesthesia was reviewed.                        - The risks and benefits of the procedure and the                         sedation options and risks were discussed with the                         patient. All questions were answered and informed                         consent was obtained.                        - ASA Grade Assessment: III - A patient with severe                         systemic disease.                        After obtaining informed consent, the endoscope was                         passed under direct vision. Throughout the procedure,                         the patient's blood pressure, pulse, and oxygen                         saturations were monitored continuously. The Endoscope                         was introduced through the mouth, and advanced to the  third part of duodenum. The upper GI endoscopy was                         accomplished with ease. The patient tolerated the                         procedure well. Findings:      The esophagus was normal.      A few small semi-sessile polyps with stigmata of  recent bleeding were       found on the greater curvature of the stomach.      A single large carpet-like polyp was found in the third portion of the       duodenum. Biopsies were taken with a cold forceps for histology.      The cardia and gastric fundus were normal on retroflexion. Impression:            - Normal esophagus.                        - A few gastric polyps.                        - A single duodenal polyp. Biopsied. Recommendation:        - Await pathology results.                        - Perform a colonoscopy today. Procedure Code(s):     --- Professional ---                        [phone], Esophagogastroduodenoscopy, flexible,                         transoral; with biopsy, single or multiple Diagnosis Code(s):     --- Professional ---                        K31.7, Polyp of stomach and duodenum                        K92.1, Melena (includes Hematochezia) CPT copyright 2022 American Medical Association. All rights reserved. The codes documented in this report are preliminary and upon coder review may  be revised to meet current compliance requirements. Jonathon Bellows, MD Jonathon Bellows MD, MD 06/26/2022 1:16:27 PM This report has been signed electronically. Number of Addenda: 0 Note Initiated On: 06/26/2022 12:27 PM Estimated Blood Loss:  Estimated blood loss: none.      [facility]

## 2022-06-27 DIAGNOSIS — E1122 Type 2 diabetes mellitus with diabetic chronic kidney disease: Secondary | ICD-10-CM | POA: Diagnosis not present

## 2022-06-27 DIAGNOSIS — D649 Anemia, unspecified: Secondary | ICD-10-CM | POA: Diagnosis not present

## 2022-06-27 DIAGNOSIS — D62 Acute posthemorrhagic anemia: Secondary | ICD-10-CM | POA: Diagnosis not present

## 2022-06-27 LAB — GLUCOSE, CAPILLARY
Glucose-Capillary: 93 mg/dL (ref 70–99)
Glucose-Capillary: 151 mg/dL — ABNORMAL HIGH (ref 70–99)
Glucose-Capillary: 97 mg/dL (ref 70–99)
Glucose-Capillary: 113 mg/dL — ABNORMAL HIGH (ref 70–99)

## 2022-06-27 LAB — CBC
HCT: 27.8 % — ABNORMAL LOW (ref 36.0–46.0)
Hemoglobin: 8 g/dL — ABNORMAL LOW (ref 12.0–15.0)
MCH: 26.7 pg (ref 26.0–34.0)
MCHC: 28.8 g/dL — ABNORMAL LOW (ref 30.0–36.0)
MCV: 92.7 fL (ref 80.0–100.0)
Platelets: 228 10*3/uL (ref 150–400)
RBC: 3 MIL/uL — ABNORMAL LOW (ref 3.87–5.11)
RDW: 20.4 % — ABNORMAL HIGH (ref 11.5–15.5)
WBC: 8.5 10*3/uL (ref 4.0–10.5)
nRBC: 0 % (ref 0.0–0.2)

## 2022-06-27 LAB — BASIC METABOLIC PANEL
Anion gap: 7 (ref 5–15)
BUN: 32 mg/dL — ABNORMAL HIGH (ref 8–23)
CO2: 25 mmol/L (ref 22–32)
Calcium: 8 mg/dL — ABNORMAL LOW (ref 8.9–10.3)
Chloride: 108 mmol/L (ref 98–111)
Creatinine, Ser: 1.93 mg/dL — ABNORMAL HIGH (ref 0.44–1.00)
GFR, Estimated: 26 mL/min — ABNORMAL LOW (ref >60)
Glucose, Bld: 106 mg/dL — ABNORMAL HIGH (ref 70–99)
Potassium: 4.1 mmol/L (ref 3.5–5.1)
Sodium: 140 mmol/L (ref 135–145)

## 2022-06-27 LAB — VITAMIN B12: Vitamin B-12: 2785 pg/mL — ABNORMAL HIGH (ref 180–914)

## 2022-06-27 NOTE — Progress Notes (Signed)
   Jonathon Bellows , MD [address], [place], [place], [place], [address] [address], [place], [place], [place], [address] Phone: [phone]  Fax: [phone]   MELONEY FELD is being followed for melena   Subjective: Doing well eating , no bowel movements so far   Objective: Vital signs in last 24 hours: Vitals:   06/27/22 0057 06/27/22 0534 06/27/22 0744 06/27/22 1201  BP: (!) 113/41 111/88 (!) 120/45 (!) 123/59  Pulse: 75 80 72 67  Resp: '16 18 16 20  '$ Temp: 98.1 F (36.7 C) 98.2 F (36.8 C) 98.9 F (37.2 C) 99 F (37.2 C)  TempSrc: Oral     SpO2: 98% 93% 98% 99%  Weight:      Height:       Weight change:   Intake/Output Summary (Last 24 hours) at 06/27/2022 1313 Last data filed at 06/27/2022 3276 Gross per 24 hour  Intake 888.61 ml  Output 460 ml  Net 428.61 ml     Exam: Heart:: Regular rate and rhythm Lungs: normal Abdomen: soft, nontender, normal bowel sounds   Lab Results: '@LABTEST2'$ @ Micro Results: No results found for this or any previous visit (from the past 240 hour(s)). Studies/Results: No results found. Medications: I have reviewed the patient's current medications. Scheduled Meds:  insulin aspart  0-15 Units Subcutaneous TID WC   [START ON 06/29/2022] pantoprazole  40 mg Intravenous Q12H   sodium chloride flush  3 mL Intravenous Q12H   Continuous Infusions:  sodium chloride     sodium chloride 10 mL/hr at 06/25/22 1600   pantoprazole 8 mg/hr (06/27/22 0630)   PRN Meds:.sodium chloride, ondansetron **OR** ondansetron (ZOFRAN) IV, sodium chloride flush   Assessment: Principal Problem:   ABLA (acute blood loss anemia) Active Problems:   HTN (hypertension)   Morbid obesity due to excess calories ([facility])   CAD (coronary artery disease)   Symptomatic anemia   CKD stage 4 due to type 2 diabetes mellitus ([place])   Eryka J Crutchfield 81 y.o. female on aspirin presented with melena, EGD and colonoscopy showed no evidence of bleeding  underwent capsule study of the small bowel yesterday , the capsule stayed in the stomach the entire study and didn't enter the small bowel . 1 gram drop in hb since yesterday    Plan: Incomplete capsule study - suggest if doesn't pass capsule in stool by tomorrow then get X ray abdomen to confirm exit and then will need a repeat capsule placed via EGD Monitor cbc and transfuse as needed if has active bleed get CT angiogram.    LOS: 2 days   Jonathon Bellows, MD 06/27/2022, 1:13 PM

## 2022-06-27 NOTE — Progress Notes (Signed)
[facility] Progress Note  Patient: Melissa Christian    RKY:[id number]  DOA: 06/25/2022    Date of Service: the patient was seen and examined on 06/27/2022  Brief hospital course: 81 year old female with past medical history of morbid obesity, CAD, diabetes mellitus, stage IV CKD and hypertension who presented to the emergency room on 12/23 for progressively worsening shortness of breath.  Patient related that for the past few months, she is had dyspnea on exertion and noted to have dark tarry stools.  She had received several iron infusions with the most recent one being 12/22.  Initially had been resistant to having this worked up, but has been more amenable over the past few weeks.  In the emergency room, patient found to have a hemoglobin of 6.7 which was down from 8.0 11 days prior.  Patient was transfused 1 unit packed red blood cells which improved her hemoglobin that evening to 7.5, but by following morning, hemoglobin down to 6.2.  Blood pressure and heart rate remained stable.  Patient transfused 2 more units packed red blood cells.  Gastroenterology consulted and took patient for EGD and colonoscopy on afternoon of 12/24, both of which were unremarkable.  Patient subsequently went for capsule endoscopy and currently waiting for capsule to be passed.   Assessment and Plan: * ABLA (acute blood loss anemia) Status post 3 units packed red blood cells total.  I do not think that she had worsening bleeding when she first came in but rather this is more from hemoconcentration initially due to blood loss with more accurate readings on 12/24 morning.  That said, hemoglobin at 8.9 yesterday and today at 8 with increasing creatinine.  Patient status post unremarkable EGD and colonoscopy.  Status post capsule endoscopy, now waiting for capsule to pass.  Symptomatic anemia Treatment as outlined in 1  CKD stage 4 due to type 2 diabetes mellitus ([place]) Patient has type 2 diabetes mellitus with  complications of stage IV chronic kidney disease Hold Actos and Trulicity Glycemic control with sliding scale insulin Monitor renal function closely  CAD (coronary artery disease) Hold aspirin due to acute blood loss anemia Hold metoprolol for now since patient is normotensive  Morbid obesity due to excess calories [facility]) Patient meets criteria with BMI greater than 40  HTN (hypertension) Hold all antihypertensive medications for now since patient is normotensive       Body mass index is 42.91 kg/m.        Consultants: Gastroenterology  Procedures: Blood transfusions on 12/23 and 12/24 EGD unremarkable other than 1 duodenal polyp and several gastric polyps which were removed Colonoscopy unremarkable Capsule endoscopy with results pending  Antimicrobials: None  Code Status: Full code   Subjective: Patient slightly somnolent, otherwise no complaints  Objective: Vital signs were reviewed and unremarkable. Vitals:   06/27/22 0744 06/27/22 1201  BP: (!) 120/45 (!) 123/59  Pulse: 72 67  Resp: 16 20  Temp: 98.9 F (37.2 C) 99 F (37.2 C)  SpO2: 98% 99%    Intake/Output Summary (Last 24 hours) at 06/27/2022 1443 Last data filed at 06/27/2022 0752 Gross per 24 hour  Intake 388.61 ml  Output 460 ml  Net -71.39 ml    Filed Weights   06/25/22 1046 06/25/22 1047  Weight: 113.4 kg 113.4 kg   Body mass index is 42.91 kg/m.  Exam:  General: Alert and oriented x 3, no acute distress HEENT: Cephalic, atraumatic, mucous membranes are moist Cardiovascular: Regular rate and rhythm, S1-S2 Respiratory: Clear  to auscultation bilaterally Abdomen: Soft, nontender, nondistended, positive bowel sounds Musculoskeletal: No clubbing or cyanosis, trace pitting edema Skin: No skin breaks, tears or lesions, other than fever blister underneath her right nares Psychiatry: Appropriate, no evidence of psychoses Neurology: No focal deficits  Data Reviewed: Creatinine at  1.93, up from 1.74. Hemoglobin 8.0, down from 8.9 transfusion  Disposition:  Status is: Inpatient Remains inpatient appropriate because:  -Results of capsule endoscopy -Stabilization of hemoglobin    Anticipated discharge date: 12/27   Family Communication: Will call family DVT Prophylaxis: SCDs    Author: Annita Brod ,MD 06/27/2022 2:43 PM  To reach On-call, see care teams to locate the attending and reach out via [email or web address]. Between 7PM-7AM, please contact night-coverage If you still have difficulty reaching the attending provider, please page the [facility] (Director on Call) for [facility] on amion for assistance.

## 2022-06-28 ENCOUNTER — Inpatient Hospital Stay: Payer: Medicare HMO

## 2022-06-28 DIAGNOSIS — E1122 Type 2 diabetes mellitus with diabetic chronic kidney disease: Secondary | ICD-10-CM | POA: Diagnosis not present

## 2022-06-28 DIAGNOSIS — D62 Acute posthemorrhagic anemia: Secondary | ICD-10-CM | POA: Diagnosis not present

## 2022-06-28 DIAGNOSIS — D649 Anemia, unspecified: Secondary | ICD-10-CM | POA: Diagnosis not present

## 2022-06-28 LAB — BASIC METABOLIC PANEL
Anion gap: 7 (ref 5–15)
BUN: 30 mg/dL — ABNORMAL HIGH (ref 8–23)
CO2: 24 mmol/L (ref 22–32)
Calcium: 8.1 mg/dL — ABNORMAL LOW (ref 8.9–10.3)
Chloride: 110 mmol/L (ref 98–111)
Creatinine, Ser: 1.96 mg/dL — ABNORMAL HIGH (ref 0.44–1.00)
GFR, Estimated: 25 mL/min — ABNORMAL LOW (ref >60)
Glucose, Bld: 104 mg/dL — ABNORMAL HIGH (ref 70–99)
Potassium: 4.1 mmol/L (ref 3.5–5.1)
Sodium: 141 mmol/L (ref 135–145)

## 2022-06-28 LAB — CBC
HCT: 28.1 % — ABNORMAL LOW (ref 36.0–46.0)
Hemoglobin: 8.2 g/dL — ABNORMAL LOW (ref 12.0–15.0)
MCH: 27.2 pg (ref 26.0–34.0)
MCHC: 29.2 g/dL — ABNORMAL LOW (ref 30.0–36.0)
MCV: 93.4 fL (ref 80.0–100.0)
Platelets: 201 10*3/uL (ref 150–400)
RBC: 3.01 MIL/uL — ABNORMAL LOW (ref 3.87–5.11)
RDW: 20 % — ABNORMAL HIGH (ref 11.5–15.5)
WBC: 7.2 10*3/uL (ref 4.0–10.5)
nRBC: 0 % (ref 0.0–0.2)

## 2022-06-28 LAB — GLUCOSE, CAPILLARY
Glucose-Capillary: 99 mg/dL (ref 70–99)
Glucose-Capillary: 118 mg/dL — ABNORMAL HIGH (ref 70–99)
Glucose-Capillary: 125 mg/dL — ABNORMAL HIGH (ref 70–99)
Glucose-Capillary: 87 mg/dL (ref 70–99)
Glucose-Capillary: 137 mg/dL — ABNORMAL HIGH (ref 70–99)

## 2022-06-28 MED ORDER — POLYETHYLENE GLYCOL 3350 17 G PO PACK
17.0000 g | PACK | Freq: Once | ORAL | Status: AC
  Administered 2022-06-28: 17 g via ORAL
  Filled 2022-06-28: qty 1

## 2022-06-28 NOTE — Care Management Important Message (Signed)
Important Message  Patient Details  Name: Melissa Christian MRN: [id number] Date of Birth: 1941/03/09   Medicare Important Message Given:  Yes     Juliann Pulse A Ishmail Mcmanamon 06/28/2022, 11:38 AM

## 2022-06-28 NOTE — Progress Notes (Signed)
[facility] Progress Note  Patient: Melissa Christian    ENM:[id number]  DOA: 06/25/2022    Date of Service: the patient was seen and examined on 06/28/2022  Brief hospital course: 81 year old female with past medical history of morbid obesity, CAD, diabetes mellitus, stage IV CKD and hypertension who presented to the emergency room on 12/23 for progressively worsening shortness of breath.  Patient related that for the past few months, she is had dyspnea on exertion and noted to have dark tarry stools.  She had received several iron infusions with the most recent one being 12/22.  Initially had been resistant to having this worked up, but has been more amenable over the past few weeks.  In the emergency room, patient found to have a hemoglobin of 6.7 which was down from 8.0 11 days prior.  Patient was transfused 1 unit packed red blood cells which improved her hemoglobin that evening to 7.5, but by following morning, hemoglobin down to 6.2.  Blood pressure and heart rate remained stable.  Patient transfused 2 more units packed red blood cells.  Gastroenterology consulted and took patient for EGD and colonoscopy on afternoon of 12/24, both of which were unremarkable.  Patient subsequently went for capsule endoscopy and currently waiting for capsule to be passed.   Assessment and Plan: * ABLA (acute blood loss anemia) Status post 3 units packed red blood cells total.  I do not think that she had worsening bleeding when she first came in but rather this is more from hemoconcentration initially due to blood loss with more accurate readings on 12/24 morning.  Hemoglobin slightly improved from previous day.  Patient status post unremarkable EGD and colonoscopy.  Status post capsule endoscopy, now waiting for capsule to pass.  No bowel movement since capsule was placed on 12/24.  Have ordered 1 dose of MiraLAX.  Symptomatic anemia Treatment as outlined in 1  CKD stage 4 due to type 2 diabetes mellitus  ([place]) Patient has type 2 diabetes mellitus with complications of stage IV chronic kidney disease.  Renal function at baseline Hold Actos and Trulicity Glycemic control with sliding scale insulin Monitor renal function closely  CAD (coronary artery disease) Hold aspirin due to acute blood loss anemia Hold metoprolol for now since patient is normotensive  Morbid obesity due to excess calories [facility]) Patient meets criteria with BMI greater than 40  HTN (hypertension) Hold all antihypertensive medications for now since patient is normotensive       Body mass index is 42.91 kg/m.        Consultants: Gastroenterology  Procedures: Blood transfusions on 12/23 and 12/24 EGD unremarkable other than 1 duodenal polyp and several gastric polyps which were removed Colonoscopy unremarkable Capsule endoscopy with results pending  Antimicrobials: None  Code Status: Full code   Subjective: Patient okay, no complaints  Objective: Vital signs were reviewed and unremarkable. Vitals:   06/28/22 0418 06/28/22 0738  BP: (!) 116/44 (!) 132/47  Pulse: 90 88  Resp: 19 20  Temp: 99.7 F (37.6 C) 98.6 F (37 C)  SpO2: 99% 100%    Intake/Output Summary (Last 24 hours) at 06/28/2022 1114 Last data filed at 06/28/2022 1050 Gross per 24 hour  Intake 483 ml  Output 400 ml  Net 83 ml    Filed Weights   06/25/22 1046 06/25/22 1047  Weight: 113.4 kg 113.4 kg   Body mass index is 42.91 kg/m.  Exam:  General: Alert and oriented x 3, no acute distress HEENT: Normocephalic,  atraumatic, mucous membranes are moist Cardiovascular: Regular rate and rhythm, S1-S2 Respiratory: Clear to auscultation bilaterally Abdomen: Soft, nontender, nondistended, positive bowel sounds Musculoskeletal: No clubbing or cyanosis, trace pitting edema Skin: No skin breaks, tears or lesions, other than fever blister underneath her right nares Psychiatry: Appropriate, no evidence of psychoses Neurology:  No focal deficits  Data Reviewed: Hemoglobin at 8.2.  Creatinine 1.96  Disposition:  Status is: Inpatient Remains inpatient appropriate because:  -Results of capsule endoscopy    Anticipated discharge date: 12/27   Family Communication: Will call family DVT Prophylaxis: SCDs    Author: Annita Brod ,MD 06/28/2022 11:14 AM  To reach On-call, see care teams to locate the attending and reach out via [email or web address]. Between 7PM-7AM, please contact night-coverage If you still have difficulty reaching the attending provider, please page the [facility] (Director on Call) for [facility] on amion for assistance.

## 2022-06-29 ENCOUNTER — Inpatient Hospital Stay: Payer: Medicare HMO

## 2022-06-29 ENCOUNTER — Ambulatory Visit: Payer: Medicare HMO

## 2022-06-29 DIAGNOSIS — K921 Melena: Secondary | ICD-10-CM | POA: Diagnosis not present

## 2022-06-29 DIAGNOSIS — D649 Anemia, unspecified: Secondary | ICD-10-CM | POA: Diagnosis not present

## 2022-06-29 DIAGNOSIS — D62 Acute posthemorrhagic anemia: Secondary | ICD-10-CM | POA: Diagnosis not present

## 2022-06-29 DIAGNOSIS — E1122 Type 2 diabetes mellitus with diabetic chronic kidney disease: Secondary | ICD-10-CM | POA: Diagnosis not present

## 2022-06-29 LAB — TYPE AND SCREEN
ABO/RH(D): A POS
Antibody Screen: POSITIVE
Donor AG Type: NEGATIVE
Donor AG Type: NEGATIVE
Donor AG Type: NEGATIVE
Unit division: 0
Unit division: 0
Unit division: 0
Unit division: 0
Unit division: 0

## 2022-06-29 LAB — BPAM RBC
Blood Product Expiration Date: 202401072359
Blood Product Expiration Date: 202401102359
Blood Product Expiration Date: 202401102359
Blood Product Expiration Date: 202401102359
Blood Product Expiration Date: 202401262359
ISSUE DATE / TIME: 202312231015
ISSUE DATE / TIME: 202312240859
ISSUE DATE / TIME: 202312241133
Unit Type and Rh: 5100
Unit Type and Rh: 5100
Unit Type and Rh: 5100
Unit Type and Rh: 6200
Unit Type and Rh: 6200

## 2022-06-29 LAB — GLUCOSE, CAPILLARY
Glucose-Capillary: 91 mg/dL (ref 70–99)
Glucose-Capillary: 127 mg/dL — ABNORMAL HIGH (ref 70–99)
Glucose-Capillary: 136 mg/dL — ABNORMAL HIGH (ref 70–99)
Glucose-Capillary: 114 mg/dL — ABNORMAL HIGH (ref 70–99)

## 2022-06-29 LAB — CBC
HCT: 30.5 % — ABNORMAL LOW (ref 36.0–46.0)
Hemoglobin: 8.8 g/dL — ABNORMAL LOW (ref 12.0–15.0)
MCH: 27 pg (ref 26.0–34.0)
MCHC: 28.9 g/dL — ABNORMAL LOW (ref 30.0–36.0)
MCV: 93.6 fL (ref 80.0–100.0)
Platelets: 193 10*3/uL (ref 150–400)
RBC: 3.26 MIL/uL — ABNORMAL LOW (ref 3.87–5.11)
RDW: 19.8 % — ABNORMAL HIGH (ref 11.5–15.5)
WBC: 6.4 10*3/uL (ref 4.0–10.5)
nRBC: 0 % (ref 0.0–0.2)

## 2022-06-29 LAB — SURGICAL PATHOLOGY

## 2022-06-29 MED ORDER — BISACODYL 10 MG RE SUPP: 10.0000 mg | Freq: Once | RECTAL | Status: DC

## 2022-06-29 MED ORDER — PANTOPRAZOLE SODIUM 40 MG PO TBEC
40.0000 mg | DELAYED_RELEASE_TABLET | Freq: Every day | ORAL | Status: DC
  Administered 2022-06-29 – 2022-07-01 (×3): 40 mg via ORAL
  Filled 2022-06-29 (×3): qty 1

## 2022-06-29 MED ORDER — LACTULOSE 10 GM/15ML PO SOLN
10.0000 g | ORAL | Status: AC
  Administered 2022-06-29: 10 g via ORAL
  Filled 2022-06-29: qty 30

## 2022-06-29 MED ORDER — POLYETHYLENE GLYCOL 3350 17 GM/SCOOP PO POWD
0.5000 | Freq: Once | ORAL | Status: AC
  Administered 2022-06-29: 127.5 g via ORAL
  Filled 2022-06-29: qty 255

## 2022-06-29 NOTE — Progress Notes (Signed)
Miralax 0.5 container stared at 1600. Bed pan is kept at bed side

## 2022-06-29 NOTE — Progress Notes (Signed)
[facility] Progress Note  Patient: Melissa Christian    ZDG:[id number]  DOA: 06/25/2022    Date of Service: the patient was seen and examined on 06/29/2022  Brief hospital course: 81 year old female with past medical history of morbid obesity, CAD, diabetes mellitus, stage IV CKD and hypertension who presented to the emergency room on 12/23 for progressively worsening shortness of breath.  Patient related that for the past few months, she is had dyspnea on exertion and noted to have dark tarry stools.  She had received several iron infusions with the most recent one being 12/22.  Initially had been resistant to having this worked up, but has been more amenable over the past few weeks.  In the emergency room, patient found to have a hemoglobin of 6.7 which was down from 8.0 11 days prior.  Patient was transfused 1 unit packed red blood cells which improved her hemoglobin that evening to 7.5, but by following morning, hemoglobin down to 6.2.  Blood pressure and heart rate remained stable.  Patient transfused 2 more units packed red blood cells.  Gastroenterology consulted and took patient for EGD and colonoscopy on afternoon of 12/24, both of which were unremarkable.  Patient subsequently went for capsule endoscopy after, unfortunately, So has not yet passed.  GI plans to take patient for repeat EGD tomorrow with placement of capsule and small bowel.   Assessment and Plan: * ABLA (acute blood loss anemia) Status post 3 units packed red blood cells total.  I do not think that she had worsening bleeding when she first came in but rather this is more from hemoconcentration initially due to blood loss with more accurate readings on 12/24 morning.  Hemoglobin slightly improved from previous day.  Patient status post unremarkable EGD and colonoscopy.  Status post capsule endoscopy, but capsule has not yet passed/patient has not yet had another bowel movement.  Plan is for repeat EGD in the morning with  placement of capsule in small bowel.  Symptomatic anemia Treatment as outlined in 1  CKD stage 4 due to type 2 diabetes mellitus ([place]) Patient has type 2 diabetes mellitus with complications of stage IV chronic kidney disease.  Renal function at baseline Hold Actos and Trulicity Glycemic control with sliding scale insulin Monitor renal function closely  CAD (coronary artery disease) Hold aspirin due to acute blood loss anemia Hold metoprolol for now since patient is normotensive  Morbid obesity due to excess calories [facility]) Patient meets criteria with BMI greater than 40  HTN (hypertension) Hold all antihypertensive medications for now since patient is normotensive       Body mass index is 42.91 kg/m.        Consultants: Gastroenterology  Procedures: Blood transfusions on 12/23 and 12/24 EGD unremarkable other than 1 duodenal polyp and several gastric polyps which were removed Colonoscopy unremarkable Capsule endoscopy Repeat EGD planned 12/28 with capsule placement  Antimicrobials: None  Code Status: Full code   Subjective: Patient okay, no complaints  Objective: Vital signs were reviewed and unremarkable. Vitals:   06/29/22 0525 06/29/22 0735  BP: (!) 154/54 (!) 136/55  Pulse: 85 86  Resp: 18 16  Temp: 98.5 F (36.9 C) 98.5 F (36.9 C)  SpO2: 100% 98%    Intake/Output Summary (Last 24 hours) at 06/29/2022 1310 Last data filed at 06/29/2022 0834 Gross per 24 hour  Intake 486 ml  Output 200 ml  Net 286 ml    Filed Weights   06/25/22 1046 06/25/22 1047  Weight:  113.4 kg 113.4 kg   Body mass index is 42.91 kg/m.  Exam:  General: Alert and oriented x 3, no acute distress HEENT: Normocephalic, atraumatic, mucous membranes are moist Cardiovascular: Regular rate and rhythm, S1-S2 Respiratory: Clear to auscultation bilaterally Abdomen: Soft, nontender, nondistended, positive bowel sounds Musculoskeletal: No clubbing or cyanosis, trace  pitting edema Skin: No skin breaks, tears or lesions, other than fever blister underneath her right nares Psychiatry: Appropriate, no evidence of psychoses Neurology: No focal deficits  Data Reviewed: Hemoglobin increased to 8.8  Disposition:  Status is: Inpatient Remains inpatient appropriate because:  -Repeat EGD for placement of capsule     Anticipated discharge date: 12/29   Family Communication: Will call family DVT Prophylaxis: SCDs    Author: Annita Brod ,MD 06/29/2022 1:10 PM  To reach On-call, see care teams to locate the attending and reach out via [email or web address]. Between 7PM-7AM, please contact night-coverage If you still have difficulty reaching the attending provider, please page the [facility] (Director on Call) for [facility] on amion for assistance.

## 2022-06-29 NOTE — Progress Notes (Signed)
Melissa Darby, MD [address]  [place]  [place], [place] [address]  Main: [phone]  Fax: [phone] Pager: [phone]   Subjective: No acute events overnight.  No further episodes of melena.  Hemoglobin has been stable.  Patient wants to know if she passed the capsule   Objective: Vital signs in last 24 hours: Vitals:   06/28/22 2111 06/29/22 0120 06/29/22 0525 06/29/22 0735  BP: (!) 153/57 (!) 141/45 (!) 154/54 (!) 136/55  Pulse: 83 77 85 86  Resp: '18 18 18 16  '$ Temp: (!) 97.5 F (36.4 C) 98.6 F (37 C) 98.5 F (36.9 C) 98.5 F (36.9 C)  TempSrc: Oral  Oral   SpO2: 100% 99% 100% 98%  Weight:      Height:       Weight change:   Intake/Output Summary (Last 24 hours) at 06/29/2022 1209 Last data filed at 06/29/2022 0834 Gross per 24 hour  Intake 486 ml  Output 200 ml  Net 286 ml     Exam: Heart:: Regular rate and rhythm, S1S2 present, or without murmur or extra heart sounds Lungs: normal and clear to auscultation Abdomen: soft, nontender, normal bowel sounds   Lab Results:    Latest Ref Rng & Units 06/29/2022    5:19 AM 06/28/2022    6:27 AM 06/27/2022    9:09 AM  CBC  WBC 4.0 - 10.5 K/uL 6.4  7.2  8.5   Hemoglobin 12.0 - 15.0 g/dL 8.8  8.2  8.0   Hematocrit 36.0 - 46.0 % 30.5  28.1  27.8   Platelets 150 - 400 K/uL 193  201  228       Latest Ref Rng & Units 06/28/2022    6:27 AM 06/27/2022    9:09 AM 06/26/2022    5:30 AM  CMP  Glucose 70 - 99 mg/dL 104  106  86   BUN 8 - 23 mg/dL 30  32  33   Creatinine 0.44 - 1.00 mg/dL 1.96  1.93  1.74   Sodium 135 - 145 mmol/L 141  140  141   Potassium 3.5 - 5.1 mmol/L 4.1  4.1  3.8   Chloride 98 - 111 mmol/L 110  108  110   CO2 22 - 32 mmol/L '24  25  26   '$ Calcium 8.9 - 10.3 mg/dL 8.1  8.0  8.0     Micro Results: No results found for this or any previous visit (from the past 240 hour(s)). Studies/Results: DG Abd 1 View  Result Date: 06/29/2022 CLINICAL DATA:  Medial capsule study.  EXAM: ABDOMEN - 1 VIEW COMPARISON:  CT abdomen pelvis 04/15/2022. FINDINGS: There are 4 images of the abdomen, only 1 of which shows the capsule in the lateral left mid abdomen, possibly within the descending colon. Fair amount of stool in the colon. Visualized lung bases of show small bilateral pleural effusions. Postoperative changes in the lumbosacral spine. Right hip arthroplasty. Surgical clips in the right upper quadrant. IMPRESSION: 1. Radiopaque capsule device is in the lateral left mid abdomen, possibly within descending colon. CT abdomen pelvis would be confirmatory. 2. Fair amount of stool in the colon indicative of constipation. 3. Small bilateral pleural effusions. Electronically Signed   By: Lorin Picket M.D.   On: 06/29/2022 11:41   Medications: I have reviewed the patient's current medications. Prior to Admission:  Medications Prior to Admission  Medication Sig Dispense Refill Last Dose   aspirin EC 81 MG tablet Take  81 mg by mouth daily.   06/24/2022   Cholecalciferol (VITAMIN D3) 2000 units TABS Take 2,000 Units by mouth daily.   06/24/2022   doxazosin (CARDURA) 2 MG tablet Take 1 tablet (2 mg total) by mouth 2 (two) times daily. 180 tablet 3 06/24/2022   furosemide (LASIX) 40 MG tablet Take 1 tablet (40 mg total) by mouth daily. 90 tablet 3 06/24/2022   metoprolol succinate (TOPROL-XL) 100 MG 24 hr tablet Take 1 tablet in the morning and 1/2 tablet in the evening. (Patient taking differently: Take 50-100 mg by mouth 2 (two) times daily. Take 1 tablet in the morning and 1/2 tablet in the evening.) 126 tablet 3 06/24/2022   pioglitazone (ACTOS) 30 MG tablet Take 30 mg by mouth daily.    06/24/2022   rosuvastatin (CRESTOR) 40 MG tablet Take 1 tablet (40 mg total) by mouth daily. 90 tablet 3 06/24/2022   triamterene-hydrochlorothiazide (DYAZIDE) 37.5-25 MG capsule Take 1 each (1 capsule total) by mouth every other day. 90 capsule 3 06/24/2022   B Complex-C-Folic Acid (RENA-VITE RX) 1  MG TABS Take 1 tablet by mouth daily. (Patient not taking: Reported on 06/25/2022)   Not Taking   cyanocobalamin (VITAMIN B12) 1000 MCG/ML injection Inject 1,000 mcg into the muscle every 14 (fourteen) days. (Patient not taking: Reported on 06/25/2022)   Not Taking   IV Sets-Tubing (INFUSION SET) MISC by Does not apply route. Iron infusion weekly at facility      TRULICITY 1.5 IE/3.3IR SOPN INJECT 1.5 MG SUBCUTANEOUSLY EVERY 7 (SEVEN) DAYS   06/12/2022   Scheduled:  insulin aspart  0-15 Units Subcutaneous TID WC   pantoprazole  40 mg Oral Daily   sodium chloride flush  3 mL Intravenous Q12H   Continuous:  sodium chloride     sodium chloride 10 mL/hr at 06/25/22 1600   JJO:ACZYSA chloride, ondansetron **OR** ondansetron (ZOFRAN) IV, sodium chloride flush Anti-infectives (From admission, onward)    None      Scheduled Meds:  insulin aspart  0-15 Units Subcutaneous TID WC   pantoprazole  40 mg Oral Daily   sodium chloride flush  3 mL Intravenous Q12H   Continuous Infusions:  sodium chloride     sodium chloride 10 mL/hr at 06/25/22 1600   PRN Meds:.sodium chloride, ondansetron **OR** ondansetron (ZOFRAN) IV, sodium chloride flush   Assessment: Principal Problem:   ABLA (acute blood loss anemia) Active Problems:   HTN (hypertension)   Morbid obesity due to excess calories (HCC)   CAD (coronary artery disease)   Symptomatic anemia   CKD stage 4 due to type 2 diabetes mellitus ([place])   Plan: Acute on chronic iron deficiency anemia with melena EGD and colonoscopy were unremarkable except for large duodenal polyp that has not been removed Video capsule was retained in stomach.  KUB from today revealed capsule passed small bowel Discussed with patient regarding EGD with placement of capsule in small bowel and she agreed to proceed with it tomorrow N.p.o. effective 5 AM Recommend clear liquid diet today and half the amount of MiraLAX bowel prep today  I have discussed  alternative options, risks & benefits,  which include, but are not limited to, bleeding, infection, perforation,respiratory complication & drug reaction.  The patient agrees with this plan & written consent will be obtained.      LOS: 4 days   [name] 06/29/2022, 12:09 PM

## 2022-06-29 NOTE — Progress Notes (Signed)
Dr Virgina Jock  One of your patients who is scheduled for a procedure tomorrow was admitted I did her EGD and colonoscopy for melena large lateral spreading polyp in the third portion of the duodenum biopsies suggests tubular adenoma, the pictures are pretty impressive.  Once she is discharged you may have to discuss with her options  Regards  Bailey Mech

## 2022-06-29 NOTE — Progress Notes (Signed)
Patient was taken for the Xray with the bed.

## 2022-06-29 NOTE — Progress Notes (Signed)
Patient is back to the floor after Xray.

## 2022-06-30 ENCOUNTER — Encounter
Admission: EM | Disposition: A | Discharge status: Home / Self Care | Payer: Self-pay | Source: Home / Self Care | Attending: Internal Medicine

## 2022-06-30 ENCOUNTER — Inpatient Hospital Stay: Payer: Medicare HMO | Admitting: Anesthesiology

## 2022-06-30 ENCOUNTER — Inpatient Hospital Stay: Admission: RE | Admit: 2022-06-30 | Payer: Medicare HMO | Source: Home / Self Care | Admitting: Gastroenterology

## 2022-06-30 ENCOUNTER — Encounter: Payer: Self-pay | Admitting: Gastroenterology

## 2022-06-30 DIAGNOSIS — D62 Acute posthemorrhagic anemia: Secondary | ICD-10-CM | POA: Diagnosis not present

## 2022-06-30 DIAGNOSIS — K921 Melena: Secondary | ICD-10-CM | POA: Diagnosis not present

## 2022-06-30 DIAGNOSIS — I10 Essential (primary) hypertension: Secondary | ICD-10-CM | POA: Diagnosis not present

## 2022-06-30 DIAGNOSIS — D649 Anemia, unspecified: Secondary | ICD-10-CM | POA: Diagnosis not present

## 2022-06-30 DIAGNOSIS — E1122 Type 2 diabetes mellitus with diabetic chronic kidney disease: Secondary | ICD-10-CM | POA: Diagnosis not present

## 2022-06-30 DIAGNOSIS — D500 Iron deficiency anemia secondary to blood loss (chronic): Secondary | ICD-10-CM | POA: Diagnosis not present

## 2022-06-30 HISTORY — PX: ESOPHAGOGASTRODUODENOSCOPY: SHX5428

## 2022-06-30 HISTORY — PX: GIVENS CAPSULE STUDY: SHX5432

## 2022-06-30 LAB — BASIC METABOLIC PANEL
Anion gap: 3 — ABNORMAL LOW (ref 5–15)
BUN: 22 mg/dL (ref 8–23)
CO2: 27 mmol/L (ref 22–32)
Calcium: 8 mg/dL — ABNORMAL LOW (ref 8.9–10.3)
Chloride: 110 mmol/L (ref 98–111)
Creatinine, Ser: 1.37 mg/dL — ABNORMAL HIGH (ref 0.44–1.00)
GFR, Estimated: 39 mL/min — ABNORMAL LOW (ref >60)
Glucose, Bld: 112 mg/dL — ABNORMAL HIGH (ref 70–99)
Potassium: 4.4 mmol/L (ref 3.5–5.1)
Sodium: 140 mmol/L (ref 135–145)

## 2022-06-30 LAB — GLUCOSE, CAPILLARY
Glucose-Capillary: 93 mg/dL (ref 70–99)
Glucose-Capillary: 99 mg/dL (ref 70–99)
Glucose-Capillary: 101 mg/dL — ABNORMAL HIGH (ref 70–99)
Glucose-Capillary: 150 mg/dL — ABNORMAL HIGH (ref 70–99)

## 2022-06-30 LAB — CBC
HCT: 29.2 % — ABNORMAL LOW (ref 36.0–46.0)
Hemoglobin: 8.4 g/dL — ABNORMAL LOW (ref 12.0–15.0)
MCH: 27 pg (ref 26.0–34.0)
MCHC: 28.8 g/dL — ABNORMAL LOW (ref 30.0–36.0)
MCV: 93.9 fL (ref 80.0–100.0)
Platelets: 169 10*3/uL (ref 150–400)
RBC: 3.11 MIL/uL — ABNORMAL LOW (ref 3.87–5.11)
RDW: 19.3 % — ABNORMAL HIGH (ref 11.5–15.5)
WBC: 5.9 10*3/uL (ref 4.0–10.5)
nRBC: 0 % (ref 0.0–0.2)

## 2022-06-30 SURGERY — EGD (ESOPHAGOGASTRODUODENOSCOPY)
Anesthesia: General

## 2022-06-30 MED ORDER — FUROSEMIDE 40 MG PO TABS
40.0000 mg | ORAL_TABLET | Freq: Every day | ORAL | Status: DC
  Administered 2022-06-30 – 2022-07-01 (×2): 40 mg via ORAL
  Filled 2022-06-30 (×2): qty 1

## 2022-06-30 MED ORDER — PHENYLEPHRINE HCL (PRESSORS) 10 MG/ML IV SOLN
INTRAVENOUS | Status: DC | PRN
  Administered 2022-06-30: 80 ug via INTRAVENOUS

## 2022-06-30 MED ORDER — NYSTATIN 100000 UNIT/GM EX POWD
Freq: Two times a day (BID) | CUTANEOUS | Status: DC
  Administered 2022-06-30: 1 via TOPICAL
  Filled 2022-06-30: qty 15

## 2022-06-30 MED ORDER — LIDOCAINE HCL (CARDIAC) PF 100 MG/5ML IV SOSY
PREFILLED_SYRINGE | INTRAVENOUS | Status: DC | PRN
  Administered 2022-06-30: 100 mg via INTRAVENOUS

## 2022-06-30 MED ORDER — PROPOFOL 10 MG/ML IV BOLUS
INTRAVENOUS | Status: DC | PRN
  Administered 2022-06-30 (×2): 20 mg via INTRAVENOUS
  Administered 2022-06-30: 70 mg via INTRAVENOUS
  Administered 2022-06-30: 20 mg via INTRAVENOUS

## 2022-06-30 MED ORDER — DOXAZOSIN MESYLATE 2 MG PO TABS
2.0000 mg | ORAL_TABLET | Freq: Two times a day (BID) | ORAL | Status: DC
  Administered 2022-06-30 – 2022-07-01 (×2): 2 mg via ORAL
  Filled 2022-06-30 (×2): qty 1

## 2022-06-30 MED ORDER — ROSUVASTATIN CALCIUM 20 MG PO TABS
40.0000 mg | ORAL_TABLET | Freq: Every day | ORAL | Status: DC
  Administered 2022-06-30 – 2022-07-01 (×2): 40 mg via ORAL
  Filled 2022-06-30 (×2): qty 2

## 2022-06-30 NOTE — Progress Notes (Signed)
[facility] Progress Note  Patient: Melissa Christian    QMG:[id number]  DOA: 06/25/2022    Date of Service: the patient was seen and examined on 06/30/2022  Brief hospital course: 81 year old female with past medical history of morbid obesity, CAD, diabetes mellitus, stage IV CKD and hypertension who presented to the emergency room on 12/23 for progressively worsening shortness of breath.  Patient related that for the past few months, she is had dyspnea on exertion and noted to have dark tarry stools.  She had received several iron infusions with the most recent one being 12/22.  Initially had been resistant to having this worked up, but has been more amenable over the past few weeks.  In the emergency room, patient found to have a hemoglobin of 6.7 which was down from 8.0 11 days prior.  Patient was transfused 1 unit packed red blood cells which improved her hemoglobin that evening to 7.5, but by following morning, hemoglobin down to 6.2.  Blood pressure and heart rate remained stable.  Patient transfused 2 more units packed red blood cells.  Gastroenterology consulted and took patient for EGD and colonoscopy on afternoon of 12/24, both of which were unremarkable.  Patient subsequently went for capsule endoscopy after, unfortunately, capsule did not pass.  GI took patient for repeat EGD 12/28 with placement of capsule in small bowel.   Assessment and Plan: * ABLA (acute blood loss anemia) Status post 3 units packed red blood cells total.  I do not think that she had worsening bleeding when she first came in but rather this is more from hemoconcentration initially due to blood loss with more accurate readings on 12/24 morning.  Hemoglobin slightly improved from previous day.  Patient status post unremarkable EGD and colonoscopy.  Status post capsule endoscopy, but capsule has not yet passed/patient has not yet had another bowel movement.  Repeat EGD done 12/28 with placement of capsule in small  bowel.  Symptomatic anemia Treatment as outlined in 1  CKD stage 4 due to type 2 diabetes mellitus ([place]) Patient has type 2 diabetes mellitus with complications of stage IV chronic kidney disease.  Renal function at baseline Hold Actos and Trulicity Glycemic control with sliding scale insulin Monitor renal function closely  CAD (coronary artery disease) Hold aspirin due to acute blood loss anemia Hold metoprolol for now since patient is normotensive  Morbid obesity due to excess calories [facility]) Patient meets criteria with BMI greater than 40  HTN (hypertension) Initially held antihypertensive medications as patient was normotensive, blood pressure has been trending up, so we will restart home meds       Body mass index is 42.91 kg/m.        Consultants: Gastroenterology  Procedures: Blood transfusions on 12/23 and 12/24 EGD unremarkable other than 1 duodenal polyp and several gastric polyps which were removed Colonoscopy unremarkable Capsule endoscopy Repeat EGD 12/28 with capsule placement  Antimicrobials: None  Code Status: Full code   Subjective: Patient okay, no complaints  Objective: Vital signs were reviewed and unremarkable. Vitals:   06/30/22 1309 06/30/22 1319  BP: (!) 150/56 (!) 158/55  Pulse: 84 82  Resp: 20 19  Temp:    SpO2: 100% 100%    Intake/Output Summary (Last 24 hours) at 06/30/2022 1621 Last data filed at 06/30/2022 0116 Gross per 24 hour  Intake 3 ml  Output 1700 ml  Net -1697 ml    Filed Weights   06/25/22 1046 06/25/22 1047  Weight: 113.4 kg 113.4 kg  Body mass index is 42.91 kg/m.  Exam:  General: Alert and oriented x 3, no acute distress HEENT: Normocephalic, atraumatic, mucous membranes are moist Cardiovascular: Regular rate and rhythm, S1-S2 Respiratory: Clear to auscultation bilaterally Abdomen: Soft, nontender, nondistended, positive bowel sounds Musculoskeletal: No clubbing or cyanosis, trace pitting  edema Skin: No skin breaks, tears or lesions, other than fever blister underneath her right nares Psychiatry: Appropriate, no evidence of psychoses Neurology: No focal deficits  Data Reviewed: Hemoglobin at 8.4 Creatinine 1.37 with GFR of 39  Disposition:  Status is: Inpatient Remains inpatient appropriate because:  Retrieval of second capsule    Anticipated discharge date: 12/29   Family Communication: Family at bedside DVT Prophylaxis: SCDs    Author: Annita Brod ,MD 06/30/2022 4:21 PM  To reach On-call, see care teams to locate the attending and reach out via [email or web address]. Between 7PM-7AM, please contact night-coverage If you still have difficulty reaching the attending provider, please page the [facility] (Director on Call) for [facility] on amion for assistance.

## 2022-06-30 NOTE — Transfer of Care (Signed)
Immediate Anesthesia Transfer of Care Note  Patient: Melissa Christian  Procedure(s) Performed: ESOPHAGOGASTRODUODENOSCOPY (EGD) GIVENS CAPSULE STUDY  Patient Location: PACU and Endoscopy Unit  Anesthesia Type:General  Level of Consciousness: awake  Airway & Oxygen Therapy: Patient Spontanous Breathing, o2 via [place]. wheezing  Post-op Assessment: Report given to RN and Post -op Vital signs reviewed and stable  Post vital signs: Reviewed and stable  Last Vitals:  Vitals Value Taken Time  BP 95/79 06/30/22 1259  Temp 36.1 C 06/30/22 1259  Pulse 84 06/30/22 1259  Resp 24 06/30/22 1259  SpO2 91 % 06/30/22 1259    Last Pain:  Vitals:   06/30/22 1259  TempSrc: Temporal  PainSc: 0-No pain         Complications: No notable events documented.

## 2022-06-30 NOTE — Anesthesia Preprocedure Evaluation (Signed)
Anesthesia Evaluation  Patient identified by MRN, date of birth, ID band Patient awake    Reviewed: Allergy & Precautions, NPO status , Patient's Chart, lab work & pertinent test results  History of Anesthesia Complications (+) PONV and history of anesthetic complications  Airway Mallampati: IV   Neck ROM: Full    Dental  (+) Upper Dentures, Lower Dentures   Pulmonary neg pulmonary ROS   Pulmonary exam normal breath sounds clear to auscultation       Cardiovascular hypertension, + CAD, + Past MI and + Cardiac Stents  Normal cardiovascular exam Rhythm:Regular Rate:Normal  ECG 06/25/22:  Sinus rhythm with 1st degree A-V block Left axis deviation Right bundle branch block Inferior infarct (cited on or before 11-Jun-2020) Anterolateral infarct (cited on or before 11-Jun-2020)  Echo 07/25/19: NORMAL LEFT VENTRICULAR SYSTOLIC FUNCTION  NORMAL RIGHT VENTRICULAR SYSTOLIC FUNCTION  MILD VALVULAR REGURGITATION  MILD VALVULAR STENOSIS  A flutter seen on telemetry    Neuro/Psych negative neurological ROS     GI/Hepatic negative GI ROS,,,  Endo/Other  diabetes, Poorly Controlled, Type 2  Class 3 obesity  Renal/GU Renal disease (stage IV CKD)     Musculoskeletal   Abdominal   Peds  Hematology  (+) Blood dyscrasia, anemia   Anesthesia Other Findings Cardiology note 02/04/22:  Coronary artery disease involving native coronary artery of native heart with unstable angina pectoris ([place]) - Plan: EKG 12-Lead Currently with no symptoms of angina. No further workup at this time. Continue current medication regimen.   Mixed hyperlipidemia - Plan: EKG 12-Lead Cholesterol slightly above goal, recommended diet for weight loss, unable to exercise.  In follow-up could add Zetia   Essential hypertension - Plan: EKG 12-Lead She previously declined changing diltiazem but she is more receptive today as legs are more swollen Recommend she  stop the diltiazem Change to metoprolol succinate 100 in the morning 50 in the evening, start Cardura 2 mg twice daily, closely monitor blood pressure and call us with numbers Increase Lasix as below   Type 2 diabetes mellitus with other circulatory complication, without long-term current use of insulin (HCC) -  A1c 6.5, high end of her range Low carbohydrate diet recommended   Morbid obesity due to excess calories ([place]) - Plan: EKG 12-Lead We have encouraged careful diet management in an effort to lose weight.   Chronic diastolic CHF with pulmonary HTN Exacerbated by morbid obesity, Very sedentary, high fluid intake, some restaurant food Difficulty losing weight, up to 80 relatively immobile, sedentary Recommend she increase Lasix up to 40 daily Has lab work with Dr. Sabra Heck in several weeks time May need to change to torsemide if no improvement with Lasix 40 off diltiazem   Reproductive/Obstetrics                              Anesthesia Physical Anesthesia Plan  ASA: 3  Anesthesia Plan: General   Post-op Pain Management:    Induction: Intravenous  PONV Risk Score and Plan: 4 or greater and Propofol infusion, TIVA and Treatment may vary due to age or medical condition  Airway Management Planned: Natural Airway  Additional Equipment:   Intra-op Plan:   Post-operative Plan:   Informed Consent: I have reviewed the patients History and Physical, chart, labs and discussed the procedure including the risks, benefits and alternatives for the proposed anesthesia with the patient or authorized representative who has indicated his/her understanding and acceptance.  Plan Discussed with: CRNA  Anesthesia Plan Comments: (LMA/GETA backup discussed.  Patient consented for risks of anesthesia including but not limited to:  - adverse reactions to medications - damage to eyes, teeth, lips or other oral mucosa - nerve damage due to positioning  - sore  throat or hoarseness - damage to heart, brain, nerves, lungs, other parts of body or loss of life  Informed patient about role of CRNA in peri- and intra-operative care.  Patient voiced understanding.)         Anesthesia Quick Evaluation

## 2022-06-30 NOTE — Anesthesia Postprocedure Evaluation (Signed)
Anesthesia Post Note  Patient: Melissa Christian  Procedure(s) Performed: ESOPHAGOGASTRODUODENOSCOPY (EGD) GIVENS CAPSULE STUDY  Patient location during evaluation: Endoscopy Anesthesia Type: General Level of consciousness: awake and alert Pain management: pain level controlled Vital Signs Assessment: post-procedure vital signs reviewed and stable Respiratory status: spontaneous breathing, nonlabored ventilation, respiratory function stable and patient connected to nasal cannula oxygen Cardiovascular status: blood pressure returned to baseline and stable Postop Assessment: no apparent nausea or vomiting Anesthetic complications: no  No notable events documented.   Last Vitals:  Vitals:   06/30/22 1309 06/30/22 1319  BP: (!) 150/56 (!) 158/55  Pulse: 84 82  Resp: 20 19  Temp:    SpO2: 100% 100%    Last Pain:  Vitals:   06/30/22 1319  TempSrc:   PainSc: 0-No pain                 Ilene Qua

## 2022-06-30 NOTE — Anesthesia Procedure Notes (Signed)
Procedure Name: MAC Date/Time: 06/30/2022 12:41 PM  Performed by: Biagio Borg, CRNAPre-anesthesia Checklist: Patient identified, Emergency Drugs available, Suction available, Patient being monitored and Timeout performed Patient Re-evaluated:Patient Re-evaluated prior to induction Oxygen Delivery Method: Simple face mask Induction Type: IV induction Placement Confirmation: positive ETCO2 and CO2 detector

## 2022-06-30 NOTE — Progress Notes (Signed)
Pt had a large liquid BM. No sign of colonoscopy capsule in stool.

## 2022-06-30 NOTE — Op Note (Signed)
[facility] Gastroenterology Patient Name: Sheyanne Munley Procedure Date: 06/30/2022 12:38 PM MRN: [id number] Account #: [id number] Date of Birth: January 07, 1941 Admit Type: Inpatient Age: 81 Room: [facility] ENDO ROOM 1 Gender: Female Note Status: Finalized Instrument Name: Upper Endoscope [id number] Procedure:             Upper GI endoscopy Indications:           Unexplained iron deficiency anemia, Gastrointestinal                         bleeding source not found during previous colonoscopy Providers:             Lin Landsman MD, MD Referring MD:          Rusty Aus, MD (Referring MD) Medicines:             General Anesthesia Complications:         No immediate complications. Estimated blood loss: None. Procedure:             Pre-Anesthesia Assessment:                        - Prior to the procedure, a History and Physical was                         performed, and patient medications and allergies were                         reviewed. The patient is competent. The risks and                         benefits of the procedure and the sedation options and                         risks were discussed with the patient. All questions                         were answered and informed consent was obtained.                         Patient identification and proposed procedure were                         verified by the physician, the nurse, the                         anesthesiologist, the anesthetist and the technician                         in the pre-procedure area in the procedure room in the                         endoscopy suite. Mental Status Examination: alert and                         oriented. Airway Examination: normal oropharyngeal                         airway and neck mobility. Respiratory Examination:  clear to auscultation. CV Examination: normal.                         Prophylactic Antibiotics: The patient does not require                          prophylactic antibiotics. Prior Anticoagulants: The                         patient has taken no anticoagulant or antiplatelet                         agents. ASA Grade Assessment: III - A patient with                         severe systemic disease. After reviewing the risks and                         benefits, the patient was deemed in satisfactory                         condition to undergo the procedure. The anesthesia                         plan was to use general anesthesia. Immediately prior                         to administration of medications, the patient was                         re-assessed for adequacy to receive sedatives. The                         heart rate, respiratory rate, oxygen saturations,                         blood pressure, adequacy of pulmonary ventilation, and                         response to care were monitored throughout the                         procedure. The physical status of the patient was                         re-assessed after the procedure.                        After obtaining informed consent, the endoscope was                         passed under direct vision. Throughout the procedure,                         the patient's blood pressure, pulse, and oxygen                         saturations were monitored continuously. The Endoscope  was introduced through the mouth, and advanced to the                         duodenal bulb. The upper GI endoscopy was accomplished                         without difficulty. The patient tolerated the                         procedure fairly well. Findings:      The examined duodenum was normal. Using the endoscope, the video capsule       enteroscope was advanced into the second portion of the duodenum. Impression:            - Normal examined duodenum.                        - Successful completion of the Video Capsule                          Enteroscope placement.                        - No specimens collected. Recommendation:        - Return patient to hospital ward for ongoing care.                        - Clear liquid diet today.                        - Continue present medications. Procedure Code(s):     --- Professional ---                        [phone], Esophagogastroduodenoscopy, flexible,                         transoral; diagnostic, including collection of                         specimen(s) by brushing or washing, when performed                         (separate procedure) Diagnosis Code(s):     --- Professional ---                        D50.9, Iron deficiency anemia, unspecified                        K92.2, Gastrointestinal hemorrhage, unspecified CPT copyright 2022 American Medical Association. All rights reserved. The codes documented in this report are preliminary and upon coder review may  be revised to meet current compliance requirements. Dr. Ulyess Mort Lin Landsman MD, MD 06/30/2022 12:54:56 PM This report has been signed electronically. Number of Addenda: 0 Note Initiated On: 06/30/2022 12:38 PM Estimated Blood Loss:  Estimated blood loss: none.      [facility]

## 2022-07-01 ENCOUNTER — Inpatient Hospital Stay: Payer: Medicare HMO

## 2022-07-01 ENCOUNTER — Encounter: Payer: Self-pay | Admitting: Gastroenterology

## 2022-07-01 ENCOUNTER — Telehealth: Payer: Self-pay | Admitting: Oncology

## 2022-07-01 DIAGNOSIS — D649 Anemia, unspecified: Secondary | ICD-10-CM | POA: Diagnosis not present

## 2022-07-01 DIAGNOSIS — D509 Iron deficiency anemia, unspecified: Secondary | ICD-10-CM

## 2022-07-01 DIAGNOSIS — Z8719 Personal history of other diseases of the digestive system: Secondary | ICD-10-CM

## 2022-07-01 DIAGNOSIS — D62 Acute posthemorrhagic anemia: Secondary | ICD-10-CM | POA: Diagnosis not present

## 2022-07-01 DIAGNOSIS — K635 Polyp of colon: Secondary | ICD-10-CM

## 2022-07-01 LAB — GLUCOSE, CAPILLARY
Glucose-Capillary: 78 mg/dL (ref 70–99)
Glucose-Capillary: 153 mg/dL — ABNORMAL HIGH (ref 70–99)

## 2022-07-01 LAB — HEMOGLOBIN A1C
Hgb A1c MFr Bld: 5.7 % — ABNORMAL HIGH (ref 4.8–5.6)
Mean Plasma Glucose: 117 mg/dL

## 2022-07-01 MED ORDER — PANTOPRAZOLE SODIUM 40 MG PO TBEC: 40.0000 mg | DELAYED_RELEASE_TABLET | Freq: Every day | ORAL | 1 refills | Status: AC

## 2022-07-01 MED ORDER — VITAMIN D 25 MCG (1000 UNIT) PO TABS
2000.0000 [IU] | ORAL_TABLET | Freq: Every day | ORAL | Status: DC
  Administered 2022-07-01: 2000 [IU] via ORAL
  Filled 2022-07-01: qty 2

## 2022-07-01 MED ORDER — MULTI-VITAMIN/MINERALS PO TABS: 1.0000 | ORAL_TABLET | Freq: Every day | ORAL | 2 refills | Status: AC

## 2022-07-01 MED ORDER — METOPROLOL SUCCINATE ER 25 MG PO TB24: 25.0000 mg | ORAL_TABLET | Freq: Every day | ORAL | 0 refills | Status: DC

## 2022-07-01 NOTE — Telephone Encounter (Signed)
Per [facility] request, MD ask to have pt appt moved to earlier. States that pt would be discharged soon. Req to call pt to notify, pt only has home number available phone rang out, unable to LVM

## 2022-07-01 NOTE — Discharge Summary (Signed)
Physician Discharge Summary   Patient: Melissa Christian MRN: [id number] DOB: 04-Mar-1941  Admit date:     06/25/2022  Discharge date: 07/01/22  Discharge Physician: Fritzi Mandes   PCP: Rusty Aus, MD   Recommendations at discharge:   follow-up Dr. Tasia Catchings at the cancer center for IV and infusion on January 4 at 3 PM follow-up PCP Dr. Emily Filbert in 1 to 2 weeks follow-up G.I. Dr. Virgina Jock in 2 to 3 weeks  Discharge Diagnoses: Principal Problem:   ABLA (acute blood loss anemia) Active Problems:   Symptomatic anemia   HTN (hypertension)   Morbid obesity due to excess calories (HCC)   CAD (coronary artery disease)   CKD stage 4 due to type 2 diabetes mellitus [facility])   Hospital Course: 81 year old female with past medical history of morbid obesity, CAD, diabetes mellitus, stage IV CKD and hypertension who presented to the emergency room on 12/23 for progressively worsening shortness of breath.  Patient related that for the past few months, she is had dyspnea on exertion and noted to have dark tarry stools.  She had received several iron infusions with the most recent one being 12/22.  Initially had been resistant to having this worked up, but has been more amenable over the past few weeks.  In the emergency room, patient found to have a hemoglobin of 6.7 which was down from 8.0 11 days prior.  Patient was transfused 1 unit packed red blood cells which improved her hemoglobin that evening to 7.5, but by following morning, hemoglobin down to 6.2.  Blood pressure and heart rate remained stable.  Patient transfused 2 more units packed red blood cells.  Gastroenterology consulted and took patient for EGD and colonoscopy on afternoon of 12/24, both of which were unremarkable.  Patient subsequently went for capsule endoscopy after, unfortunately, capsule did not pass.  GI took patient for repeat EGD 12/28 with placement of capsule in small bowel.  Assessment and Plan:   ABLA (acute blood loss  anemia) H/o anemia of chronic disease/Iron def anemia --Status post 3 units packed red blood cells total.    --Patient status post unremarkable EGD and colonoscopy.   --Status post capsule endoscopy, but capsule has not yet passed/patient has not yet had another bowel movement.   --Repeat EGD done 12/28 with placement of capsule in small bowel.  --per Dr Adline Mango study negative ofr any bleed  Symptomatic anemia Treatment as outlined in 1 -- patient gets IV and infusion said the cancer center with Dr.YU and is scheduled to get as outpatient on January 4 at 3 PM. -- Hemoglobin is 8.4.   CKD stage 4 due to type 2 diabetes mellitus ([place]) --Patient has type 2 diabetes mellitus with complications of stage IV chronic kidney disease.  -- Renal function at baseline --resumed at discharge Actos and Trulicity  CAD (coronary artery disease) Hold aspirin due to acute blood loss anemia --resumed BB at very low dose with labile BP and holding parameter --d/c HXTZ/triamterene   Morbid obesity due to excess calories [facility]) --Patient meets criteria with BMI greater than 40   HTN (hypertension) --Initially held antihypertensive medications as patient was normotensive, blood pressure has been trending up, so we will restart BB   Overall improving. D/c plans d/w pt and she is agreeable left voicemail for Randy Whitener, son.      Consultants: GI Procedures performed: EGD, capsule endoscopy, colonoscopy Disposition: Home Diet recommendation:  Cardiac diet DISCHARGE MEDICATION: Allergies as of 07/01/2022  Reactions   Ferrous Sulfate Itching   Keflex [cephalexin] Itching   Percocet [oxycodone-acetaminophen] Itching   Vicodin [hydrocodone-acetaminophen] Itching   Ace Inhibitors Rash   elevated potassium at higher doses   Penicillins Rash   Has patient had a PCN reaction causing immediate rash, facial/tongue/throat swelling, SOB or lightheadedness with hypotension: No Has patient  had a PCN reaction causing severe rash involving mucus membranes or skin necrosis: No Has patient had a PCN reaction that required hospitalization No Has patient had a PCN reaction occurring within the last 10 years: No If all of the above answers are "NO", then may proceed with Cephalosporin use.        Medication List     STOP taking these medications    Infusion Set Misc   triamterene-hydrochlorothiazide 37.5-25 MG capsule Commonly known as: DYAZIDE       TAKE these medications    aspirin EC 81 MG tablet Take 81 mg by mouth daily.   doxazosin 2 MG tablet Commonly known as: Cardura Take 1 tablet (2 mg total) by mouth 2 (two) times daily.   furosemide 40 MG tablet Commonly known as: LASIX Take 1 tablet (40 mg total) by mouth daily.   metoprolol succinate 25 MG 24 hr tablet Commonly known as: TOPROL-XL Take 1 tablet (25 mg total) by mouth daily. Hold if your BP in <120 What changed:  medication strength how much to take how to take this when to take this additional instructions   multivitamin with minerals tablet Take 1 tablet by mouth daily.   pantoprazole 40 MG tablet Commonly known as: PROTONIX Take 1 tablet (40 mg total) by mouth daily. Start taking on: July 02, 2022   pioglitazone 30 MG tablet Commonly known as: ACTOS Take 30 mg by mouth daily.   rosuvastatin 40 MG tablet Commonly known as: CRESTOR Take 1 tablet (40 mg total) by mouth daily.   Trulicity 1.5 QQ/2.2LN Sopn Generic drug: Dulaglutide INJECT 1.5 MG SUBCUTANEOUSLY EVERY 7 (SEVEN) DAYS   Vitamin D3 50 MCG (2000 UT) Tabs Take 2,000 Units by mouth daily.               Durable Medical Equipment  (From admission, onward)           Start     Ordered   07/01/22 1100  For home use only DME Bedside commode  Once       Question:  Patient needs a bedside commode to treat with the following condition  Answer:  General weakness   07/01/22 1059            Follow-up  Information     Rusty Aus, MD. Schedule an appointment as soon as possible for a visit in 1 week(s).   Specialty: Internal Medicine Contact information: [place] [address] [phone]         Annamaria Helling, DO. Schedule an appointment as soon as possible for a visit in 2 week(s).   Specialty: Gastroenterology Why: Anemia Contact information: [facility] [place] [address] [phone]         Earlie Server, MD. Go on 07/07/2022.   Specialty: Oncology Why: at 3pm for IV iron Contact information: [place] [address] [phone]                Discharge Exam: Danley Danker Weights   06/25/22 1046 06/25/22 1047  Weight: 113.4 kg 113.4 kg  Morbidly obese, alert and oriented times three. Respiratory clear to auscultation. Abdomen soft benign nontender. Neuro- alert and oriented times three.  Condition at discharge: fair  The results of significant diagnostics from this hospitalization (including imaging, microbiology, ancillary and laboratory) are listed below for reference.   Imaging Studies: DG Abd 1 View  Result Date: 06/29/2022 CLINICAL DATA:  Medial capsule study. EXAM: ABDOMEN - 1 VIEW COMPARISON:  CT abdomen pelvis 04/15/2022. FINDINGS: There are 4 images of the abdomen, only 1 of which shows the capsule in the lateral left mid abdomen, possibly within the descending colon. Fair amount of stool in the colon. Visualized lung bases of show small bilateral pleural effusions. Postoperative changes in the lumbosacral spine. Right hip arthroplasty. Surgical clips in the right upper quadrant. IMPRESSION: 1. Radiopaque capsule device is in the lateral left mid abdomen, possibly within descending colon. CT abdomen pelvis would be confirmatory. 2. Fair amount of stool in the colon indicative of constipation. 3. Small bilateral pleural effusions.  Electronically Signed   By: Lorin Picket M.D.   On: 06/29/2022 11:41   DG Chest 2 View  Result Date: 06/25/2022 CLINICAL DATA:  Shortness of breath. EXAM: CHEST - 2 VIEW COMPARISON:  06/11/2020 FINDINGS: Stable mild cardiac enlargement. Aortic atherosclerosis. No pleural effusion or interstitial edema. No airspace opacities. Visualized osseous structures are unremarkable. IMPRESSION: No acute cardiopulmonary abnormalities. Electronically Signed   By: Kerby Moors M.D.   On: 06/25/2022 08:24    Microbiology: No results found for this or any previous visit.  Labs: CBC: Recent Labs  Lab 06/25/22 0723 06/25/22 1900 06/26/22 1642 06/27/22 0909 06/28/22 0627 06/29/22 0519 06/30/22 0321  WBC 6.6  --   --  8.5 7.2 6.4 5.9  HGB 6.7*   < > 8.9* 8.0* 8.2* 8.8* 8.4*  HCT 24.5*   < > 29.9* 27.8* 28.1* 30.5* 29.2*  MCV 94.6  --   --  92.7 93.4 93.6 93.9  PLT 272  --   --  228 201 193 169   < > = values in this interval not displayed.   Basic Metabolic Panel: Recent Labs  Lab 06/25/22 0723 06/26/22 0530 06/27/22 0909 06/28/22 0627 06/30/22 0321  NA 137 141 140 141 140  K 3.5 3.8 4.1 4.1 4.4  CL 105 110 108 110 110  CO2 '22 26 25 24 27  '$ GLUCOSE 115* 86 106* 104* 112*  BUN 38* 33* 32* 30* 22  CREATININE 1.83* 1.74* 1.93* 1.96* 1.37*  CALCIUM 8.4* 8.0* 8.0* 8.1* 8.0*   Liver Function Tests: No results for input(s): "AST", "ALT", "ALKPHOS", "BILITOT", "PROT", "ALBUMIN" in the last 168 hours. CBG: Recent Labs  Lab 06/30/22 0716 06/30/22 1117 06/30/22 1540 06/30/22 1956 07/01/22 0746  GLUCAP 93 99 101* 150* 78    Discharge time spent: greater than 30 minutes.  Signed: Fritzi Mandes, MD [facility] 07/01/2022

## 2022-07-01 NOTE — Care Management Important Message (Signed)
Important Message  Patient Details  Name: Melissa Christian MRN: [id number] Date of Birth: October 13, 1940   Medicare Important Message Given:  Yes     Juliann Pulse A Carrye Goller 07/01/2022, 1:26 PM

## 2022-07-01 NOTE — Progress Notes (Signed)
Cephas Darby, MD [address]  [place]  [place], [place] [address]  Main: [phone]  Fax: [phone] Pager: [phone]   Subjective: No acute events overnight, hemoglobin has been stable.  Patient is sitting up in chair and ready to go home.  She underwent video capsule endoscopy yesterday   Objective: Vital signs in last 24 hours: Vitals:   06/30/22 1624 06/30/22 1914 07/01/22 0408 07/01/22 0710  BP: (!) 153/55 113/86 (!) 122/42 (!) 101/41  Pulse: 83 83 84 85  Resp: '18 17 16 20  '$ Temp: 97.8 F (36.6 C) 97.9 F (36.6 C) 98 F (36.7 C) 98.2 F (36.8 C)  TempSrc:      SpO2: 97% 97% 99% 99%  Weight:      Height:       Weight change:   Intake/Output Summary (Last 24 hours) at 07/01/2022 1327 Last data filed at 07/01/2022 0845 Gross per 24 hour  Intake 635.67 ml  Output 300 ml  Net 335.67 ml     Exam: Heart:: Regular rate and rhythm, S1S2 present, or without murmur or extra heart sounds Lungs: normal and clear to auscultation Abdomen: soft, nontender, normal bowel sounds   Lab Results:    Latest Ref Rng & Units 06/30/2022    3:21 AM 06/29/2022    5:19 AM 06/28/2022    6:27 AM  CBC  WBC 4.0 - 10.5 K/uL 5.9  6.4  7.2   Hemoglobin 12.0 - 15.0 g/dL 8.4  8.8  8.2   Hematocrit 36.0 - 46.0 % 29.2  30.5  28.1   Platelets 150 - 400 K/uL 169  193  201       Latest Ref Rng & Units 06/30/2022    3:21 AM 06/28/2022    6:27 AM 06/27/2022    9:09 AM  CMP  Glucose 70 - 99 mg/dL 112  104  106   BUN 8 - 23 mg/dL 22  30  32   Creatinine 0.44 - 1.00 mg/dL 1.37  1.96  1.93   Sodium 135 - 145 mmol/L 140  141  140   Potassium 3.5 - 5.1 mmol/L 4.4  4.1  4.1   Chloride 98 - 111 mmol/L 110  110  108   CO2 22 - 32 mmol/L '27  24  25   '$ Calcium 8.9 - 10.3 mg/dL 8.0  8.1  8.0     Micro Results: No results found for this or any previous visit (from the past 240 hour(s)). Studies/Results: No results found. Medications: I have reviewed the patient's current  medications. Prior to Admission:  Medications Prior to Admission  Medication Sig Dispense Refill Last Dose   aspirin EC 81 MG tablet Take 81 mg by mouth daily.   06/24/2022   Cholecalciferol (VITAMIN D3) 2000 units TABS Take 2,000 Units by mouth daily.   06/24/2022   doxazosin (CARDURA) 2 MG tablet Take 1 tablet (2 mg total) by mouth 2 (two) times daily. 180 tablet 3 06/24/2022   furosemide (LASIX) 40 MG tablet Take 1 tablet (40 mg total) by mouth daily. 90 tablet 3 06/24/2022   pioglitazone (ACTOS) 30 MG tablet Take 30 mg by mouth daily.    06/24/2022   rosuvastatin (CRESTOR) 40 MG tablet Take 1 tablet (40 mg total) by mouth daily. 90 tablet 3 06/24/2022   triamterene-hydrochlorothiazide (DYAZIDE) 37.5-25 MG capsule Take 1 each (1 capsule total) by mouth every other day. 90 capsule 3 91/63/8466   TRULICITY 1.5 ZL/9.3TT SOPN INJECT  1.5 MG SUBCUTANEOUSLY EVERY 7 (SEVEN) DAYS   Past Week   [DISCONTINUED] metoprolol succinate (TOPROL-XL) 100 MG 24 hr tablet Take 1 tablet in the morning and 1/2 tablet in the evening. (Patient taking differently: Take 50-100 mg by mouth 2 (two) times daily. Take 1 tablet in the morning and 1/2 tablet in the evening.) 126 tablet 3 06/24/2022   IV Sets-Tubing (INFUSION SET) MISC by Does not apply route. Iron infusion weekly at facility      Scheduled:  cholecalciferol  2,000 Units Oral Daily   doxazosin  2 mg Oral BID   furosemide  40 mg Oral Daily   insulin aspart  0-15 Units Subcutaneous TID WC   nystatin   Topical BID   pantoprazole  40 mg Oral Daily   rosuvastatin  40 mg Oral Daily   sodium chloride flush  3 mL Intravenous Q12H   Continuous:  sodium chloride     sodium chloride 100 mL/hr at 06/30/22 1254   ZOX:WRUEAV chloride, ondansetron **OR** ondansetron (ZOFRAN) IV, sodium chloride flush Anti-infectives (From admission, onward)    None      Scheduled Meds:  cholecalciferol  2,000 Units Oral Daily   doxazosin  2 mg Oral BID   furosemide  40 mg  Oral Daily   insulin aspart  0-15 Units Subcutaneous TID WC   nystatin   Topical BID   pantoprazole  40 mg Oral Daily   rosuvastatin  40 mg Oral Daily   sodium chloride flush  3 mL Intravenous Q12H   Continuous Infusions:  sodium chloride     sodium chloride 100 mL/hr at 06/30/22 1254   PRN Meds:.sodium chloride, ondansetron **OR** ondansetron (ZOFRAN) IV, sodium chloride flush  VCE study 06/30/2022 Study is complete. Capsule reached cecum Normal small bowel transit Large sessile polyp in D3. No bleeding source identified   Assessment: Principal Problem:   ABLA (acute blood loss anemia) Active Problems:   HTN (hypertension)   Morbid obesity due to excess calories (HCC)   CAD (coronary artery disease)   Symptomatic anemia   CKD stage 4 due to type 2 diabetes mellitus ([place])   Plan: History of melena, mild iron deficiency anemia and stage IV CKD EGD revealed small sessile polyps with stigmata of recent bleeding.  There was evidence of large sessile polyp in third portion of duodenum which was not resected Colonoscopy was unremarkable Video capsule endoscopy was normal Hemoglobin has been stable, do not recommend any further GI workup at this time Follow-up with Dr. Tasia Catchings for parenteral iron therapy as needed Patient will be referred by Dr. Virgina Jock to an advanced endoscopist for removal of duodenal polyp Patient can be discharged home today   LOS: 6 days   Anniah Glick 07/01/2022, 1:27 PM

## 2022-07-01 NOTE — Progress Notes (Addendum)
Mobility Specialist - Progress Note   Pre-mobility: SpO2 (95) During mobility: SpO2(92) Post-mobility: SPO2 (94)    07/01/22 1036  Mobility  Activity Ambulated with assistance in hallway;Stood at bedside;Dangled on edge of bed  Level of Assistance Modified independent, requires aide device or extra time  Assistive Device Front wheel walker  Distance Ambulated (ft) 30 ft  Activity Response Tolerated well  Mobility Referral Yes  $Mobility charge 1 Mobility   Pt resting in bed on RA upon entry. Pt motivated to ambulate and expressed she wanted to go home today. Pt had BM and STS ModI to RW several times for 1-2 to perform assisted hygiene tasks. Pt STS and ambulates in hallway ModI with seated rest break at 84f. Pt expressed pain in her back but said she could continue walking if her back pain decreased. Pt returned to bed and left with needs in reach.   ALoma SenderMobility Specialist 07/01/22, 10:50 AM

## 2022-07-01 NOTE — Discharge Instructions (Addendum)
F/U DR Tasia Catchings AT THE [facility] ON YOUR APPT FOR IV IRON INFUSION on 07/07/2022 at 3 pm

## 2022-07-07 ENCOUNTER — Inpatient Hospital Stay: Payer: Medicare HMO | Attending: Oncology

## 2022-07-07 VITALS — BP 145/57 | HR 79 | Temp 97.9°F | Resp 18

## 2022-07-07 DIAGNOSIS — E611 Iron deficiency: Secondary | ICD-10-CM | POA: Insufficient documentation

## 2022-07-07 DIAGNOSIS — D631 Anemia in chronic kidney disease: Secondary | ICD-10-CM | POA: Diagnosis not present

## 2022-07-07 DIAGNOSIS — N183 Chronic kidney disease, stage 3 unspecified: Secondary | ICD-10-CM | POA: Diagnosis not present

## 2022-07-07 DIAGNOSIS — D508 Other iron deficiency anemias: Secondary | ICD-10-CM

## 2022-07-07 MED ORDER — SODIUM CHLORIDE 0.9 % IV SOLN
INTRAVENOUS | Status: DC
  Filled 2022-07-07 (×2): qty 250

## 2022-07-07 MED ORDER — SODIUM CHLORIDE 0.9 % IV SOLN
200.0000 mg | Freq: Once | INTRAVENOUS | Status: AC
  Administered 2022-07-07: 200 mg via INTRAVENOUS
  Filled 2022-07-07: qty 200

## 2022-07-07 NOTE — Patient Instructions (Signed)

## 2022-07-08 DIAGNOSIS — Z09 Encounter for follow-up examination after completed treatment for conditions other than malignant neoplasm: Secondary | ICD-10-CM | POA: Diagnosis not present

## 2022-07-12 ENCOUNTER — Other Ambulatory Visit: Payer: Medicare HMO

## 2022-07-13 DIAGNOSIS — K317 Polyp of stomach and duodenum: Secondary | ICD-10-CM | POA: Diagnosis not present

## 2022-07-13 DIAGNOSIS — D500 Iron deficiency anemia secondary to blood loss (chronic): Secondary | ICD-10-CM | POA: Diagnosis not present

## 2022-07-13 DIAGNOSIS — D132 Benign neoplasm of duodenum: Secondary | ICD-10-CM | POA: Diagnosis not present

## 2022-07-13 DIAGNOSIS — N184 Chronic kidney disease, stage 4 (severe): Secondary | ICD-10-CM | POA: Diagnosis not present

## 2022-07-13 DIAGNOSIS — Z6841 Body Mass Index (BMI) 40.0 and over, adult: Secondary | ICD-10-CM | POA: Diagnosis not present

## 2022-07-14 ENCOUNTER — Other Ambulatory Visit: Payer: Self-pay

## 2022-07-18 ENCOUNTER — Ambulatory Visit: Payer: Medicare HMO

## 2022-07-18 ENCOUNTER — Ambulatory Visit: Payer: Medicare HMO | Admitting: Oncology

## 2022-07-27 ENCOUNTER — Inpatient Hospital Stay: Payer: Medicare HMO

## 2022-07-27 DIAGNOSIS — E611 Iron deficiency: Secondary | ICD-10-CM | POA: Diagnosis not present

## 2022-07-27 DIAGNOSIS — N183 Chronic kidney disease, stage 3 unspecified: Secondary | ICD-10-CM | POA: Diagnosis not present

## 2022-07-27 DIAGNOSIS — D508 Other iron deficiency anemias: Secondary | ICD-10-CM

## 2022-07-27 DIAGNOSIS — D631 Anemia in chronic kidney disease: Secondary | ICD-10-CM | POA: Diagnosis not present

## 2022-07-27 LAB — CBC WITH DIFFERENTIAL/PLATELET
Abs Immature Granulocytes: 0.02 10*3/uL (ref 0.00–0.07)
Basophils Absolute: 0 10*3/uL (ref 0.0–0.1)
Basophils Relative: 1 %
Eosinophils Absolute: 0.3 10*3/uL (ref 0.0–0.5)
Eosinophils Relative: 5 %
HCT: 35 % — ABNORMAL LOW (ref 36.0–46.0)
Hemoglobin: 10 g/dL — ABNORMAL LOW (ref 12.0–15.0)
Immature Granulocytes: 0 %
Lymphocytes Relative: 10 %
Lymphs Abs: 0.6 10*3/uL — ABNORMAL LOW (ref 0.7–4.0)
MCH: 27.2 pg (ref 26.0–34.0)
MCHC: 28.6 g/dL — ABNORMAL LOW (ref 30.0–36.0)
MCV: 95.1 fL (ref 80.0–100.0)
Monocytes Absolute: 0.4 10*3/uL (ref 0.1–1.0)
Monocytes Relative: 7 %
Neutro Abs: 4.7 10*3/uL (ref 1.7–7.7)
Neutrophils Relative %: 77 %
Platelets: 194 10*3/uL (ref 150–400)
RBC: 3.68 MIL/uL — ABNORMAL LOW (ref 3.87–5.11)
RDW: 20 % — ABNORMAL HIGH (ref 11.5–15.5)
WBC: 6 10*3/uL (ref 4.0–10.5)
nRBC: 0 % (ref 0.0–0.2)

## 2022-07-27 LAB — IRON AND TIBC
Iron: 43 ug/dL (ref 28–170)
Saturation Ratios: 14 % (ref 10.4–31.8)
TIBC: 318 ug/dL (ref 250–450)
UIBC: 275 ug/dL

## 2022-07-27 LAB — FERRITIN: Ferritin: 39 ng/mL (ref 11–307)

## 2022-07-27 LAB — SAMPLE TO BLOOD BANK

## 2022-07-28 ENCOUNTER — Inpatient Hospital Stay (HOSPITAL_BASED_OUTPATIENT_CLINIC_OR_DEPARTMENT_OTHER): Payer: Medicare HMO | Admitting: Oncology

## 2022-07-28 ENCOUNTER — Encounter: Payer: Self-pay | Admitting: Oncology

## 2022-07-28 ENCOUNTER — Inpatient Hospital Stay: Payer: Medicare HMO

## 2022-07-28 VITALS — BP 126/45 | HR 68 | Temp 97.1°F

## 2022-07-28 VITALS — BP 119/89 | HR 70

## 2022-07-28 DIAGNOSIS — D631 Anemia in chronic kidney disease: Secondary | ICD-10-CM | POA: Diagnosis not present

## 2022-07-28 DIAGNOSIS — N184 Chronic kidney disease, stage 4 (severe): Secondary | ICD-10-CM | POA: Diagnosis not present

## 2022-07-28 DIAGNOSIS — D132 Benign neoplasm of duodenum: Secondary | ICD-10-CM

## 2022-07-28 DIAGNOSIS — D500 Iron deficiency anemia secondary to blood loss (chronic): Secondary | ICD-10-CM

## 2022-07-28 DIAGNOSIS — E611 Iron deficiency: Secondary | ICD-10-CM | POA: Diagnosis not present

## 2022-07-28 DIAGNOSIS — D508 Other iron deficiency anemias: Secondary | ICD-10-CM

## 2022-07-28 DIAGNOSIS — N183 Chronic kidney disease, stage 3 unspecified: Secondary | ICD-10-CM | POA: Diagnosis not present

## 2022-07-28 MED ORDER — SODIUM CHLORIDE 0.9 % IV SOLN
200.0000 mg | Freq: Once | INTRAVENOUS | Status: AC
  Administered 2022-07-28: 200 mg via INTRAVENOUS
  Filled 2022-07-28: qty 200

## 2022-07-28 MED ORDER — SODIUM CHLORIDE 0.9 % IV SOLN
INTRAVENOUS | Status: DC
  Filled 2022-07-28: qty 250

## 2022-07-28 MED ORDER — FERROUS GLUCONATE 324 (38 FE) MG PO TABS: 324.0000 mg | ORAL_TABLET | Freq: Every day | ORAL | 3 refills | Status: AC

## 2022-07-28 NOTE — Patient Instructions (Signed)
Iron Sucrose Injection What is this medication? IRON SUCROSE (EYE ern SOO krose) treats low levels of iron (iron deficiency anemia) in people with kidney disease. Iron is a mineral that plays an important role in making red blood cells, which carry oxygen from your lungs to the rest of your body. This medicine may be used for other purposes; ask your health care provider or pharmacist if you have questions. COMMON BRAND NAME(S): Venofer What should I tell my care team before I take this medication? They need to know if you have any of these conditions: Anemia not caused by low iron levels Heart disease High levels of iron in the blood Kidney disease Liver disease An unusual or allergic reaction to iron, other medications, foods, dyes, or preservatives Pregnant or trying to get pregnant Breastfeeding How should I use this medication? This medication is for infusion into a vein. It is given in a hospital or clinic setting. Talk to your care team about the use of this medication in children. While this medication may be prescribed for children as young as 2 years for selected conditions, precautions do apply. Overdosage: If you think you have taken too much of this medicine contact a poison control center or emergency room at once. NOTE: This medicine is only for you. Do not share this medicine with others. What if I miss a dose? Keep appointments for follow-up doses. It is important not to miss your dose. Call your care team if you are unable to keep an appointment. What may interact with this medication? Do not take this medication with any of the following: Deferoxamine Dimercaprol Other iron products This medication may also interact with the following: Chloramphenicol Deferasirox This list may not describe all possible interactions. Give your health care provider a list of all the medicines, herbs, non-prescription drugs, or dietary supplements you use. Also tell them if you smoke,  drink alcohol, or use illegal drugs. Some items may interact with your medicine. What should I watch for while using this medication? Visit your care team regularly. Tell your care team if your symptoms do not start to get better or if they get worse. You may need blood work done while you are taking this medication. You may need to follow a special diet. Talk to your care team. Foods that contain iron include: whole grains/cereals, dried fruits, beans, or peas, leafy green vegetables, and organ meats (liver, kidney). What side effects may I notice from receiving this medication? Side effects that you should report to your care team as soon as possible: Allergic reactions--skin rash, itching, hives, swelling of the face, lips, tongue, or throat Low blood pressure--dizziness, feeling faint or lightheaded, blurry vision Shortness of breath Side effects that usually do not require medical attention (report to your care team if they continue or are bothersome): Flushing Headache Joint pain Muscle pain Nausea Pain, redness, or irritation at injection site This list may not describe all possible side effects. Call your doctor for medical advice about side effects. You may report side effects to FDA at 1-800-FDA-1088. Where should I keep my medication? This medication is given in a hospital or clinic and will not be stored at home. NOTE: This sheet is a summary. It may not cover all possible information. If you have questions about this medicine, talk to your doctor, pharmacist, or health care provider.  2023 Elsevier/Gold Standard (2020-10-01 00:00:00)

## 2022-07-28 NOTE — Assessment & Plan Note (Signed)
Labs reviewed and discussed with patient. severe iron deficiency anemia. Suspect chronic blood loss from GI tract.  Patient previously declined GI work-up. S/p IV Venofer, Labs are reviewed and discussed with patient. Improved hemoglobin and iron panel.  Recommend additional IV Venofer weekly x 3  treatments.

## 2022-07-28 NOTE — Assessment & Plan Note (Signed)
Patient will see [facility] for evaluation of resection

## 2022-07-28 NOTE — Assessment & Plan Note (Signed)
Continue monitor hemoglobin is above 10.  No need for erythropoietin therapy currently.

## 2022-07-28 NOTE — Progress Notes (Addendum)
Hematology/Oncology Progress note [phone] [phone]         Patient Care Team: Rusty Aus, MD as PCP - General (Internal Medicine) Minna Merritts, MD as PCP - Cardiology (Cardiology) Minna Merritts, MD as Consulting Physician (Cardiology) Earlie Server, MD as Consulting Physician (Oncology)   CHIEF COMPLAINTS/REASON FOR VISIT:  Iron deficiency anemia  ASSESSMENT & PLAN:   IDA (iron deficiency anemia) Labs reviewed and discussed with patient. severe iron deficiency anemia. Suspect chronic blood loss from GI tract.  Patient previously declined GI work-up. S/p IV Venofer, Labs are reviewed and discussed with patient. Improved hemoglobin and iron panel.  Recommend additional IV Venofer weekly x 3  treatments.     Anemia in chronic kidney disease (CKD) Continue monitor hemoglobin is above 10.  No need for erythropoietin therapy currently.  Adenomatous duodenal polyp Patient will see [facility] for evaluation of resection    Orders Placed This Encounter  Procedures   CBC with Differential/Platelet    Standing Status:   Future    Standing Expiration Date:   07/28/2023   Ferritin    Standing Status:   Future    Standing Expiration Date:   07/29/2023   Iron and TIBC([facility])    Standing Status:   Future    Standing Expiration Date:   07/29/2023   Hold Tube- Blood Bank    Standing Status:   Future    Standing Expiration Date:   07/29/2023   Follow-up in 3 months. All questions were answered. The patient knows to call the clinic with any problems, questions or concerns.  Earlie Server, MD, PhD [facility] Hematology Oncology 07/28/2022     HISTORY OF PRESENTING ILLNESS:  Melissa Christian is a  82 y.o.  female with PMH listed below who was referred to me for anemia Reviewed patient's recent labs that was done.  She was found to have abnormal CBC on 02/22/2022, with a hemoglobin of 8.4, MCV 83.5.  B12 adequate, patient was advised to take  ferrous sulfate 325 mg twice daily.  She had a repeat hemoglobin 03/31/2022 which was 7.9, ferritin 9.  Reviewed patient's previous labs ordered by primary care physician's office, anemia is chronic onset , since early 2023. Patient reports feeling tired.  No energy. She denies recent chest pain on exertion, pre-syncopal episodes, or palpitations She had not noticed any recent bleeding such as epistaxis, hematuria or hematochezia.  She denies over the counter NSAID ingestion. She is on antiplatelets agent-aspirin 81 mg. Continue had a colonoscopy. She was accompanied by her sister.  INTERVAL HISTORY Melissa Christian is a 82 y.o. female who has above history reviewed by me today presents for follow up visit for iron deficiency anemia.  S/p IV venofer.  Patient reports fatigue level has improved. During the interval, patient establish with gastroenterology and had GI workup. 06/26/2022 colonoscopy showed entire examined colon is normal.  Examined portion of ileum was normal. Upper endoscopy showed normal esophagus.  A few gastric polyps.  A single duodenal polyp biopsied.-Pathology showed duodenum tubular adenoma 07/06/2022 capsule study showed duodenal polyp, otherwise normal study.   MEDICAL HISTORY:  Past Medical History:  Diagnosis Date   Anemia    CAD (coronary artery disease)    a. NSTEMI 09/2015: cardiac cath: LM no obs dz, mLAD 95% s/p PCI/DES 0%, LCx no obs, RCA no obs, LVEF 55-65%   Chronic kidney disease (CKD), stage III (moderate) (HCC)    Diabetes mellitus with complication (HCC)  HLD (hyperlipidemia)    Hypertension    Low blood potassium    NSTEMI (non-ST elevated myocardial infarction) (HCC)    PONV (postoperative nausea and vomiting)    Valvular heart disease    a. 09/2015: EF 55-60%, challenging images unable to exclude mild mid-distal anterior to apical HK, nl LV dias fxn, mild AI/MR, normal size of left atrium, RV systolic function normal, PASP normal      SURGICAL  HISTORY: Past Surgical History:  Procedure Laterality Date   BACK SURGERY     CARDIAC CATHETERIZATION N/A 09/23/2015   Procedure: Right and Left Heart Cath;  Surgeon: Minna Merritts, MD;  Location: [place] [facility];  Service: Cardiovascular;  Laterality: N/A;   CARDIAC CATHETERIZATION N/A 09/23/2015   Procedure: Coronary Stent Intervention;  Surgeon: Yolonda Kida, MD;  Location: [place] [facility];  Service: Cardiovascular;  Laterality: N/A;   CATARACT EXTRACTION W/PHACO Left 04/26/2017   Procedure: CATARACT EXTRACTION PHACO AND INTRAOCULAR LENS PLACEMENT ([place]) LEFT DIABETIC;  Surgeon: Leandrew Koyanagi, MD;  Location: [place];  Service: Ophthalmology;  Laterality: Left;  diabetic-oral meds   CATARACT EXTRACTION W/PHACO Right 05/31/2017   Procedure: CATARACT EXTRACTION PHACO AND INTRAOCULAR LENS PLACEMENT ([place]) RIGHT DIABETIC;  Surgeon: Leandrew Koyanagi, MD;  Location: [place];  Service: Ophthalmology;  Laterality: Right;   CHOLECYSTECTOMY N/A 06/16/2016   Procedure: LAPAROSCOPIC CHOLECYSTECTOMY WITH INTRAOPERATIVE CHOLANGIOGRAM;  Surgeon: Leonie Green, MD;  Location: [facility];  Service: General;  Laterality: N/A;   COLONOSCOPY WITH PROPOFOL N/A 06/26/2022   Procedure: COLONOSCOPY WITH PROPOFOL;  Surgeon: Jonathon Bellows, MD;  Location: [facility];  Service: Gastroenterology;  Laterality: N/A;   CORONARY STENT PLACEMENT     ESOPHAGOGASTRODUODENOSCOPY N/A 06/30/2022   Procedure: ESOPHAGOGASTRODUODENOSCOPY (EGD);  Surgeon: Lin Landsman, MD;  Location: [facility] ENDOSCOPY;  Service: Gastroenterology;  Laterality: N/A;   ESOPHAGOGASTRODUODENOSCOPY (EGD) WITH PROPOFOL N/A 06/26/2022   Procedure: ESOPHAGOGASTRODUODENOSCOPY (EGD) WITH PROPOFOL;  Surgeon: Jonathon Bellows, MD;  Location: [facility] ENDOSCOPY;  Service: Gastroenterology;  Laterality: N/A;   GIVENS CAPSULE STUDY N/A 06/30/2022   Procedure: GIVENS CAPSULE STUDY;  Surgeon: Lin Landsman, MD;   Location: [facility] ENDOSCOPY;  Service: Gastroenterology;  Laterality: N/A;  PLACE CAPSULE DURING EGD   JOINT REPLACEMENT     LUMBAR FUSION     TOTAL HIP ARTHROPLASTY      SOCIAL HISTORY: Social History   Socioeconomic History   Marital status: Widowed    Spouse name: Not on file   Number of children: Not on file   Years of education: Not on file   Highest education level: Not on file  Occupational History   Not on file  Tobacco Use   Smoking status: Never   Smokeless tobacco: Never  Vaping Use   Vaping Use: Never used  Substance and Sexual Activity   Alcohol use: No    Alcohol/week: 0.0 standard drinks of alcohol   Drug use: No   Sexual activity: Not Currently    Birth control/protection: None, Post-menopausal  Other Topics Concern   Not on file  Social History Narrative   Not on file   Social Determinants of Health   Financial Resource Strain: Not on file  Food Insecurity: No Food Insecurity (06/26/2022)   Hunger Vital Sign    Worried About Running Out of Food in the Last Year: Never true    Ran Out of Food in the Last Year: Never true  Transportation Needs: No Transportation Needs (06/26/2022)   PRAPARE -  Hydrologist (Medical): No    Lack of Transportation (Non-Medical): No  Physical Activity: Not on file  Stress: Not on file  Social Connections: Not on file  Intimate Partner Violence: Not At Risk (06/26/2022)   Humiliation, Afraid, Rape, and Kick questionnaire    Fear of Current or Ex-Partner: No    Emotionally Abused: No    Physically Abused: No    Sexually Abused: No    FAMILY HISTORY: Family History  Problem Relation Age of Onset   Diabetes Mother    Hypertension Mother    Heart disease Father    Cancer Maternal Grandfather     ALLERGIES:  is allergic to ferrous sulfate, keflex [cephalexin], percocet [oxycodone-acetaminophen], vicodin [hydrocodone-acetaminophen], ace inhibitors, and penicillins.  MEDICATIONS:  Current  Outpatient Medications  Medication Sig Dispense Refill   aspirin EC 81 MG tablet Take 81 mg by mouth daily.     Cholecalciferol (VITAMIN D3) 2000 units TABS Take 2,000 Units by mouth daily.     doxazosin (CARDURA) 2 MG tablet Take 1 tablet (2 mg total) by mouth 2 (two) times daily. 180 tablet 3   ferrous gluconate (FERGON) 324 MG tablet Take 1 tablet (324 mg total) by mouth daily with breakfast. 60 tablet 3   furosemide (LASIX) 40 MG tablet Take 1 tablet (40 mg total) by mouth daily. 90 tablet 3   metoprolol succinate (TOPROL-XL) 100 MG 24 hr tablet Take 100 mg by mouth daily.     Multiple Vitamins-Minerals (MULTIVITAMIN WITH MINERALS) tablet Take 1 tablet by mouth daily. 120 tablet 2   pantoprazole (PROTONIX) 40 MG tablet Take 1 tablet (40 mg total) by mouth daily. 30 tablet 1   pioglitazone (ACTOS) 30 MG tablet Take 30 mg by mouth daily.      rosuvastatin (CRESTOR) 40 MG tablet Take 1 tablet (40 mg total) by mouth daily. 90 tablet 3   TRULICITY 1.5 ZO/1.0RU SOPN INJECT 1.5 MG SUBCUTANEOUSLY EVERY 7 (SEVEN) DAYS     metoprolol succinate (TOPROL-XL) 25 MG 24 hr tablet Take 1 tablet (25 mg total) by mouth daily. Hold if your BP in <120 (Patient not taking: Reported on 07/28/2022) 30 tablet 0   No current facility-administered medications for this visit.   Facility-Administered Medications Ordered in Other Visits  Medication Dose Route Frequency Provider Last Rate Last Admin   0.9 %  sodium chloride infusion   Intravenous Continuous Earlie Server, MD   Stopped at 07/28/22 1506    Review of Systems  Constitutional:  Positive for fatigue. Negative for appetite change, chills and fever.  HENT:   Negative for hearing loss and voice change.   Eyes:  Negative for eye problems.  Respiratory:  Negative for chest tightness and cough.   Cardiovascular:  Negative for chest pain.  Gastrointestinal:  Negative for abdominal distention, abdominal pain and blood in stool.  Endocrine: Negative for hot flashes.   Genitourinary:  Negative for difficulty urinating and frequency.   Musculoskeletal:  Negative for arthralgias.  Skin:  Negative for itching and rash.  Neurological:  Negative for extremity weakness.  Hematological:  Negative for adenopathy.  Psychiatric/Behavioral:  Negative for confusion.     PHYSICAL EXAMINATION: ECOG PERFORMANCE STATUS: 2 - Symptomatic, <50% confined to bed Vitals:   07/28/22 1329  BP: (!) 126/45  Pulse: 68  Temp: (!) 97.1 F (36.2 C)  SpO2: 96%   There were no vitals filed for this visit.   Physical Exam Constitutional:  General: She is not in acute distress.    Appearance: She is obese.     Comments: Patient sits in the wheelchair  HENT:     Head: Normocephalic and atraumatic.  Eyes:     General: No scleral icterus. Cardiovascular:     Rate and Rhythm: Normal rate and regular rhythm.     Heart sounds: Murmur heard.  Pulmonary:     Effort: Pulmonary effort is normal. No respiratory distress.     Breath sounds: No wheezing.  Abdominal:     General: Bowel sounds are normal. There is no distension.     Palpations: Abdomen is soft.  Musculoskeletal:        General: No deformity. Normal range of motion.     Cervical back: Normal range of motion and neck supple.  Skin:    General: Skin is warm and dry.     Coloration: Skin is not pale.     Findings: No erythema.  Neurological:     Mental Status: She is alert and oriented to person, place, and time. Mental status is at baseline.     Cranial Nerves: No cranial nerve deficit.  Psychiatric:        Mood and Affect: Mood normal.      LABORATORY DATA:  I have reviewed the data as listed    Latest Ref Rng & Units 07/27/2022   10:52 AM 06/30/2022    3:21 AM 06/29/2022    5:19 AM  CBC  WBC 4.0 - 10.5 K/uL 6.0  5.9  6.4   Hemoglobin 12.0 - 15.0 g/dL 10.0  8.4  8.8   Hematocrit 36.0 - 46.0 % 35.0  29.2  30.5   Platelets 150 - 400 K/uL 194  169  193       Latest Ref Rng & Units 06/30/2022     3:21 AM 06/28/2022    6:27 AM 06/27/2022    9:09 AM  CMP  Glucose 70 - 99 mg/dL 112  104  106   BUN 8 - 23 mg/dL 22  30  32   Creatinine 0.44 - 1.00 mg/dL 1.37  1.96  1.93   Sodium 135 - 145 mmol/L 140  141  140   Potassium 3.5 - 5.1 mmol/L 4.4  4.1  4.1   Chloride 98 - 111 mmol/L 110  110  108   CO2 22 - 32 mmol/L '27  24  25   '$ Calcium 8.9 - 10.3 mg/dL 8.0  8.1  8.0       Component Value Date/Time   IRON 43 07/27/2022 1052   TIBC 318 07/27/2022 1052   FERRITIN 39 07/27/2022 1052   IRONPCTSAT 14 07/27/2022 1052     RADIOGRAPHIC STUDIES: I have personally reviewed the radiological images as listed and agreed with the findings in the report. DG Abd 1 View  Result Date: 06/29/2022 CLINICAL DATA:  Medial capsule study. EXAM: ABDOMEN - 1 VIEW COMPARISON:  CT abdomen pelvis 04/15/2022. FINDINGS: There are 4 images of the abdomen, only 1 of which shows the capsule in the lateral left mid abdomen, possibly within the descending colon. Fair amount of stool in the colon. Visualized lung bases of show small bilateral pleural effusions. Postoperative changes in the lumbosacral spine. Right hip arthroplasty. Surgical clips in the right upper quadrant. IMPRESSION: 1. Radiopaque capsule device is in the lateral left mid abdomen, possibly within descending colon. CT abdomen pelvis would be confirmatory. 2. Fair amount of stool in the colon indicative of  constipation. 3. Small bilateral pleural effusions. Electronically Signed   By: Lorin Picket M.D.   On: 06/29/2022 11:41

## 2022-08-03 ENCOUNTER — Encounter: Payer: Self-pay | Admitting: Gastroenterology

## 2022-08-03 MED FILL — Iron Sucrose Inj 20 MG/ML (Fe Equiv): INTRAVENOUS | Qty: 10 | Status: AC

## 2022-08-04 ENCOUNTER — Inpatient Hospital Stay: Payer: Medicare HMO | Attending: Oncology

## 2022-08-04 VITALS — BP 126/59 | HR 70 | Temp 97.8°F | Resp 18

## 2022-08-04 DIAGNOSIS — D508 Other iron deficiency anemias: Secondary | ICD-10-CM

## 2022-08-04 DIAGNOSIS — N183 Chronic kidney disease, stage 3 unspecified: Secondary | ICD-10-CM | POA: Diagnosis not present

## 2022-08-04 DIAGNOSIS — D631 Anemia in chronic kidney disease: Secondary | ICD-10-CM | POA: Insufficient documentation

## 2022-08-04 MED ORDER — SODIUM CHLORIDE 0.9 % IV SOLN
200.0000 mg | Freq: Once | INTRAVENOUS | Status: AC
  Administered 2022-08-04: 200 mg via INTRAVENOUS
  Filled 2022-08-04: qty 200

## 2022-08-04 MED ORDER — SODIUM CHLORIDE 0.9 % IV SOLN
INTRAVENOUS | Status: DC | PRN
  Filled 2022-08-04: qty 250

## 2022-08-08 NOTE — Progress Notes (Unsigned)
Cardiology Office Note  Date:  08/09/2022   ID:  [name], [name] 1940/09/30, MRN [id number]  PCP:  Rusty Aus, MD   Chief Complaint  Patient presents with   3 month follow up     Patient c/o bilateral LE edema. Medications reviewed by the patient verbally.     HPI:  Melissa Christian is a 82 y.o. female with hx of CAD NSTEMI 09/2015: cardiac cath: mLAD 95% s/p PCI/DES 0%, LCx no obs, RCA no obs, LVEF 55-65%,Xience stent  lap chole 06/2016 hypertension,  EF >55% on echo 09/23/15 Hyperlipidemia Mild AI Pulmonary hypertension Who presents for follow up of her CAD, S/p PCI, pulmonary hypertension, diastolic CHF  Last seen by myself in clinic August 2023 Seen by one of our providers November 2023  In the hospital 12/23: Records reviewed ABLA (acute blood loss anemia) , hemoglobin of 6.2 Status post 3 units packed red blood cells total.  EGD and COLO performed, capsule did not pass.  repeat EGD 12/28 with placement of capsule in small bowel.  per Dr Adline Mango study negative ofr any bleed  gets IV and infusion said the cancer center with Dr.YU  Iron studies normal, on iron pill Scheduled to be seen at [facility] for polyp 2/13, procedure 2/16  Reports shortness of breath, leg swelling Presenting today in a wheelchair, has difficulty walking, sedentary at home Prior issues with incontinence  EKG personally reviewed by myself on todays visit Normal sinus rhythm rate 68 bpm right bundle branch block, PVCs, left anterior fascicular block, poor R wave progression through the anterior precordial leads  Prior records reviewed Echo 07/03/2017 EF 60 to 65%, no RWMA, G1DD, mild AI, mild MR, normal RVSP, PASP normal.    Lexiscan Myoview 07/18/2017 for atypical chest discomfort ruled low risk.  Repeat echo 09/2018 for monitoring of valvular heart disease with LVEF 60 to 65%, DD, normal RVSF, RVSP  54.29mHg, moderate TR, moderate MR.  Echocardiogram March 2020 Normal ejection fraction  estimated 60%, moderately elevated right heart pressures At that time HCTZ was changed to Lasix 20 daily  Labs reviewed: CR 1.6, stable HGBA1C 5.8 Total chol 140, LDL 61 HGB 11.8   PMH:   has a past medical history of Anemia, CAD (coronary artery disease), Chronic kidney disease (CKD), stage III (moderate) ([place], Diabetes mellitus with complication ([place], HLD (hyperlipidemia), Hypertension, Low blood potassium, NSTEMI (non-ST elevated myocardial infarction) ([place], PONV (postoperative nausea and vomiting), and Valvular heart disease.  PSH:    Past Surgical History:  Procedure Laterality Date   BACK SURGERY     CARDIAC CATHETERIZATION N/A 09/23/2015   Procedure: Right and Left Heart Cath;  Surgeon: TMinna Merritts MD;  [facility];  Service: Cardiovascular;  Laterality: N/A;   CARDIAC CATHETERIZATION N/A 09/23/2015   Procedure: Coronary Stent Intervention;  Surgeon: DYolonda Kida MD;  [place] [facility];  Service: Cardiovascular;  Laterality: N/A;   CATARACT EXTRACTION W/PHACO Left 04/26/2017   Procedure: CATARACT EXTRACTION PHACO AND INTRAOCULAR LENS PLACEMENT ([place] LEFT DIABETIC;  Surgeon: BLeandrew Koyanagi MD;  [place] [facility]  Service: Ophthalmology;  Laterality: Left;  diabetic-oral meds   CATARACT EXTRACTION W/PHACO Right 05/31/2017   Procedure: CATARACT EXTRACTION PHACO AND INTRAOCULAR LENS PLACEMENT ([place] RIGHT DIABETIC;  Surgeon: BLeandrew Koyanagi MD;  [place] [facility]  Service: Ophthalmology;  Laterality: Right;   CHOLECYSTECTOMY N/A 06/16/2016   Procedure: LAPAROSCOPIC CHOLECYSTECTOMY WITH INTRAOPERATIVE CHOLANGIOGRAM;  Surgeon: JLeonie Green MD;  Location:  [facility] ORS;  Service: General;  Laterality: N/A;   COLONOSCOPY WITH PROPOFOL N/A 06/26/2022   Procedure: COLONOSCOPY WITH PROPOFOL;  Surgeon: Jonathon Bellows, MD;  Location: [facility] ENDOSCOPY;  Service: Gastroenterology;  Laterality: N/A;   CORONARY STENT  PLACEMENT     ESOPHAGOGASTRODUODENOSCOPY N/A 06/30/2022   Procedure: ESOPHAGOGASTRODUODENOSCOPY (EGD);  Surgeon: Lin Landsman, MD;  Location: [facility] ENDOSCOPY;  Service: Gastroenterology;  Laterality: N/A;   ESOPHAGOGASTRODUODENOSCOPY (EGD) WITH PROPOFOL N/A 06/26/2022   Procedure: ESOPHAGOGASTRODUODENOSCOPY (EGD) WITH PROPOFOL;  Surgeon: Jonathon Bellows, MD;  Location: [facility] ENDOSCOPY;  Service: Gastroenterology;  Laterality: N/A;   GIVENS CAPSULE STUDY N/A 06/30/2022   Procedure: GIVENS CAPSULE STUDY;  Surgeon: Lin Landsman, MD;  Location: [facility] ENDOSCOPY;  Service: Gastroenterology;  Laterality: N/A;  PLACE CAPSULE DURING EGD   JOINT REPLACEMENT     LUMBAR FUSION     TOTAL HIP ARTHROPLASTY      Current Outpatient Medications  Medication Sig Dispense Refill   aspirin EC 81 MG tablet Take 81 mg by mouth daily.     Cholecalciferol (VITAMIN D3) 2000 units TABS Take 2,000 Units by mouth daily.     doxazosin (CARDURA) 2 MG tablet Take 1 tablet (2 mg total) by mouth 2 (two) times daily. 180 tablet 3   ferrous gluconate (FERGON) 324 MG tablet Take 1 tablet (324 mg total) by mouth daily with breakfast. 60 tablet 3   furosemide (LASIX) 40 MG tablet Take 1 tablet (40 mg total) by mouth daily. 90 tablet 3   Multiple Vitamins-Minerals (MULTIVITAMIN WITH MINERALS) tablet Take 1 tablet by mouth daily. 120 tablet 2   pantoprazole (PROTONIX) 40 MG tablet Take 1 tablet (40 mg total) by mouth daily. 30 tablet 1   pioglitazone (ACTOS) 30 MG tablet Take 30 mg by mouth daily.      rosuvastatin (CRESTOR) 40 MG tablet Take 1 tablet (40 mg total) by mouth daily. 90 tablet 3   TRULICITY 1.5 ZO/1.0RU SOPN INJECT 1.5 MG SUBCUTANEOUSLY EVERY 7 (SEVEN) DAYS     metoprolol succinate (TOPROL-XL) 100 MG 24 hr tablet Take 1 tablet (100 mg total) by mouth daily. 90 tablet 1   No current facility-administered medications for this visit.    Allergies:   Ferrous sulfate, Keflex [cephalexin], Percocet  [oxycodone-acetaminophen], Vicodin [hydrocodone-acetaminophen], Ace inhibitors, and Penicillins   Social History:  The patient  reports that she has never smoked. She has never used smokeless tobacco. She reports that she does not drink alcohol and does not use drugs.   Family History:   family history includes Cancer in her maternal grandfather; Diabetes in her mother; Heart disease in her father; Hypertension in her mother.    Review of Systems: Review of Systems  Constitutional: Negative.   HENT: Negative.    Respiratory:  Positive for shortness of breath.   Cardiovascular:  Positive for leg swelling.  Gastrointestinal: Negative.   Musculoskeletal: Negative.   Neurological: Negative.   Psychiatric/Behavioral: Negative.    All other systems reviewed and are negative.   PHYSICAL EXAM: VS:  BP (!) 130/54 (BP Location: Left Arm, Patient Position: Sitting, Cuff Size: Large)   Pulse 68   Ht '5\' 4"'$  (1.626 m)   Wt 256 lb (116.1 kg)   SpO2 91%   BMI 43.94 kg/m  , BMI Body mass index is 43.94 kg/m. Constitutional:  oriented to person, place, and time. No distress.  In a wheelchair HENT:  Head: Grossly normal Eyes:  no discharge. No scleral icterus.  Neck: No JVD, no  carotid bruits  Cardiovascular: Regular rate and rhythm, no murmurs appreciated 2+ woody edema bilaterally Pulmonary/Chest: Clear to auscultation bilaterally, no wheezes or rails Abdominal: Soft.  no distension.  no tenderness.  Musculoskeletal: Normal range of motion Neurological:  normal muscle tone. Coordination normal. No atrophy Skin: Skin warm and dry Psychiatric: normal affect, pleasant   Recent Labs: 04/11/2022: ALT 12 06/30/2022: BUN 22; Creatinine, Ser 1.37; Potassium 4.4; Sodium 140 07/27/2022: Hemoglobin 10.0; Platelets 194    Lipid Panel Lab Results  Component Value Date   CHOL 174 09/24/2015   HDL 48 09/24/2015   LDLCALC 105 (H) 09/24/2015   TRIG 107 09/24/2015      Wt Readings from Last 3  Encounters:  08/09/22 256 lb (116.1 kg)  06/25/22 250 lb (113.4 kg)  06/17/22 257 lb (116.6 kg)      ASSESSMENT AND PLAN:  Coronary artery disease involving native coronary artery of native heart with unstable angina pectoris ([place]) - Plan: EKG 12-Lead Currently with no symptoms of angina. No further workup at this time. Continue current medication regimen.  Mixed hyperlipidemia - Plan: EKG 12-Lead Cholesterol at goal, lab work done through primary care total cholesterol 119 LDL 52  Essential hypertension - Plan: EKG 12-Lead Blood pressure is well controlled on today's visit. No changes made to the medications.  Type 2 diabetes mellitus with other circulatory complication, without long-term current use of insulin (HCC) -  A1c 6.8, high end of her range Calorie restriction recommended  Morbid obesity due to excess calories ([place]) - Plan: EKG 12-Lead Calorie restriction and walking program recommended  Chronic diastolic CHF with pulmonary HTN Exacerbated by morbid obesity, Very sedentary, high fluid intake, some restaurant food Continue Lasix 40 daily, extra Lasix after lunch for worsening leg swelling    Total encounter time more than 40 minutes  Greater than 50% was spent in counseling and coordination of care with the patient   Orders Placed This Encounter  Procedures   EKG 12-Lead     Signed, Esmond Plants, M.D., Ph.D. 08/09/2022  [place], [place]

## 2022-08-09 ENCOUNTER — Ambulatory Visit: Payer: Medicare HMO | Attending: Cardiovascular Disease | Admitting: Cardiovascular Disease

## 2022-08-09 ENCOUNTER — Encounter: Payer: Self-pay | Admitting: Cardiovascular Disease

## 2022-08-09 VITALS — BP 130/54 | HR 68 | Ht 64.0 in | Wt 256.0 lb

## 2022-08-09 DIAGNOSIS — R0602 Shortness of breath: Secondary | ICD-10-CM | POA: Diagnosis not present

## 2022-08-09 DIAGNOSIS — E785 Hyperlipidemia, unspecified: Secondary | ICD-10-CM | POA: Diagnosis not present

## 2022-08-09 DIAGNOSIS — I25118 Atherosclerotic heart disease of native coronary artery with other forms of angina pectoris: Secondary | ICD-10-CM

## 2022-08-09 DIAGNOSIS — I5032 Chronic diastolic (congestive) heart failure: Secondary | ICD-10-CM | POA: Diagnosis not present

## 2022-08-09 DIAGNOSIS — I10 Essential (primary) hypertension: Secondary | ICD-10-CM | POA: Diagnosis not present

## 2022-08-09 DIAGNOSIS — I272 Pulmonary hypertension, unspecified: Secondary | ICD-10-CM | POA: Diagnosis not present

## 2022-08-09 DIAGNOSIS — E1122 Type 2 diabetes mellitus with diabetic chronic kidney disease: Secondary | ICD-10-CM | POA: Diagnosis not present

## 2022-08-09 DIAGNOSIS — I351 Nonrheumatic aortic (valve) insufficiency: Secondary | ICD-10-CM | POA: Diagnosis not present

## 2022-08-09 DIAGNOSIS — N184 Chronic kidney disease, stage 4 (severe): Secondary | ICD-10-CM

## 2022-08-09 MED ORDER — METOPROLOL SUCCINATE ER 100 MG PO TB24: 100.0000 mg | ORAL_TABLET | Freq: Every day | ORAL | 1 refills | Status: DC

## 2022-08-09 NOTE — Patient Instructions (Signed)
Medication Instructions:  No changes  If you need a refill on your cardiac medications before your next appointment, please call your pharmacy.    Lab work: No new labs needed   Testing/Procedures: No new testing needed   Follow-Up: At [facility], you and your health needs are our priority.  As part of our continuing mission to provide you with exceptional heart care, we have created designated Provider Care Teams.  These Care Teams include your primary Cardiologist (physician) and Advanced Practice Providers (APPs -  Physician Assistants and Nurse Practitioners) who all work together to provide you with the care you need, when you need it.  You will need a follow up appointment in 6 months  Providers on your designated Care Team:   Christopher Berge, NP Ryan Dunn, PA-C Cadence Furth, PA-C  COVID-19 Vaccine Information can be found at: [email or web address] For questions related to vaccine distribution or appointments, please email [email or web address] or call [phone].   

## 2022-08-10 MED FILL — Iron Sucrose Inj 20 MG/ML (Fe Equiv): INTRAVENOUS | Qty: 10 | Status: AC

## 2022-08-11 ENCOUNTER — Inpatient Hospital Stay: Payer: Medicare HMO

## 2022-08-11 VITALS — BP 137/59 | HR 69 | Resp 18

## 2022-08-11 DIAGNOSIS — D508 Other iron deficiency anemias: Secondary | ICD-10-CM

## 2022-08-11 DIAGNOSIS — D631 Anemia in chronic kidney disease: Secondary | ICD-10-CM | POA: Diagnosis not present

## 2022-08-11 DIAGNOSIS — N183 Chronic kidney disease, stage 3 unspecified: Secondary | ICD-10-CM | POA: Diagnosis not present

## 2022-08-11 MED ORDER — SODIUM CHLORIDE 0.9 % IV SOLN
200.0000 mg | Freq: Once | INTRAVENOUS | Status: AC
  Administered 2022-08-11: 200 mg via INTRAVENOUS
  Filled 2022-08-11: qty 200

## 2022-08-11 MED ORDER — SODIUM CHLORIDE 0.9 % IV SOLN
INTRAVENOUS | Status: DC
  Filled 2022-08-11: qty 250

## 2022-08-11 NOTE — Progress Notes (Signed)
Pt has been educated and understands. Pt refused to stay 30 mins after iron infusion. VSS. 

## 2022-08-16 DIAGNOSIS — D509 Iron deficiency anemia, unspecified: Secondary | ICD-10-CM | POA: Diagnosis not present

## 2022-08-16 DIAGNOSIS — I2510 Atherosclerotic heart disease of native coronary artery without angina pectoris: Secondary | ICD-10-CM | POA: Diagnosis not present

## 2022-08-16 DIAGNOSIS — I272 Pulmonary hypertension, unspecified: Secondary | ICD-10-CM | POA: Diagnosis not present

## 2022-08-16 DIAGNOSIS — E1122 Type 2 diabetes mellitus with diabetic chronic kidney disease: Secondary | ICD-10-CM | POA: Diagnosis not present

## 2022-08-16 DIAGNOSIS — N1832 Chronic kidney disease, stage 3b: Secondary | ICD-10-CM | POA: Diagnosis not present

## 2022-08-16 DIAGNOSIS — D132 Benign neoplasm of duodenum: Secondary | ICD-10-CM | POA: Diagnosis not present

## 2022-08-16 DIAGNOSIS — I129 Hypertensive chronic kidney disease with stage 1 through stage 4 chronic kidney disease, or unspecified chronic kidney disease: Secondary | ICD-10-CM | POA: Diagnosis not present

## 2022-08-16 DIAGNOSIS — E785 Hyperlipidemia, unspecified: Secondary | ICD-10-CM | POA: Diagnosis not present

## 2022-08-16 DIAGNOSIS — I10 Essential (primary) hypertension: Secondary | ICD-10-CM | POA: Diagnosis not present

## 2022-08-16 DIAGNOSIS — Z6841 Body Mass Index (BMI) 40.0 and over, adult: Secondary | ICD-10-CM | POA: Diagnosis not present

## 2022-08-16 DIAGNOSIS — Z01818 Encounter for other preprocedural examination: Secondary | ICD-10-CM | POA: Diagnosis not present

## 2022-08-24 ENCOUNTER — Ambulatory Visit: Payer: Medicare HMO | Attending: Cardiology

## 2022-08-24 DIAGNOSIS — R0602 Shortness of breath: Secondary | ICD-10-CM

## 2022-08-24 LAB — ECHOCARDIOGRAM COMPLETE
AR max vel: 1.87 cm2
AV Area VTI: 1.82 cm2
AV Area mean vel: 1.9 cm2
AV Mean grad: 8 mmHg
AV Peak grad: 15.2 mmHg
Ao pk vel: 1.95 m/s
Area-P 1/2: 4.12 cm2
MV VTI: 1.52 cm2
S' Lateral: 3.1 cm

## 2022-08-24 MED ORDER — PERFLUTREN LIPID MICROSPHERE
1.0000 mL | INTRAVENOUS | Status: AC | PRN
  Administered 2022-08-24: 2 mL via INTRAVENOUS

## 2022-08-29 NOTE — Progress Notes (Signed)
Heart squeeze is 60-65%, remains strong, there is some muscle stiffness noted likely from age and hypertension, there is mild leaking noted in 2 of the four valves, there is also narrowing noted in the mitral valve, continue with current medication regimen without changes at this time.

## 2022-09-01 DIAGNOSIS — E538 Deficiency of other specified B group vitamins: Secondary | ICD-10-CM | POA: Diagnosis not present

## 2022-09-01 DIAGNOSIS — E1122 Type 2 diabetes mellitus with diabetic chronic kidney disease: Secondary | ICD-10-CM | POA: Diagnosis not present

## 2022-09-01 DIAGNOSIS — D500 Iron deficiency anemia secondary to blood loss (chronic): Secondary | ICD-10-CM | POA: Diagnosis not present

## 2022-09-01 DIAGNOSIS — N184 Chronic kidney disease, stage 4 (severe): Secondary | ICD-10-CM | POA: Diagnosis not present

## 2022-09-08 ENCOUNTER — Other Ambulatory Visit: Payer: Self-pay | Admitting: Internal Medicine

## 2022-09-08 DIAGNOSIS — Z1231 Encounter for screening mammogram for malignant neoplasm of breast: Secondary | ICD-10-CM

## 2022-09-13 DIAGNOSIS — Z0000 Encounter for general adult medical examination without abnormal findings: Secondary | ICD-10-CM | POA: Diagnosis not present

## 2022-09-13 DIAGNOSIS — D500 Iron deficiency anemia secondary to blood loss (chronic): Secondary | ICD-10-CM | POA: Diagnosis not present

## 2022-09-13 DIAGNOSIS — I272 Pulmonary hypertension, unspecified: Secondary | ICD-10-CM | POA: Diagnosis not present

## 2022-09-13 DIAGNOSIS — E1122 Type 2 diabetes mellitus with diabetic chronic kidney disease: Secondary | ICD-10-CM | POA: Diagnosis not present

## 2022-09-13 DIAGNOSIS — N183 Chronic kidney disease, stage 3 unspecified: Secondary | ICD-10-CM | POA: Diagnosis not present

## 2022-09-14 DIAGNOSIS — I2510 Atherosclerotic heart disease of native coronary artery without angina pectoris: Secondary | ICD-10-CM | POA: Diagnosis not present

## 2022-09-14 DIAGNOSIS — I130 Hypertensive heart and chronic kidney disease with heart failure and stage 1 through stage 4 chronic kidney disease, or unspecified chronic kidney disease: Secondary | ICD-10-CM | POA: Diagnosis not present

## 2022-09-14 DIAGNOSIS — I252 Old myocardial infarction: Secondary | ICD-10-CM | POA: Diagnosis not present

## 2022-09-14 DIAGNOSIS — I272 Pulmonary hypertension, unspecified: Secondary | ICD-10-CM | POA: Diagnosis not present

## 2022-09-14 DIAGNOSIS — K317 Polyp of stomach and duodenum: Secondary | ICD-10-CM | POA: Diagnosis not present

## 2022-09-14 DIAGNOSIS — E1122 Type 2 diabetes mellitus with diabetic chronic kidney disease: Secondary | ICD-10-CM | POA: Diagnosis not present

## 2022-09-14 DIAGNOSIS — D132 Benign neoplasm of duodenum: Secondary | ICD-10-CM | POA: Diagnosis not present

## 2022-09-14 DIAGNOSIS — I5032 Chronic diastolic (congestive) heart failure: Secondary | ICD-10-CM | POA: Diagnosis not present

## 2022-09-14 DIAGNOSIS — M1990 Unspecified osteoarthritis, unspecified site: Secondary | ICD-10-CM | POA: Diagnosis not present

## 2022-09-14 DIAGNOSIS — N1832 Chronic kidney disease, stage 3b: Secondary | ICD-10-CM | POA: Diagnosis not present

## 2022-09-20 DIAGNOSIS — D132 Benign neoplasm of duodenum: Secondary | ICD-10-CM | POA: Diagnosis not present

## 2022-10-03 DIAGNOSIS — Z01818 Encounter for other preprocedural examination: Secondary | ICD-10-CM | POA: Diagnosis not present

## 2022-10-03 DIAGNOSIS — K7689 Other specified diseases of liver: Secondary | ICD-10-CM | POA: Diagnosis not present

## 2022-10-03 DIAGNOSIS — D132 Benign neoplasm of duodenum: Secondary | ICD-10-CM | POA: Diagnosis not present

## 2022-10-04 DIAGNOSIS — I5032 Chronic diastolic (congestive) heart failure: Secondary | ICD-10-CM | POA: Diagnosis not present

## 2022-10-04 DIAGNOSIS — E1122 Type 2 diabetes mellitus with diabetic chronic kidney disease: Secondary | ICD-10-CM | POA: Diagnosis not present

## 2022-10-04 DIAGNOSIS — Z01818 Encounter for other preprocedural examination: Secondary | ICD-10-CM | POA: Diagnosis not present

## 2022-10-04 DIAGNOSIS — N184 Chronic kidney disease, stage 4 (severe): Secondary | ICD-10-CM | POA: Diagnosis not present

## 2022-10-04 DIAGNOSIS — I2510 Atherosclerotic heart disease of native coronary artery without angina pectoris: Secondary | ICD-10-CM | POA: Diagnosis not present

## 2022-10-04 DIAGNOSIS — I110 Hypertensive heart disease with heart failure: Secondary | ICD-10-CM | POA: Diagnosis not present

## 2022-10-04 DIAGNOSIS — D132 Benign neoplasm of duodenum: Secondary | ICD-10-CM | POA: Diagnosis not present

## 2022-10-04 DIAGNOSIS — Z9189 Other specified personal risk factors, not elsewhere classified: Secondary | ICD-10-CM | POA: Diagnosis not present

## 2022-10-04 DIAGNOSIS — D509 Iron deficiency anemia, unspecified: Secondary | ICD-10-CM | POA: Diagnosis not present

## 2022-10-11 DIAGNOSIS — D132 Benign neoplasm of duodenum: Secondary | ICD-10-CM | POA: Diagnosis not present

## 2022-10-26 ENCOUNTER — Inpatient Hospital Stay: Payer: Medicare HMO | Attending: Oncology

## 2022-10-26 DIAGNOSIS — D500 Iron deficiency anemia secondary to blood loss (chronic): Secondary | ICD-10-CM

## 2022-10-26 DIAGNOSIS — D509 Iron deficiency anemia, unspecified: Secondary | ICD-10-CM | POA: Diagnosis not present

## 2022-10-26 DIAGNOSIS — N183 Chronic kidney disease, stage 3 unspecified: Secondary | ICD-10-CM | POA: Insufficient documentation

## 2022-10-26 LAB — CBC WITH DIFFERENTIAL/PLATELET
Abs Immature Granulocytes: 0.02 10*3/uL (ref 0.00–0.07)
Basophils Absolute: 0 10*3/uL (ref 0.0–0.1)
Basophils Relative: 1 %
Eosinophils Absolute: 0.2 10*3/uL (ref 0.0–0.5)
Eosinophils Relative: 3 %
HCT: 29.7 % — ABNORMAL LOW (ref 36.0–46.0)
Hemoglobin: 8.9 g/dL — ABNORMAL LOW (ref 12.0–15.0)
Immature Granulocytes: 0 %
Lymphocytes Relative: 10 %
Lymphs Abs: 0.5 10*3/uL — ABNORMAL LOW (ref 0.7–4.0)
MCH: 30.1 pg (ref 26.0–34.0)
MCHC: 30 g/dL (ref 30.0–36.0)
MCV: 100.3 fL — ABNORMAL HIGH (ref 80.0–100.0)
Monocytes Absolute: 0.4 10*3/uL (ref 0.1–1.0)
Monocytes Relative: 8 %
Neutro Abs: 3.9 10*3/uL (ref 1.7–7.7)
Neutrophils Relative %: 78 %
Platelets: 165 10*3/uL (ref 150–400)
RBC: 2.96 MIL/uL — ABNORMAL LOW (ref 3.87–5.11)
RDW: 16.8 % — ABNORMAL HIGH (ref 11.5–15.5)
WBC: 5 10*3/uL (ref 4.0–10.5)
nRBC: 0 % (ref 0.0–0.2)

## 2022-10-26 LAB — IRON AND TIBC
Iron: 34 ug/dL (ref 28–170)
Saturation Ratios: 10 % — ABNORMAL LOW (ref 10.4–31.8)
TIBC: 328 ug/dL (ref 250–450)
UIBC: 294 ug/dL

## 2022-10-26 LAB — FERRITIN: Ferritin: 25 ng/mL (ref 11–307)

## 2022-10-26 LAB — SAMPLE TO BLOOD BANK

## 2022-10-27 ENCOUNTER — Inpatient Hospital Stay (HOSPITAL_BASED_OUTPATIENT_CLINIC_OR_DEPARTMENT_OTHER): Payer: Medicare HMO | Admitting: Oncology

## 2022-10-27 ENCOUNTER — Encounter: Payer: Self-pay | Admitting: Oncology

## 2022-10-27 ENCOUNTER — Inpatient Hospital Stay: Payer: Medicare HMO

## 2022-10-27 VITALS — BP 124/81 | HR 83 | Temp 98.0°F

## 2022-10-27 VITALS — BP 126/83 | HR 84 | Temp 97.9°F

## 2022-10-27 DIAGNOSIS — D631 Anemia in chronic kidney disease: Secondary | ICD-10-CM | POA: Diagnosis not present

## 2022-10-27 DIAGNOSIS — D132 Benign neoplasm of duodenum: Secondary | ICD-10-CM | POA: Diagnosis not present

## 2022-10-27 DIAGNOSIS — D500 Iron deficiency anemia secondary to blood loss (chronic): Secondary | ICD-10-CM | POA: Diagnosis not present

## 2022-10-27 DIAGNOSIS — N183 Chronic kidney disease, stage 3 unspecified: Secondary | ICD-10-CM | POA: Diagnosis not present

## 2022-10-27 DIAGNOSIS — D508 Other iron deficiency anemias: Secondary | ICD-10-CM

## 2022-10-27 DIAGNOSIS — N184 Chronic kidney disease, stage 4 (severe): Secondary | ICD-10-CM

## 2022-10-27 DIAGNOSIS — D509 Iron deficiency anemia, unspecified: Secondary | ICD-10-CM | POA: Diagnosis not present

## 2022-10-27 MED ORDER — SODIUM CHLORIDE 0.9 % IV SOLN
Freq: Once | INTRAVENOUS | Status: AC
  Filled 2022-10-27: qty 250

## 2022-10-27 MED ORDER — SODIUM CHLORIDE 0.9 % IV SOLN
200.0000 mg | Freq: Once | INTRAVENOUS | Status: AC
  Administered 2022-10-27: 200 mg via INTRAVENOUS
  Filled 2022-10-27: qty 200

## 2022-10-27 NOTE — Progress Notes (Signed)
Pt here for follow up. Pt states that that she had a polyp near duodenum and saw surgeon for possible removal but was told that it was a hight risk procedure. Pt reports no pain or discomfort.

## 2022-10-27 NOTE — Assessment & Plan Note (Addendum)
Labs reviewed and discussed with patient. severe iron deficiency anemia. Suspect chronic occult blood loss from GI tract.  Patient has extensive GI workup. S/p IV Venofer, Labs are reviewed and discussed with patient. Lab Results  Component Value Date   IRON 34 10/26/2022   TIBC 328 10/26/2022   IRONPCTSAT 10 (L) 10/26/2022   FERRITIN 25 10/26/2022   HGB 8.9 (L) 10/26/2022    Recommend IV Venofer weekly x 5.

## 2022-10-27 NOTE — Progress Notes (Signed)
Pt declined 30 minute post observation. Pt educated on risks and verbalized understanding.

## 2022-10-27 NOTE — Progress Notes (Signed)
Hematology/Oncology Progress note [phone] [phone]      CHIEF COMPLAINTS/REASON FOR VISIT:  Iron deficiency anemia  ASSESSMENT & PLAN:   IDA (iron deficiency anemia) Labs reviewed and discussed with patient. severe iron deficiency anemia. Suspect chronic occult blood loss from GI tract.  Patient has extensive GI workup. S/p IV Venofer, Labs are reviewed and discussed with patient. Lab Results  Component Value Date   IRON 34 10/26/2022   TIBC 328 10/26/2022   IRONPCTSAT 10 (L) 10/26/2022   FERRITIN 25 10/26/2022   HGB 8.9 (L) 10/26/2022    Recommend IV Venofer weekly x 5.    Anemia in chronic kidney disease (CKD) Continue close monitor.   Consider erythropoietin replacement if hemoglobin is persistent less than 10 despite increased iron store.  Adenomatous duodenal polyp Patient declined resection due to concern of procedure complications.    Orders Placed This Encounter  Procedures   CBC with Differential (Cancer Center Only)    Standing Status:   Future    Standing Expiration Date:   10/27/2023   Ferritin    Standing Status:   Future    Standing Expiration Date:   10/27/2023   Retic Panel    Standing Status:   Future    Standing Expiration Date:   10/27/2023   Iron and TIBC    Standing Status:   Future    Standing Expiration Date:   10/27/2023   Follow-up in 2 months. All questions were answered. The patient knows to call the clinic with any problems, questions or concerns.  Rickard Patience, MD, PhD [facility] Hematology Oncology 10/27/2022     HISTORY OF PRESENTING ILLNESS:  Melissa Christian is a  82 y.o.  female with PMH listed below who was referred to me for anemia Reviewed patient's recent labs that was done.  She was found to have abnormal CBC on 02/22/2022, with a hemoglobin of 8.4, MCV 83.5.  B12 adequate, patient was advised to take ferrous sulfate 325 mg twice daily.  She had a repeat hemoglobin 03/31/2022 which was 7.9, ferritin  9.  Reviewed patient's previous labs ordered by primary care physician's office, anemia is chronic onset , since early 2023. Patient reports feeling tired.  No energy. She denies recent chest pain on exertion, pre-syncopal episodes, or palpitations She had not noticed any recent bleeding such as epistaxis, hematuria or hematochezia.  She denies over the counter NSAID ingestion. She is on antiplatelets agent-aspirin 81 mg. Continue had a colonoscopy. She was accompanied by her sister.  INTERVAL HISTORY Melissa Christian is a 82 y.o. female who has above history reviewed by me today presents for follow up visit for iron deficiency anemia.  S/p IV venofer.  Patient reports fatigue level has improved. During the interval, patient establish with gastroenterology and had GI workup. 06/26/2022 colonoscopy showed entire examined colon is normal.  Examined portion of ileum was normal. Upper endoscopy showed normal esophagus.  A few gastric polyps.  A single duodenal polyp biopsied.-Pathology showed duodenum tubular adenoma 07/06/2022 capsule study showed duodenal polyp, otherwise normal study.  INTERVAL HISTORY Melissa Christian is a 82 y.o. female who has above history reviewed by me today presents for follow up visit for anemia.  + worse fatigue. Denies hematochezia, hematuria, hematemesis, epistaxis, black tarry stool or easy bruising.    MEDICAL HISTORY:  Past Medical History:  Diagnosis Date   Anemia    CAD (coronary artery disease)    a. NSTEMI 09/2015: cardiac cath: LM no  obs dz, mLAD 95% s/p PCI/DES 0%, LCx no obs, RCA no obs, LVEF 55-65%   Chronic kidney disease (CKD), stage III (moderate) (HCC)    Diabetes mellitus with complication (HCC)    HLD (hyperlipidemia)    Hypertension    Low blood potassium    NSTEMI (non-ST elevated myocardial infarction) (HCC)    PONV (postoperative nausea and vomiting)    Valvular heart disease    a. 09/2015: EF 55-60%, challenging images unable to exclude  mild mid-distal anterior to apical HK, nl LV dias fxn, mild AI/MR, normal size of left atrium, RV systolic function normal, PASP normal      SURGICAL HISTORY: Past Surgical History:  Procedure Laterality Date   BACK SURGERY     CARDIAC CATHETERIZATION N/A 09/23/2015   Procedure: Right and Left Heart Cath;  Surgeon: Antonieta Iba, MD;  Location: [facility] INVASIVE CV LAB;  Service: Cardiovascular;  Laterality: N/A;   CARDIAC CATHETERIZATION N/A 09/23/2015   Procedure: Coronary Stent Intervention;  Surgeon: Alwyn Pea, MD;  Location: [facility] INVASIVE CV LAB;  Service: Cardiovascular;  Laterality: N/A;   CATARACT EXTRACTION W/PHACO Left 04/26/2017   Procedure: CATARACT EXTRACTION PHACO AND INTRAOCULAR LENS PLACEMENT (I[facility]) LEFT DIABETIC;  Surgeon: Lockie Mola, MD;  Location: [facility] SURGERY CNTR;  Service: Ophthalmology;  Laterality: Left;  diabetic-oral meds   CATARACT EXTRACTION W/PHACO Right 05/31/2017   Procedure: CATARACT EXTRACTION PHACO AND INTRAOCULAR LENS PLACEMENT ([facility]) RIGHT DIABETIC;  Surgeon: Lockie Mola, MD;  Location: [facility] SURGERY [facility];  Service: Ophthalmology;  Laterality: Right;   CHOLECYSTECTOMY N/A 06/16/2016   Procedure: LAPAROSCOPIC CHOLECYSTECTOMY WITH INTRAOPERATIVE CHOLANGIOGRAM;  Surgeon: Nadeen Landau, MD;  Location: [facility] ORS;  Service: General;  Laterality: N/A;   COLONOSCOPY WITH PROPOFOL N/A 06/26/2022   Procedure: COLONOSCOPY WITH PROPOFOL;  Surgeon: Wyline Mood, MD;  Location: [facility] ENDOSCOPY;  Service: Gastroenterology;  Laterality: N/A;   CORONARY STENT PLACEMENT     ESOPHAGOGASTRODUODENOSCOPY N/A 06/30/2022   Procedure: ESOPHAGOGASTRODUODENOSCOPY (EGD);  Surgeon: Toney Reil, MD;  Location: [facility] ENDOSCOPY;  Service: Gastroenterology;  Laterality: N/A;   ESOPHAGOGASTRODUODENOSCOPY (EGD) WITH PROPOFOL N/A 06/26/2022   Procedure: ESOPHAGOGASTRODUODENOSCOPY (EGD) WITH PROPOFOL;  Surgeon: Wyline Mood, MD;  Location: [facility] ENDOSCOPY;   Service: Gastroenterology;  Laterality: N/A;   GIVENS CAPSULE STUDY N/A 06/30/2022   Procedure: GIVENS CAPSULE STUDY;  Surgeon: Toney Reil, MD;  Location: [facility] ENDOSCOPY;  Service: Gastroenterology;  Laterality: N/A;  PLACE CAPSULE DURING EGD   JOINT REPLACEMENT     LUMBAR FUSION     TOTAL HIP ARTHROPLASTY      SOCIAL HISTORY: Social History   Socioeconomic History   Marital status: Widowed    Spouse name: Not on file   Number of children: Not on file   Years of education: Not on file   Highest education level: Not on file  Occupational History   Not on file  Tobacco Use   Smoking status: Never   Smokeless tobacco: Never  Vaping Use   Vaping Use: Never used  Substance and Sexual Activity   Alcohol use: No    Alcohol/week: 0.0 standard drinks of alcohol   Drug use: No   Sexual activity: Not Currently    Birth control/protection: None, Post-menopausal  Other Topics Concern   Not on file  Social History Narrative   Not on file   Social Determinants of Health   Financial Resource Strain: Not on file  Food Insecurity: No Food Insecurity (06/26/2022)   Hunger Vital Sign  Worried About Programme researcher, broadcasting/film/video in the Last Year: Never true    Ran Out of Food in the Last Year: Never true  Transportation Needs: No Transportation Needs (06/26/2022)   PRAPARE - Administrator, Civil Service (Medical): No    Lack of Transportation (Non-Medical): No  Physical Activity: Not on file  Stress: Not on file  Social Connections: Not on file  Intimate Partner Violence: Not At Risk (06/26/2022)   Humiliation, Afraid, Rape, and Kick questionnaire    Fear of Current or Ex-Partner: No    Emotionally Abused: No    Physically Abused: No    Sexually Abused: No    FAMILY HISTORY: Family History  Problem Relation Age of Onset   Diabetes Mother    Hypertension Mother    Heart disease Father    Cancer Maternal Grandfather     ALLERGIES:  is allergic to ferrous  sulfate, keflex [cephalexin], percocet [oxycodone-acetaminophen], vicodin [hydrocodone-acetaminophen], ace inhibitors, and penicillins.  MEDICATIONS:  Current Outpatient Medications  Medication Sig Dispense Refill   aspirin EC 81 MG tablet Take 81 mg by mouth daily.     Cholecalciferol (VITAMIN D3) 2000 units TABS Take 2,000 Units by mouth daily.     doxazosin (CARDURA) 2 MG tablet Take 1 tablet (2 mg total) by mouth 2 (two) times daily. 180 tablet 3   ferrous gluconate (FERGON) 324 MG tablet Take 1 tablet (324 mg total) by mouth daily with breakfast. 60 tablet 3   furosemide (LASIX) 40 MG tablet Take 1 tablet (40 mg total) by mouth daily. 90 tablet 3   metoprolol succinate (TOPROL-XL) 100 MG 24 hr tablet Take 1 tablet (100 mg total) by mouth daily. 90 tablet 1   Multiple Vitamins-Minerals (MULTIVITAMIN WITH MINERALS) tablet Take 1 tablet by mouth daily. 120 tablet 2   pantoprazole (PROTONIX) 40 MG tablet Take 1 tablet (40 mg total) by mouth daily. 30 tablet 1   pioglitazone (ACTOS) 30 MG tablet Take 30 mg by mouth daily.      rosuvastatin (CRESTOR) 40 MG tablet Take 1 tablet (40 mg total) by mouth daily. 90 tablet 3   TRULICITY 1.5 MG/0.5ML SOPN INJECT 1.5 MG SUBCUTANEOUSLY EVERY 7 (SEVEN) DAYS     No current facility-administered medications for this visit.    Review of Systems  Constitutional:  Positive for fatigue. Negative for appetite change, chills and fever.  HENT:   Negative for hearing loss and voice change.   Eyes:  Negative for eye problems.  Respiratory:  Negative for chest tightness and cough.   Cardiovascular:  Negative for chest pain.  Gastrointestinal:  Negative for abdominal distention, abdominal pain and blood in stool.  Endocrine: Negative for hot flashes.  Genitourinary:  Negative for difficulty urinating and frequency.   Musculoskeletal:  Negative for arthralgias.  Skin:  Negative for itching and rash.  Neurological:  Negative for extremity weakness.   Hematological:  Negative for adenopathy.  Psychiatric/Behavioral:  Negative for confusion.     PHYSICAL EXAMINATION: ECOG PERFORMANCE STATUS: 2 - Symptomatic, <50% confined to bed Vitals:   10/27/22 1345  BP: 124/81  Pulse: 83  Temp: 98 F (36.7 C)  SpO2: 95%   Filed Weights     Physical Exam Constitutional:      General: She is not in acute distress.    Appearance: She is obese.     Comments: Patient sits in the wheelchair  HENT:     Head: Normocephalic and atraumatic.  Eyes:  General: No scleral icterus. Cardiovascular:     Rate and Rhythm: Normal rate and regular rhythm.     Heart sounds: Murmur heard.  Pulmonary:     Effort: Pulmonary effort is normal. No respiratory distress.     Breath sounds: No wheezing.  Abdominal:     General: Bowel sounds are normal. There is no distension.     Palpations: Abdomen is soft.  Musculoskeletal:        General: No deformity. Normal range of motion.     Cervical back: Normal range of motion and neck supple.  Skin:    General: Skin is warm and dry.     Coloration: Skin is not pale.     Findings: No erythema.  Neurological:     Mental Status: She is alert and oriented to person, place, and time. Mental status is at baseline.     Cranial Nerves: No cranial nerve deficit.  Psychiatric:        Mood and Affect: Mood normal.      LABORATORY DATA:  I have reviewed the data as listed    Latest Ref Rng & Units 10/26/2022    1:01 PM 07/27/2022   10:52 AM 06/30/2022    3:21 AM  CBC  WBC 4.0 - 10.5 K/uL 5.0  6.0  5.9   Hemoglobin 12.0 - 15.0 g/dL 8.9  40.9  8.4   Hematocrit 36.0 - 46.0 % 29.7  35.0  29.2   Platelets 150 - 400 K/uL 165  194  169       Latest Ref Rng & Units 06/30/2022    3:21 AM 06/28/2022    6:27 AM 06/27/2022    9:09 AM  CMP  Glucose 70 - 99 mg/dL 811  914  782   BUN 8 - 23 mg/dL 22  30  32   Creatinine 0.44 - 1.00 mg/dL 9.56  2.13  0.86   Sodium 135 - 145 mmol/L 140  141  140   Potassium 3.5 -  5.1 mmol/L 4.4  4.1  4.1   Chloride 98 - 111 mmol/L 110  110  108   CO2 22 - 32 mmol/L 27  24  25    Calcium 8.9 - 10.3 mg/dL 8.0  8.1  8.0       Component Value Date/Time   IRON 34 10/26/2022 1301   TIBC 328 10/26/2022 1301   FERRITIN 25 10/26/2022 1301   IRONPCTSAT 10 (L) 10/26/2022 1301     RADIOGRAPHIC STUDIES: I have personally reviewed the radiological images as listed and agreed with the findings in the report. No results found.

## 2022-10-27 NOTE — Assessment & Plan Note (Signed)
Patient declined resection due to concern of procedure complications.

## 2022-10-27 NOTE — Assessment & Plan Note (Signed)
Continue close monitor.   Consider erythropoietin replacement if hemoglobin is persistent less than 10 despite increased iron store.

## 2022-10-31 ENCOUNTER — Telehealth: Payer: Self-pay | Admitting: Cardiovascular Disease

## 2022-10-31 NOTE — Telephone Encounter (Signed)
Pt c/o medication issue:  1. Name of Medication:   aspirin EC 81 MG tablet    2. How are you currently taking this medication (dosage and times per day)?    3. Are you having a reaction (difficulty breathing--STAT)? no  4. What is your medication issue? Calling to see if she still should take medication. Patient is having some issues, seeing if medication cause of it. Please advise

## 2022-11-02 MED FILL — Iron Sucrose Inj 20 MG/ML (Fe Equiv): INTRAVENOUS | Qty: 10 | Status: AC

## 2022-11-03 ENCOUNTER — Inpatient Hospital Stay: Payer: Medicare HMO | Attending: Oncology

## 2022-11-03 VITALS — BP 108/62 | HR 73 | Temp 98.0°F | Resp 18

## 2022-11-03 DIAGNOSIS — D509 Iron deficiency anemia, unspecified: Secondary | ICD-10-CM | POA: Insufficient documentation

## 2022-11-03 DIAGNOSIS — D508 Other iron deficiency anemias: Secondary | ICD-10-CM

## 2022-11-03 MED ORDER — SODIUM CHLORIDE 0.9 % IV SOLN
200.0000 mg | Freq: Once | INTRAVENOUS | Status: AC
  Administered 2022-11-03: 200 mg via INTRAVENOUS
  Filled 2022-11-03: qty 200

## 2022-11-03 MED ORDER — SODIUM CHLORIDE 0.9 % IV SOLN
INTRAVENOUS | Status: DC
  Filled 2022-11-03: qty 250

## 2022-11-03 NOTE — Progress Notes (Signed)
Pt tolerated treatment without complaints.  VSS.  Pt refused 30 minute post observation.  Pt understands risk.

## 2022-11-03 NOTE — Patient Instructions (Signed)

## 2022-11-04 DIAGNOSIS — I2781 Cor pulmonale (chronic): Secondary | ICD-10-CM | POA: Diagnosis not present

## 2022-11-04 DIAGNOSIS — R600 Localized edema: Secondary | ICD-10-CM | POA: Diagnosis not present

## 2022-11-04 DIAGNOSIS — I272 Pulmonary hypertension, unspecified: Secondary | ICD-10-CM | POA: Diagnosis not present

## 2022-11-04 DIAGNOSIS — I350 Nonrheumatic aortic (valve) stenosis: Secondary | ICD-10-CM | POA: Diagnosis not present

## 2022-11-07 NOTE — Telephone Encounter (Signed)
Spoke with patient and informed her of Dr. Windell Hummingbird recommendation(s) as follows: "Given history of coronary disease and prior stenting needs to stay on either aspirin 81 daily or Plavix 75 mg daily, either would be fine."  Patient understood with read back and stated she would stay on the aspirin 81 mg

## 2022-11-09 MED FILL — Iron Sucrose Inj 20 MG/ML (Fe Equiv): INTRAVENOUS | Qty: 10 | Status: AC

## 2022-11-10 ENCOUNTER — Inpatient Hospital Stay
Admission: EM | Admit: 2022-11-10 | Discharge: 2022-11-15 | DRG: 811 | Disposition: A | Discharge status: Home Health | Payer: Medicare HMO | Source: Ambulatory Visit | Attending: Student | Admitting: Student

## 2022-11-10 ENCOUNTER — Other Ambulatory Visit: Payer: Self-pay

## 2022-11-10 ENCOUNTER — Inpatient Hospital Stay: Payer: Medicare HMO

## 2022-11-10 ENCOUNTER — Encounter: Payer: Self-pay | Admitting: Emergency Medicine

## 2022-11-10 ENCOUNTER — Emergency Department: Payer: Medicare HMO

## 2022-11-10 VITALS — BP 100/52 | HR 78 | Temp 98.2°F | Resp 17

## 2022-11-10 DIAGNOSIS — R609 Edema, unspecified: Secondary | ICD-10-CM | POA: Diagnosis not present

## 2022-11-10 DIAGNOSIS — I959 Hypotension, unspecified: Secondary | ICD-10-CM | POA: Diagnosis not present

## 2022-11-10 DIAGNOSIS — N179 Acute kidney failure, unspecified: Secondary | ICD-10-CM | POA: Diagnosis not present

## 2022-11-10 DIAGNOSIS — E119 Type 2 diabetes mellitus without complications: Secondary | ICD-10-CM

## 2022-11-10 DIAGNOSIS — N184 Chronic kidney disease, stage 4 (severe): Secondary | ICD-10-CM | POA: Diagnosis present

## 2022-11-10 DIAGNOSIS — R946 Abnormal results of thyroid function studies: Secondary | ICD-10-CM | POA: Diagnosis present

## 2022-11-10 DIAGNOSIS — Z955 Presence of coronary angioplasty implant and graft: Secondary | ICD-10-CM | POA: Diagnosis not present

## 2022-11-10 DIAGNOSIS — D631 Anemia in chronic kidney disease: Secondary | ICD-10-CM | POA: Diagnosis present

## 2022-11-10 DIAGNOSIS — D62 Acute posthemorrhagic anemia: Secondary | ICD-10-CM | POA: Diagnosis not present

## 2022-11-10 DIAGNOSIS — R0602 Shortness of breath: Secondary | ICD-10-CM | POA: Diagnosis not present

## 2022-11-10 DIAGNOSIS — E8770 Fluid overload, unspecified: Secondary | ICD-10-CM | POA: Diagnosis not present

## 2022-11-10 DIAGNOSIS — Z7982 Long term (current) use of aspirin: Secondary | ICD-10-CM | POA: Diagnosis not present

## 2022-11-10 DIAGNOSIS — I129 Hypertensive chronic kidney disease with stage 1 through stage 4 chronic kidney disease, or unspecified chronic kidney disease: Secondary | ICD-10-CM | POA: Diagnosis not present

## 2022-11-10 DIAGNOSIS — Z7985 Long-term (current) use of injectable non-insulin antidiabetic drugs: Secondary | ICD-10-CM

## 2022-11-10 DIAGNOSIS — K5900 Constipation, unspecified: Secondary | ICD-10-CM | POA: Diagnosis not present

## 2022-11-10 DIAGNOSIS — Z9049 Acquired absence of other specified parts of digestive tract: Secondary | ICD-10-CM

## 2022-11-10 DIAGNOSIS — I2489 Other forms of acute ischemic heart disease: Secondary | ICD-10-CM | POA: Diagnosis present

## 2022-11-10 DIAGNOSIS — K921 Melena: Secondary | ICD-10-CM

## 2022-11-10 DIAGNOSIS — Z96649 Presence of unspecified artificial hip joint: Secondary | ICD-10-CM | POA: Diagnosis present

## 2022-11-10 DIAGNOSIS — E876 Hypokalemia: Secondary | ICD-10-CM | POA: Diagnosis present

## 2022-11-10 DIAGNOSIS — D508 Other iron deficiency anemias: Secondary | ICD-10-CM

## 2022-11-10 DIAGNOSIS — K922 Gastrointestinal hemorrhage, unspecified: Secondary | ICD-10-CM

## 2022-11-10 DIAGNOSIS — D509 Iron deficiency anemia, unspecified: Secondary | ICD-10-CM | POA: Diagnosis not present

## 2022-11-10 DIAGNOSIS — Z6841 Body Mass Index (BMI) 40.0 and over, adult: Secondary | ICD-10-CM | POA: Diagnosis not present

## 2022-11-10 DIAGNOSIS — D649 Anemia, unspecified: Secondary | ICD-10-CM | POA: Diagnosis not present

## 2022-11-10 DIAGNOSIS — D132 Benign neoplasm of duodenum: Secondary | ICD-10-CM | POA: Diagnosis present

## 2022-11-10 DIAGNOSIS — I130 Hypertensive heart and chronic kidney disease with heart failure and stage 1 through stage 4 chronic kidney disease, or unspecified chronic kidney disease: Secondary | ICD-10-CM | POA: Diagnosis not present

## 2022-11-10 DIAGNOSIS — E785 Hyperlipidemia, unspecified: Secondary | ICD-10-CM | POA: Diagnosis present

## 2022-11-10 DIAGNOSIS — I5033 Acute on chronic diastolic (congestive) heart failure: Secondary | ICD-10-CM | POA: Diagnosis not present

## 2022-11-10 DIAGNOSIS — I2510 Atherosclerotic heart disease of native coronary artery without angina pectoris: Secondary | ICD-10-CM | POA: Diagnosis present

## 2022-11-10 DIAGNOSIS — D500 Iron deficiency anemia secondary to blood loss (chronic): Secondary | ICD-10-CM

## 2022-11-10 DIAGNOSIS — Z981 Arthrodesis status: Secondary | ICD-10-CM

## 2022-11-10 DIAGNOSIS — E1159 Type 2 diabetes mellitus with other circulatory complications: Secondary | ICD-10-CM

## 2022-11-10 DIAGNOSIS — Z885 Allergy status to narcotic agent status: Secondary | ICD-10-CM

## 2022-11-10 DIAGNOSIS — I252 Old myocardial infarction: Secondary | ICD-10-CM | POA: Diagnosis not present

## 2022-11-10 DIAGNOSIS — Z833 Family history of diabetes mellitus: Secondary | ICD-10-CM

## 2022-11-10 DIAGNOSIS — Z7984 Long term (current) use of oral hypoglycemic drugs: Secondary | ICD-10-CM

## 2022-11-10 DIAGNOSIS — I509 Heart failure, unspecified: Secondary | ICD-10-CM | POA: Diagnosis not present

## 2022-11-10 DIAGNOSIS — R7989 Other specified abnormal findings of blood chemistry: Secondary | ICD-10-CM | POA: Diagnosis present

## 2022-11-10 DIAGNOSIS — Z880 Allergy status to penicillin: Secondary | ICD-10-CM

## 2022-11-10 DIAGNOSIS — Z881 Allergy status to other antibiotic agents status: Secondary | ICD-10-CM

## 2022-11-10 DIAGNOSIS — E1122 Type 2 diabetes mellitus with diabetic chronic kidney disease: Secondary | ICD-10-CM | POA: Diagnosis not present

## 2022-11-10 DIAGNOSIS — Z79899 Other long term (current) drug therapy: Secondary | ICD-10-CM | POA: Diagnosis not present

## 2022-11-10 DIAGNOSIS — R531 Weakness: Secondary | ICD-10-CM | POA: Diagnosis not present

## 2022-11-10 DIAGNOSIS — Z8249 Family history of ischemic heart disease and other diseases of the circulatory system: Secondary | ICD-10-CM | POA: Diagnosis not present

## 2022-11-10 DIAGNOSIS — J90 Pleural effusion, not elsewhere classified: Secondary | ICD-10-CM | POA: Diagnosis not present

## 2022-11-10 LAB — PROTIME-INR
INR: 1.3 — ABNORMAL HIGH (ref 0.8–1.2)
Prothrombin Time: 16.6 seconds — ABNORMAL HIGH (ref 11.4–15.2)

## 2022-11-10 LAB — COMPREHENSIVE METABOLIC PANEL
ALT: 13 U/L (ref 0–44)
AST: 30 U/L (ref 15–41)
Albumin: 3.7 g/dL (ref 3.5–5.0)
Alkaline Phosphatase: 96 U/L (ref 38–126)
Anion gap: 11 (ref 5–15)
BUN: 59 mg/dL — ABNORMAL HIGH (ref 8–23)
CO2: 26 mmol/L (ref 22–32)
Calcium: 8.6 mg/dL — ABNORMAL LOW (ref 8.9–10.3)
Chloride: 100 mmol/L (ref 98–111)
Creatinine, Ser: 2.8 mg/dL — ABNORMAL HIGH (ref 0.44–1.00)
GFR, Estimated: 16 mL/min — ABNORMAL LOW (ref >60)
Glucose, Bld: 183 mg/dL — ABNORMAL HIGH (ref 70–99)
Potassium: 3.8 mmol/L (ref 3.5–5.1)
Sodium: 137 mmol/L (ref 135–145)
Total Bilirubin: 0.8 mg/dL (ref 0.3–1.2)
Total Protein: 7.2 g/dL (ref 6.5–8.1)

## 2022-11-10 LAB — CBC WITH DIFFERENTIAL/PLATELET
Abs Immature Granulocytes: 0.03 10*3/uL (ref 0.00–0.07)
Basophils Absolute: 0 10*3/uL (ref 0.0–0.1)
Basophils Relative: 1 %
Eosinophils Absolute: 0.1 10*3/uL (ref 0.0–0.5)
Eosinophils Relative: 1 %
HCT: 28.2 % — ABNORMAL LOW (ref 36.0–46.0)
Hemoglobin: 8.2 g/dL — ABNORMAL LOW (ref 12.0–15.0)
Immature Granulocytes: 1 %
Lymphocytes Relative: 9 %
Lymphs Abs: 0.4 10*3/uL — ABNORMAL LOW (ref 0.7–4.0)
MCH: 29.8 pg (ref 26.0–34.0)
MCHC: 29.1 g/dL — ABNORMAL LOW (ref 30.0–36.0)
MCV: 102.5 fL — ABNORMAL HIGH (ref 80.0–100.0)
Monocytes Absolute: 0.3 10*3/uL (ref 0.1–1.0)
Monocytes Relative: 7 %
Neutro Abs: 3.9 10*3/uL (ref 1.7–7.7)
Neutrophils Relative %: 81 %
Platelets: 210 10*3/uL (ref 150–400)
RBC: 2.75 MIL/uL — ABNORMAL LOW (ref 3.87–5.11)
RDW: 16.3 % — ABNORMAL HIGH (ref 11.5–15.5)
WBC: 4.7 10*3/uL (ref 4.0–10.5)
nRBC: 0 % (ref 0.0–0.2)

## 2022-11-10 LAB — CBC (CANCER CENTER ONLY)
HCT: 26 % — ABNORMAL LOW (ref 36.0–46.0)
Hemoglobin: 7.7 g/dL — ABNORMAL LOW (ref 12.0–15.0)
MCH: 30.1 pg (ref 26.0–34.0)
MCHC: 29.6 g/dL — ABNORMAL LOW (ref 30.0–36.0)
MCV: 101.6 fL — ABNORMAL HIGH (ref 80.0–100.0)
Platelet Count: 188 10*3/uL (ref 150–400)
RBC: 2.56 MIL/uL — ABNORMAL LOW (ref 3.87–5.11)
RDW: 16.2 % — ABNORMAL HIGH (ref 11.5–15.5)
WBC Count: 4.6 10*3/uL (ref 4.0–10.5)
nRBC: 0 % (ref 0.0–0.2)

## 2022-11-10 LAB — TROPONIN I (HIGH SENSITIVITY)
Troponin I (High Sensitivity): 19 ng/L — ABNORMAL HIGH (ref <18)
Troponin I (High Sensitivity): 20 ng/L — ABNORMAL HIGH (ref <18)

## 2022-11-10 LAB — TYPE AND SCREEN: ABO/RH(D): A POS

## 2022-11-10 LAB — BPAM RBC
Blood Product Expiration Date: 202405212359
Blood Product Expiration Date: 202405242359

## 2022-11-10 LAB — PREPARE RBC (CROSSMATCH)

## 2022-11-10 MED ORDER — SODIUM CHLORIDE 0.9% FLUSH
10.0000 mL | Freq: Once | INTRAVENOUS | Status: AC | PRN
  Administered 2022-11-10: 10 mL
  Filled 2022-11-10: qty 10

## 2022-11-10 MED ORDER — ROSUVASTATIN CALCIUM 10 MG PO TABS
40.0000 mg | ORAL_TABLET | Freq: Every day | ORAL | Status: DC
  Administered 2022-11-11 – 2022-11-15 (×5): 40 mg via ORAL
  Filled 2022-11-10 (×2): qty 4
  Filled 2022-11-10: qty 2
  Filled 2022-11-10: qty 4
  Filled 2022-11-10: qty 2

## 2022-11-10 MED ORDER — SODIUM CHLORIDE 0.9 % IV SOLN
10.0000 mL/h | Freq: Once | INTRAVENOUS | Status: AC
  Administered 2022-11-10: 10 mL/h via INTRAVENOUS

## 2022-11-10 MED ORDER — SODIUM CHLORIDE 0.9 % IV SOLN: INTRAVENOUS | Status: DC

## 2022-11-10 MED ORDER — PANTOPRAZOLE 80MG IVPB - SIMPLE MED
80.0000 mg | Freq: Once | INTRAVENOUS | Status: AC
  Administered 2022-11-10: 80 mg via INTRAVENOUS
  Filled 2022-11-10: qty 100

## 2022-11-10 MED ORDER — ACETAMINOPHEN 325 MG PO TABS
650.0000 mg | ORAL_TABLET | Freq: Four times a day (QID) | ORAL | Status: DC | PRN
  Administered 2022-11-14: 650 mg via ORAL
  Filled 2022-11-10: qty 2

## 2022-11-10 MED ORDER — SODIUM CHLORIDE 0.9 % IV SOLN
Freq: Once | INTRAVENOUS | Status: AC
  Filled 2022-11-10: qty 250

## 2022-11-10 MED ORDER — ACETAMINOPHEN 650 MG RE SUPP: 650.0000 mg | Freq: Four times a day (QID) | RECTAL | Status: DC | PRN

## 2022-11-10 MED ORDER — ONDANSETRON HCL 4 MG/2ML IJ SOLN: 4.0000 mg | Freq: Four times a day (QID) | INTRAMUSCULAR | Status: DC | PRN

## 2022-11-10 MED ORDER — SODIUM CHLORIDE 0.9 % IV SOLN
200.0000 mg | Freq: Once | INTRAVENOUS | Status: AC
  Administered 2022-11-10: 200 mg via INTRAVENOUS
  Filled 2022-11-10: qty 10

## 2022-11-10 MED ORDER — INSULIN ASPART 100 UNIT/ML IJ SOLN
0.0000 [IU] | INTRAMUSCULAR | Status: DC
  Administered 2022-11-11 – 2022-11-13 (×5): 3 [IU] via SUBCUTANEOUS
  Administered 2022-11-13: 4 [IU] via SUBCUTANEOUS
  Administered 2022-11-13: 2 [IU] via SUBCUTANEOUS
  Filled 2022-11-10 (×7): qty 1

## 2022-11-10 MED ORDER — ONDANSETRON HCL 4 MG PO TABS
4.0000 mg | ORAL_TABLET | Freq: Four times a day (QID) | ORAL | Status: DC | PRN
  Administered 2022-11-14: 4 mg via ORAL
  Filled 2022-11-10: qty 1

## 2022-11-10 NOTE — ED Notes (Signed)
Patient stated she felt wet, so her pants were removed, placed in a belongings bag, she was cleaned and placed in a fresh brief.  Linens were changed also at this time.  She was then repositioned in the bed.

## 2022-11-10 NOTE — Assessment & Plan Note (Deleted)
Renal function slightly up from baseline

## 2022-11-10 NOTE — Assessment & Plan Note (Signed)
Sliding scale insulin coverage 

## 2022-11-10 NOTE — Assessment & Plan Note (Signed)
No complaints of chest pain, EKG nonischemic Continue rosuvastatin.  Will hold aspirin and will also hold metoprolol due to soft blood pressure

## 2022-11-10 NOTE — Assessment & Plan Note (Signed)
Creatinine 2.8, up from baseline of 1.37, BUN 59, likely related to suspected bleeding/prerenal Expecting improvement to baseline with blood transfusion and hydration Continue to monitor

## 2022-11-10 NOTE — Patient Instructions (Signed)

## 2022-11-10 NOTE — Progress Notes (Signed)
Patient here for Venofer.  She is gapsing for breath and extreme fatigue. Denies other symptoms. Bilateral ankle edema +4. On RA 93%. Applied 2L 100%. Notified NP. Ordered CBC. Hgb 7.7, NP and Dr. Cathie Hoops aware and advised to go to ED. BP 81/60 P 67 from 100/52. 22 g IV L FA SL. Venofer administered and complete. Patient transported to ED. Report given to Mardella Layman, Charity fundraiser. Sister and son aware. Son is on the way to ED per sister

## 2022-11-10 NOTE — H&P (Signed)
History and Physical    Patient: Melissa Christian QIO:[id number] DOB: 07-17-1940 DOA: 11/10/2022 DOS: the patient was seen and examined on 11/10/2022 PCP: Danella Penton, MD  Patient coming from: Home  Chief Complaint:  Chief Complaint  Patient presents with   Shortness of Breath    HPI: Melissa Christian is a 82 y.o. female with medical history significant for morbid obesity, CAD, diabetes mellitus, stage IV CKD and hypertension, with chronic iron deficiency anemia due to suspected occult chronic GI blood loss but with negative extensive GI workup  in December 2023 during a hospitalization for acute blood loss anemia requiring 2 units PRBCs for hemoglobin of 6.7 (negative EGD, colonoscopy and capsule endoscopy), and who is currently on outpatient iron transfusions, who was sent from the hematology office for admission for symptomatic anemia.  Patient reportedly had an unexpected drop in her hemoglobin to 7.7 from 8.9 two weeks prior and additionally was symptomatic for dyspnea on exertion and weakness.  She reported having black stool, similar to when she was hospitalized back in December.  She denies chest pain.She received an iron transfusion prior to being sent to the ED.   ED course and data review: Vitals initially within normal limits though blood pressure was a bit soft at the time of admission at 117/42.  Hemoglobin 8.2, up from 7.7 earlier in the day at the hematology clinic.  Troponin 20.  Creatinine 2.8, up from her baseline of 1.37 and BUN 59. Stool guaiac positive. EKG, personally viewed and interpreted showing sinus at 70 with nonspecific ST-T wave changes. Chest x-ray showing cardiomegaly with trace pleural effusions. Patient was started on a Protonix infusion and a unit of PRBCs was ordered. Hospitalist consulted for admission.   Review of Systems: As mentioned in the history of present illness. All other systems reviewed and are negative.  Past Medical History:  Diagnosis Date    Anemia    CAD (coronary artery disease)    a. NSTEMI 09/2015: cardiac cath: LM no obs dz, mLAD 95% s/p PCI/DES 0%, LCx no obs, RCA no obs, LVEF 55-65%   Chronic kidney disease (CKD), stage III (moderate) (HCC)    Diabetes mellitus with complication (HCC)    HLD (hyperlipidemia)    Hypertension    Low blood potassium    NSTEMI (non-ST elevated myocardial infarction) (HCC)    PONV (postoperative nausea and vomiting)    Valvular heart disease    a. 09/2015: EF 55-60%, challenging images unable to exclude mild mid-distal anterior to apical HK, nl LV dias fxn, mild AI/MR, normal size of left atrium, RV systolic function normal, PASP normal     Past Surgical History:  Procedure Laterality Date   BACK SURGERY     CARDIAC CATHETERIZATION N/A 09/23/2015   Procedure: Right and Left Heart Cath;  Surgeon: Antonieta Iba, MD;  Location: [facility];  Service: Cardiovascular;  Laterality: N/A;   CARDIAC CATHETERIZATION N/A 09/23/2015   Procedure: Coronary Stent Intervention;  Surgeon: Alwyn Pea, MD;  Location: [facility];  Service: Cardiovascular;  Laterality: N/A;   CATARACT EXTRACTION W/PHACO Left 04/26/2017   Procedure: CATARACT EXTRACTION PHACO AND INTRAOCULAR LENS PLACEMENT (IOC) LEFT DIABETIC;  Surgeon: Lockie Mola, MD;  Location: [facility];  Service: Ophthalmology;  Laterality: Left;  diabetic-oral meds   CATARACT EXTRACTION W/PHACO Right 05/31/2017   Procedure: CATARACT EXTRACTION PHACO AND INTRAOCULAR LENS PLACEMENT (IOC) RIGHT DIABETIC;  Surgeon: Lockie Mola, MD;  Location: [facility];  Service: Ophthalmology;  Laterality: Right;   CHOLECYSTECTOMY N/A 06/16/2016   Procedure: LAPAROSCOPIC CHOLECYSTECTOMY WITH INTRAOPERATIVE CHOLANGIOGRAM;  Surgeon: Nadeen Landau, MD;  Location: [facility] OR[facility];  Service: General;  Laterality: N/A;   COLONOSCOPY WITH PROPOFOL N/A 06/26/2022   Procedure: COLONOSCOPY WITH PROPOFOL;  Surgeon: Wyline Mood,  MD;  Location: [facility] END[facility]COPY;  Service: Gastroenterology;  Laterality: N/A;   CORONARY STENT PLACEMENT     ESOPHAGOGASTRODUODENOSCOPY N/A 06/30/2022   Procedure: ESOPHAGOGASTRODUODENOSCOPY (EGD);  Surgeon: Toney Reil, MD;  Location: [facility] ENDOSCOPY;  Service: Gastroenterology;  Laterality: N/A;   ESOPHAGOGASTRODUODENOSCOPY (EGD) WITH PROPOFOL N/A 06/26/2022   Procedure: ESOPHAGOGASTRODUODENOSCOPY (EGD) WITH PROPOFOL;  Surgeon: Wyline Mood, MD;  Location: [facility]COPY;  Service: Gastroenterology;  Laterality: N/A;   GIVENS CAPSULE STUDY N/A 06/30/2022   Procedure: GIVENS CAPSULE STUDY;  Surgeon: Toney Reil, MD;  Location: [facility] ENDOSCOPY;  Service: Gastroenterology;  Laterality: N/A;  PLACE CAPSULE DURING EGD   JOINT REPLACEMENT     LUMBAR FUSION     TOTAL HIP ARTHROPLASTY     Social History:  reports that she has never smoked. She has never used smokeless tobacco. She reports that she does not drink alcohol and does not use drugs.  Allergies  Allergen Reactions   Ferrous Sulfate Itching   Keflex [Cephalexin] Itching   Percocet [Oxycodone-Acetaminophen] Itching   Vicodin [Hydrocodone-Acetaminophen] Itching   Ace Inhibitors Rash    elevated potassium at higher doses   Penicillins Rash    Has patient had a PCN reaction causing immediate rash, facial/tongue/throat swelling, SOB or lightheadedness with hypotension: No Has patient had a PCN reaction causing severe rash involving mucus membranes or skin necrosis: No Has patient had a PCN reaction that required hospitalization No Has patient had a PCN reaction occurring within the last 10 years: No If all of the above answers are "NO", then may proceed with Cephalosporin use.     Family History  Problem Relation Age of Onset   Diabetes Mother    Hypertension Mother    Heart disease Father    Cancer Maternal Grandfather     Prior to Admission medications   Medication Sig Start Date End Date Taking? Authorizing  Provider  aspirin EC 81 MG tablet Take 81 mg by mouth daily.    [provider]  Cholecalciferol (VITAMIN D3) 2000 units TABS Take 2,000 Units by mouth daily.    [provider]  doxazosin (CARDURA) 2 MG tablet Take 1 tablet (2 mg total) by mouth 2 (two) times daily. 02/04/22   Antonieta Iba, MD  ferrous gluconate (FERGON) 324 MG tablet Take 1 tablet (324 mg total) by mouth daily with breakfast. 07/28/22   Rickard Patience, MD  furosemide (LASIX) 40 MG tablet Take 1 tablet (40 mg total) by mouth daily. 02/04/22   Antonieta Iba, MD  metoprolol succinate (TOPROL-XL) 100 MG 24 hr tablet Take 1 tablet (100 mg total) by mouth daily. 08/09/22   Antonieta Iba, MD  Multiple Vitamins-Minerals (MULTIVITAMIN WITH MINERALS) tablet Take 1 tablet by mouth daily. 07/01/22 07/01/23  Enedina Finner, MD  pantoprazole (PROTONIX) 40 MG tablet Take 1 tablet (40 mg total) by mouth daily. 07/02/22   Enedina Finner, MD  pioglitazone (ACTOS) 30 MG tablet Take 30 mg by mouth daily.  05/07/18   [provider]  rosuvastatin (CRESTOR) 40 MG tablet Take 1 tablet (40 mg total) by mouth daily. 02/04/22   Antonieta Iba, MD  TRULICITY 1.5 MG/0.5ML SOPN INJECT 1.5 MG SUBCUTANEOUSLY  EVERY 7 (SEVEN) DAYS 07/02/18   [provider]    Physical Exam: Vitals:   11/10/22 1626 11/10/22 2045  BP: 119/63 (!) 117/42  Pulse: 75 71  Resp: 18 (!) 22  Temp: 97.7 F (36.5 C)   TempSrc: Oral   SpO2: 97% 98%   Physical Exam Vitals and nursing note reviewed.  Constitutional:      General: She is not in acute distress. HENT:     Head: Normocephalic and atraumatic.  Cardiovascular:     Rate and Rhythm: Normal rate and regular rhythm.     Heart sounds: Normal heart sounds.  Pulmonary:     Effort: Pulmonary effort is normal.     Breath sounds: Normal breath sounds.  Abdominal:     Palpations: Abdomen is soft.     Tenderness: There is no abdominal tenderness.  Neurological:     Mental Status: Mental  status is at baseline.     Labs on Admission: I have personally reviewed following labs and imaging studies  CBC: Recent Labs  Lab 11/10/22 1456 11/10/22 1634  WBC 4.6 4.7  NEUTROABS  --  3.9  HGB 7.7* 8.2*  HCT 26.0* 28.2*  MCV 101.6* 102.5*  PLT 188 210   Basic Metabolic Panel: Recent Labs  Lab 11/10/22 1634  NA 137  K 3.8  CL 100  CO2 26  GLUCOSE 183*  BUN 59*  CREATININE 2.80*  CALCIUM 8.6*   GFR: CrCl cannot be calculated (Unknown ideal weight.). Liver Function Tests: Recent Labs  Lab 11/10/22 1634  AST 30  ALT 13  ALKPHOS 96  BILITOT 0.8  PROT 7.2  ALBUMIN 3.7   No results for input(s): "LIPASE", "AMYLASE" in the last 168 hours. No results for input(s): "AMMONIA" in the last 168 hours. Coagulation Profile: Recent Labs  Lab 11/10/22 1634  INR 1.3*   Cardiac Enzymes: No results for input(s): "CKTOTAL", "CKMB", "CKMBINDEX", "TROPONINI" in the last 168 hours. BNP (last 3 results) No results for input(s): "PROBNP" in the last 8760 hours. HbA1C: No results for input(s): "HGBA1C" in the last 72 hours. CBG: No results for input(s): "GLUCAP" in the last 168 hours. Lipid Profile: No results for input(s): "CHOL", "HDL", "LDLCALC", "TRIG", "CHOLHDL", "LDLDIRECT" in the last 72 hours. Thyroid Function Tests: No results for input(s): "TSH", "T4TOTAL", "FREET4", "T3FREE", "THYROIDAB" in the last 72 hours. Anemia Panel: No results for input(s): "VITAMINB12", "FOLATE", "FERRITIN", "TIBC", "IRON", "RETICCTPCT" in the last 72 hours. Urine analysis:    Component Value Date/Time   COLORURINE YELLOW (A) 02/01/2016 1047   APPEARANCEUR CLEAR (A) 02/01/2016 1047   LABSPEC 1.014 02/01/2016 1047   PHURINE 6.0 02/01/2016 1047   GLUCOSEU >500 (A) 02/01/2016 1047   HGBUR NEGATIVE 02/01/2016 1047   BILIRUBINUR NEGATIVE 02/01/2016 1047   KETONESUR NEGATIVE 02/01/2016 1047   PROTEINUR NEGATIVE 02/01/2016 1047   NITRITE NEGATIVE 02/01/2016 1047   LEUKOCYTESUR  NEGATIVE 02/01/2016 1047    Radiological Exams on Admission: DG Chest 2 View  Result Date: 11/10/2022 CLINICAL DATA:  Weakness EXAM: CHEST - 2 VIEW COMPARISON:  06/25/2022, 07/24/2014 FINDINGS: Cardiomegaly. Aortic atherosclerosis. Probable tiny pleural effusions. Mild diffuse interstitial opacity, no change and possibly due to chronic disease. No consolidation or pneumothorax. IMPRESSION: Cardiomegaly with trace pleural effusions. Electronically Signed   By: Jasmine Pang M.D.   On: 11/10/2022 17:22     Data Reviewed: Relevant notes from primary care and specialist visits, past discharge summaries as available in EHR, including Care Everywhere. Prior diagnostic testing as  pertinent to current admission diagnoses Updated medications and problem lists for reconciliation ED course, including vitals, labs, imaging, treatment and response to treatment Triage notes, nursing and pharmacy notes and ED provider's notes Notable results as noted in HPI   Assessment and Plan: * Acute on chronic blood loss anemia Melena Symptomatic anemia Patient with downtrending hemoglobin despite bimonthly iron infusions, with reports of dyspnea on exertion and fatigue and black stool Negative GI workup in December 2023 (EGD, colonoscopy and capsule endoscopy) Hemoglobin 7.7 in hematology clinic, down from 8.9 a couple weeks prior.  Stool guaiac positive in the ED - Continue Protonix infusion started in the ED - Continue transfusion of 1 unit PRBC - Serial H&H posttransfusion - GI consult -Clear liquid diet and n.p.o. from midnight in case of procedure - SCD for DVT prophylaxis  Hypotension Soft BP of 117/42 in the ED, suspect secondary to suspected GI blood loss Hold home antihypertensives Expecting improvement with transfusion Close hemodynamic monitoring  Acute renal failure superimposed on stage 4 chronic kidney disease (HCC) Creatinine 2.8, up from baseline of 1.37, BUN 59, likely related to  suspected bleeding/prerenal Expecting improvement to baseline with blood transfusion and hydration Continue to monitor  Elevated troponin Troponin slightly elevated but flat 19> 20, suspect supply demand mismatch related to anemia  CAD (coronary artery disease) No complaints of chest pain, EKG nonischemic Continue rosuvastatin.  Will hold aspirin and will also hold metoprolol due to soft blood pressure  Type 2 diabetes mellitus (HCC) Sliding scale insulin coverage    DVT prophylaxis: SCDs  Consults: GI, Dr. Timothy Lasso  Advance Care Planning:   Code Status: Prior   Family Communication: none  Disposition Plan: Back to previous home environment  Severity of Illness: The appropriate patient status for this patient is INPATIENT. Inpatient status is judged to be reasonable and necessary in order to provide the required intensity of service to ensure the patient's safety. The patient's presenting symptoms, physical exam findings, and initial radiographic and laboratory data in the context of their chronic comorbidities is felt to place them at high risk for further clinical deterioration. Furthermore, it is not anticipated that the patient will be medically stable for discharge from the hospital within 2 midnights of admission.   * I certify that at the point of admission it is my clinical judgment that the patient will require inpatient hospital care spanning beyond 2 midnights from the point of admission due to high intensity of service, high risk for further deterioration and high frequency of surveillance required.*  Author: Andris Baumann, MD 11/10/2022 9:47 PM  For on call review [email or web address].

## 2022-11-10 NOTE — ED Provider Notes (Signed)
[facility] Provider Note    Event Date/Time   First MD Initiated Contact with Patient 11/10/22 2033     (approximate)   History   Shortness of Breath   HPI  Melissa Christian is a 82 y.o. female   Past medical history of iron deficient anemia, CAD, CKD, diabetes hypertension hyperlipidemia presents emergency department with anemia.  She was at her iron infusion today when they found her hemoglobin was 7.7 downtrending.  She was also felt extreme fatigue and shortness of breath with exertion.  She is normally ambulatory at home lives by herself uses a walker.  She denies any injuries.  Denies GI bleeding but her stool has been black ever since taking her iron supplementations, unchanged.    Independent Historian contributed to assessment above: Her son is at bedside corroborates information obtained above  External Medical Documents Reviewed: I reviewed hematology/oncology note from April 2024 documenting her iron deficient anemia and recent upper and lower endoscopies with pill endoscopy showing no signs of GI bleeding.      Physical Exam   Triage Vital Signs: ED Triage Vitals  Enc Vitals Group     BP 11/10/22 1626 119/63     Pulse Rate 11/10/22 1626 75     Resp 11/10/22 1626 18     Temp 11/10/22 1626 97.7 F (36.5 C)     Temp Source 11/10/22 1626 Oral     SpO2 11/10/22 1626 97 %     Weight --      Height --      Head Circumference --      Peak Flow --      Pain Score 11/10/22 1627 0     Pain Loc --      Pain Edu? --      Excl. in GC? --     Most recent vital signs: Vitals:   11/10/22 1626 11/10/22 2045  BP: 119/63 (!) 117/42  Pulse: 75 71  Resp: 18 (!) 22  Temp: 97.7 F (36.5 C)   SpO2: 97% 98%    General: Awake, no distress.  CV:  Good peripheral perfusion.  Resp:  Normal effort.  Abd:  No distention.  Other:  Awake alert normal hemodynamics, slightly low blood pressure 117/42 rectal exam with formed black stool that is guaiac  positive.  Abdomen soft and nontender.  Awake alert oriented.   ED Results / Procedures / Treatments   Labs (all labs ordered are listed, but only abnormal results are displayed) Labs Reviewed  COMPREHENSIVE METABOLIC PANEL - Abnormal; Notable for the following components:      Result Value   Glucose, Bld 183 (*)    BUN 59 (*)    Creatinine, Ser 2.80 (*)    Calcium 8.6 (*)    GFR, Estimated 16 (*)    All other components within normal limits  CBC WITH DIFFERENTIAL/PLATELET - Abnormal; Notable for the following components:   RBC 2.75 (*)    Hemoglobin 8.2 (*)    HCT 28.2 (*)    MCV 102.5 (*)    MCHC 29.1 (*)    RDW 16.3 (*)    Lymphs Abs 0.4 (*)    All other components within normal limits  PROTIME-INR - Abnormal; Notable for the following components:   Prothrombin Time 16.6 (*)    INR 1.3 (*)    All other components within normal limits  TROPONIN I (HIGH SENSITIVITY) - Abnormal; Notable for the following components:  Troponin I (High Sensitivity) 19 (*)    All other components within normal limits  URINALYSIS, ROUTINE W REFLEX MICROSCOPIC  TYPE AND SCREEN  PREPARE RBC (CROSSMATCH)  TROPONIN I (HIGH SENSITIVITY)     I ordered and reviewed the above labs they are notable for hemoglobin 7.7 on recheck 8.2.  Her creatinine is evaded from prior at 2.8.  EKG  ED ECG REPORT I, Pilar Jarvis, the attending physician, personally viewed and interpreted this ECG.   Date: 11/10/2022  EKG Time: 1627  Rate: 70  Rhythm: sinus  Axis: nl  Intervals:1st degree av block, nonspecific intraventricular conduction delay  ST&T Change: no stemi    RADIOLOGY I independently reviewed and interpreted chest x-ray and see no obvious focality pneumothorax   PROCEDURES:  Critical Care performed: Yes, see critical care procedure note(s)  .Critical Care  Performed by: Pilar Jarvis, MD Authorized by: Pilar Jarvis, MD   Critical care provider statement:    Critical care time (minutes):   30   Critical care was time spent personally by me on the following activities:  Development of treatment plan with patient or surrogate, discussions with consultants, evaluation of patient's response to treatment, examination of patient, ordering and review of laboratory studies, ordering and review of radiographic studies, ordering and performing treatments and interventions, pulse oximetry, re-evaluation of patient's condition and review of old charts    MEDICATIONS ORDERED IN ED: Medications  pantoprazole (PROTONIX) 80 mg /NS 100 mL IVPB (has no administration in time range)  0.9 %  sodium chloride infusion (has no administration in time range)    External physician / consultants:  I spoke with hospitalist for admission and regarding care plan for this patient.   IMPRESSION / MDM / ASSESSMENT AND PLAN / ED COURSE  I reviewed the triage vital signs and the nursing notes.                                Patient's presentation is most consistent with acute presentation with potential threat to life or bodily function.  Differential diagnosis includes, but is not limited to, acute blood loss anemia, iron deficient anemia, AKI, electrolyte derangement, dehydration, respiratory infection, ACS, PE   The patient is on the cardiac monitor to evaluate for evidence of arrhythmia and/or significant heart rate changes.  MDM: This is a patient with anemia now below 8 known iron deficient anemia with recent GI workup extensively with no evidence of GI bleeding.  She does have guaiac positive stool today and it acute drop in her hemoglobin now symptomatic anemia.  Since she is below 8 with a history of CAD I will transfuse her 1 unit.  I will start Protonix for suspected upper GI bleeding with guaiac positive stool and acute worsening of anemia along with AKI in setting of suspected acute GI blood loss.  Admission.         FINAL CLINICAL IMPRESSION(S) / ED DIAGNOSES   Final diagnoses:   Symptomatic anemia  UGIB (upper gastrointestinal bleed)     Rx / DC Orders   ED Discharge Orders     None        Note:  This document was prepared using Dragon voice recognition software and may include unintentional dictation errors.    Pilar Jarvis, MD 11/10/22 2111

## 2022-11-10 NOTE — Assessment & Plan Note (Addendum)
Troponin slightly elevated but flat 19> 20, suspect supply demand mismatch related to anemia

## 2022-11-10 NOTE — ED Triage Notes (Signed)
Patient to ED via POV from the cancer center. Patient was receiving iron infusion today and had routine labs showing her hemoglobin went from 8.6 to 7.7. Patient was feeling SOB and placed on 2L at cancer center.

## 2022-11-10 NOTE — Assessment & Plan Note (Addendum)
Melena Symptomatic anemia Patient with downtrending hemoglobin despite bimonthly iron infusions, with reports of dyspnea on exertion and fatigue and black stool Negative GI workup in December 2023 (EGD, colonoscopy and capsule endoscopy) Hemoglobin 7.7 in hematology clinic, down from 8.9 a couple weeks prior.  Stool guaiac positive in the ED - Continue Protonix infusion started in the ED - Continue transfusion of 1 unit PRBC - Serial H&H posttransfusion - GI consult -Clear liquid diet and n.p.o. from midnight in case of procedure - SCD for DVT prophylaxis

## 2022-11-10 NOTE — Assessment & Plan Note (Signed)
Soft BP of 117/42 in the ED, suspect secondary to suspected GI blood loss Hold home antihypertensives Expecting improvement with transfusion Close hemodynamic monitoring

## 2022-11-10 NOTE — ED Provider Triage Note (Signed)
Emergency Medicine Provider Triage Evaluation Note  Melissa Christian , a 82 y.o. female  was evaluated in triage.  Pt complains of weakness/dizziness and anemia. Patient at cancer center today receiving iron infusion. Felt weak, dizzy. Labs drawn and Hgb down to 7.7 from 8.6. Dark stools reported at baseline from iron supplements, unchanged from normal. No visualized rectal bleeding.  No other complaints..  Review of Systems  Positive: Weakness, dizziness, drop in hemoglobin Negative: Headache chest pain, shortness of breath no abdominal pain, reported GI bleeding  Physical Exam  There were no vitals taken for this visit. Gen:   Awake, no distress  Resp:  Normal effort  MSK:   Moves extremities without difficulty  Other:    Medical Decision Making  Medically screening exam initiated at 4:26 PM.  Appropriate orders placed.  Melissa Christian was informed that the remainder of the evaluation will be completed by another provider, this initial triage assessment does not replace that evaluation, and the importance of remaining in the ED until their evaluation is complete.  Labs, type and screen, EKG, urine   Racheal Patches, PA-C 11/10/22 1631

## 2022-11-11 DIAGNOSIS — D62 Acute posthemorrhagic anemia: Secondary | ICD-10-CM | POA: Diagnosis not present

## 2022-11-11 LAB — CBG MONITORING, ED
Glucose-Capillary: 100 mg/dL — ABNORMAL HIGH (ref 70–99)
Glucose-Capillary: 119 mg/dL — ABNORMAL HIGH (ref 70–99)
Glucose-Capillary: 119 mg/dL — ABNORMAL HIGH (ref 70–99)
Glucose-Capillary: 128 mg/dL — ABNORMAL HIGH (ref 70–99)
Glucose-Capillary: 134 mg/dL — ABNORMAL HIGH (ref 70–99)
Glucose-Capillary: 83 mg/dL (ref 70–99)

## 2022-11-11 LAB — TSH: TSH: 4.93 u[IU]/mL — ABNORMAL HIGH (ref 0.350–4.500)

## 2022-11-11 LAB — URINALYSIS, ROUTINE W REFLEX MICROSCOPIC
Bacteria, UA: NONE SEEN
Bilirubin Urine: NEGATIVE
Glucose, UA: NEGATIVE mg/dL
Ketones, ur: NEGATIVE mg/dL
Nitrite: NEGATIVE
Protein, ur: 100 mg/dL — AB
Specific Gravity, Urine: 1.017 (ref 1.005–1.030)
pH: 6 (ref 5.0–8.0)

## 2022-11-11 LAB — CBC
HCT: 28.5 % — ABNORMAL LOW (ref 36.0–46.0)
Hemoglobin: 8.5 g/dL — ABNORMAL LOW (ref 12.0–15.0)
MCH: 29.6 pg (ref 26.0–34.0)
MCHC: 29.8 g/dL — ABNORMAL LOW (ref 30.0–36.0)
MCV: 99.3 fL (ref 80.0–100.0)
Platelets: 195 10*3/uL (ref 150–400)
RBC: 2.87 MIL/uL — ABNORMAL LOW (ref 3.87–5.11)
RDW: 17.8 % — ABNORMAL HIGH (ref 11.5–15.5)
WBC: 4.8 10*3/uL (ref 4.0–10.5)
nRBC: 0 % (ref 0.0–0.2)

## 2022-11-11 LAB — TYPE AND SCREEN: Unit division: 0

## 2022-11-11 LAB — CK: Total CK: 86 U/L (ref 38–234)

## 2022-11-11 LAB — BPAM RBC
ISSUE DATE / TIME: 202404282328
Unit Type and Rh: 5100
Unit Type and Rh: 6200

## 2022-11-11 LAB — BASIC METABOLIC PANEL
Anion gap: 7 (ref 5–15)
BUN: 53 mg/dL — ABNORMAL HIGH (ref 8–23)
CO2: 26 mmol/L (ref 22–32)
Calcium: 8.3 mg/dL — ABNORMAL LOW (ref 8.9–10.3)
Chloride: 106 mmol/L (ref 98–111)
Creatinine, Ser: 2.57 mg/dL — ABNORMAL HIGH (ref 0.44–1.00)
GFR, Estimated: 18 mL/min — ABNORMAL LOW (ref >60)
Glucose, Bld: 117 mg/dL — ABNORMAL HIGH (ref 70–99)
Potassium: 3.9 mmol/L (ref 3.5–5.1)
Sodium: 139 mmol/L (ref 135–145)

## 2022-11-11 LAB — T4, FREE: Free T4: 1.1 ng/dL (ref 0.61–1.12)

## 2022-11-11 MED ORDER — FUROSEMIDE 10 MG/ML IJ SOLN
4.0000 mg/h | INTRAVENOUS | Status: DC
  Administered 2022-11-11 – 2022-11-13 (×2): 4 mg/h via INTRAVENOUS
  Filled 2022-11-11 (×2): qty 20

## 2022-11-11 MED ORDER — EPOETIN ALFA 40000 UNIT/ML IJ SOLN
20000.0000 [IU] | INTRAMUSCULAR | Status: DC
  Administered 2022-11-11: 20000 [IU] via SUBCUTANEOUS
  Filled 2022-11-11: qty 1

## 2022-11-11 MED ORDER — DOXAZOSIN MESYLATE 2 MG PO TABS
2.0000 mg | ORAL_TABLET | Freq: Two times a day (BID) | ORAL | Status: DC
  Administered 2022-11-11: 2 mg via ORAL
  Filled 2022-11-11: qty 1

## 2022-11-11 MED ORDER — VITAMIN D 25 MCG (1000 UNIT) PO TABS
2000.0000 [IU] | ORAL_TABLET | Freq: Every day | ORAL | Status: DC
  Administered 2022-11-11 – 2022-11-15 (×5): 2000 [IU] via ORAL
  Filled 2022-11-11 (×5): qty 2

## 2022-11-11 MED ORDER — MIDODRINE HCL 5 MG PO TABS
5.0000 mg | ORAL_TABLET | Freq: Three times a day (TID) | ORAL | Status: DC
  Administered 2022-11-11: 5 mg via ORAL
  Filled 2022-11-11 (×2): qty 1

## 2022-11-11 MED ORDER — FUROSEMIDE 40 MG PO TABS
40.0000 mg | ORAL_TABLET | Freq: Every day | ORAL | Status: DC
  Administered 2022-11-11: 40 mg via ORAL
  Filled 2022-11-11: qty 1

## 2022-11-11 MED ORDER — IPRATROPIUM-ALBUTEROL 0.5-2.5 (3) MG/3ML IN SOLN: 3.0000 mL | Freq: Four times a day (QID) | RESPIRATORY_TRACT | Status: DC | PRN

## 2022-11-11 NOTE — ED Notes (Signed)
Pt resting in bed, family at bedside and updated on pt care plan. Denies other needs at this time

## 2022-11-11 NOTE — Progress Notes (Signed)
[facility] Progress Note  Patient: Melissa Christian    NWG:[id number]  DOA: 11/10/2022     Date of Service: the patient was seen and examined on 11/11/2022  Chief Complaint  Patient presents with   Shortness of Breath   Brief hospital course:  HPI: Melissa Christian is a 82 y.o. female with medical history significant for morbid obesity, CAD, diabetes mellitus, stage IV CKD and hypertension, with chronic iron deficiency anemia due to suspected occult chronic GI blood loss but with negative extensive GI workup  in December 2023 during a hospitalization for acute blood loss anemia requiring 2 units PRBCs for hemoglobin of 6.7 (negative EGD, colonoscopy and capsule endoscopy), and who is currently on outpatient iron transfusions, who was sent from the hematology office for admission for symptomatic anemia.  Patient reportedly had an unexpected drop in her hemoglobin to 7.7 from 8.9 two weeks prior and additionally was symptomatic for dyspnea on exertion and weakness.  She reported having black stool, similar to when she was hospitalized back in December.  She denies chest pain.She received an iron transfusion prior to being sent to the ED.   ED course and data review: Vitals initially within normal limits though blood pressure was a bit soft at the time of admission at 117/42.  Hemoglobin 8.2, up from 7.7 earlier in the day at the hematology clinic.  Troponin 20.  Creatinine 2.8, up from her baseline of 1.37 and BUN 59. Stool guaiac positive. EKG, personally viewed and interpreted showing sinus at 70 with nonspecific ST-T wave changes. Chest x-ray showing cardiomegaly with trace pleural effusions. Patient was started on a Protonix infusion and a unit of PRBCs was ordered. Hospitalist consulted for admission.   Assessment and Plan:  Symptomatic anemia secondary to iron deficiency and CKD Transferrin saturation 10% on 10/26/2022, patient received Venofer IV infusion on 11/10/2022.  Patient is  following hematology as an outpatient. 5/9 1 unit PRBC transfusion given.  Posttransfusion Hb 8.5 Trend H&H and transfuse if hemoglobin less than 7 GI consulted, recommended to follow-up with hematologist, no intervention at this time. As per GI anemia less likely due to GI bleed, continue PPI. Nephrology consulted for possible Epogen due to CKD, could be causing anemia   Anasarca, patient has significant bilateral lower extremity edema Discontinued oral Lasix Started Lasix IV infusion Monitor renal functions and daily body weight Follow nephrology for further recommendation   AKI on CKD stage IIIb/IV Baseline creatinine 1.37-1.9, eGFR 39--25 Creatinine 2.8 on admission Creatinine 2.57, continue to monitor renal functions Avoid nephrotoxic medications Nephrology consulted   CAD, HTN, HLD Mildly elevated troponin most likely due to demand ischemia, patient denied any chest pain Blood pressure is soft, continue to monitor Continue Crestor 40 mg p.o. daily home dose Held aspirin due to symptomatic anemia Held metoprolol, triamterene-hydrochlorothiazide, and Cardura due to hypotension 5/10 started midodrine 5 mg p.o. 3 times daily with holding parameters   IDDM T2 Patient is on Trulicity and Actos at home which has been held for now [employer] sliding scale, monitor CBG, continue diabetic diet   Abnormal TSH, TSH level 4.9 slightly elevated, free T4 within normal range 1.1 Follow with PCP to repeat TFTs after 4 to 6 weeks.   There is no height or weight on file to calculate BMI.  Interventions:     Diet: Diabetic diet DVT Prophylaxis: SCD, pharmacological prophylaxis contraindicated due to symptomatic anemia    Advance goals of care discussion: Full code  Family Communication: family  was not present at bedside, at the time of interview.  The pt provided permission to discuss medical plan with the family. Opportunity was given to ask question and all questions were  answered satisfactorily.   Disposition:  Pt is from Home, admitted with symptomatic anemia, found to have anasarca, lower extremity edema, started Lasix IV infusion, nephrology consulted, which precludes a safe discharge. Discharge to home with [facility] vs SNF TBD after PT/OT eval, when clinically stable.  Subjective: No significant events overnight, patient feels improvement in the shortness of breath, complaining of significant edema in the lower extremities, no chest pain or palpitations, no any other active issues.  Physical Exam: General: NAD, lying comfortably Appear in no distress, affect appropriate Eyes: PERRLA ENT: Oral Mucosa Clear, moist  Neck: no JVD,  Cardiovascular: S1 and S2 Present, no Murmur,  Respiratory: good respiratory effort, Bilateral Air entry equal and Decreased, no Crackles, occasional wheezes Abdomen: Bowel Sound present, Soft, obese and no tenderness, mild edema of abdominal wall Skin: no rashes Extremities: + Pedal edema, no calf tenderness Neurologic: without any new focal findings Gait not checked due to patient safety concerns  Vitals:   11/11/22 0630 11/11/22 0700 11/11/22 0949 11/11/22 1102  BP: (!) 124/51 (!) 117/44 (!) 124/48 (!) 106/37  Pulse: 68 60  70  Resp: 17 18  20   Temp:    (!) 97.5 F (36.4 C)  TempSrc:    Oral  SpO2: 100% 100%  99%    Intake/Output Summary (Last 24 hours) at 11/11/2022 1344 Last data filed at 11/11/2022 1058 Gross per 24 hour  Intake 672.7 ml  Output 300 ml  Net 372.7 ml   There were no vitals filed for this visit.  Data Reviewed: I have personally reviewed and interpreted daily labs, tele strips, imagings as discussed above. I reviewed all nursing notes, pharmacy notes, vitals, pertinent old records I have discussed plan of care as described above with RN and patient/family.  CBC: Recent Labs  Lab 11/10/22 1456 11/10/22 1634 11/11/22 0500  WBC 4.6 4.7 4.8  NEUTROABS  --  3.9  --   HGB 7.7* 8.2* 8.5*  HCT  26.0* 28.2* 28.5*  MCV 101.6* 102.5* 99.3  PLT 188 210 195   Basic Metabolic Panel: Recent Labs  Lab 11/10/22 1634 11/11/22 0500  NA 137 139  K 3.8 3.9  CL 100 106  CO2 26 26  GLUCOSE 183* 117*  BUN 59* 53*  CREATININE 2.80* 2.57*  CALCIUM 8.6* 8.3*    Studies: DG Chest 2 View  Result Date: 11/10/2022 CLINICAL DATA:  Weakness EXAM: CHEST - 2 VIEW COMPARISON:  06/25/2022, 07/24/2014 FINDINGS: Cardiomegaly. Aortic atherosclerosis. Probable tiny pleural effusions. Mild diffuse interstitial opacity, no change and possibly due to chronic disease. No consolidation or pneumothorax. IMPRESSION: Cardiomegaly with trace pleural effusions. Electronically Signed   By: Jasmine Pang M.D.   On: 11/10/2022 17:22    Scheduled Meds:  cholecalciferol  2,000 Units Oral Daily   doxazosin  2 mg Oral BID   insulin aspart  0-20 Units Subcutaneous Q4H   rosuvastatin  40 mg Oral Daily   Continuous Infusions:  furosemide (LASIX) 200 mg in dextrose 5 % 100 mL (2 mg/mL) infusion     PRN Meds: acetaminophen **OR** acetaminophen, ondansetron **OR** ondansetron (ZOFRAN) IV  Time spent: 55 minutes  Author: Gillis Santa. MD [facility] 11/11/2022 1:44 PM  To reach On-call, see care teams to locate the attending and reach out to them via [email or web address]. If  7PM-7AM, please contact night-coverage If you still have difficulty reaching the attending provider, please page the [facility] (Director on Call) for [facility] on amion for assistance.

## 2022-11-11 NOTE — Consult Note (Signed)
[facility]  CONSULT NOTE    Date: 11/11/2022                  Patient Name:  Melissa Christian  MRN: [id number]  DOB: October 26, 1940  Age / Sex: 83 y.o., female         PCP: Danella Penton, MD                 Service Requesting Consult: [facility]                 Reason for Consult: Acute kidney injury            History of Present Illness: Melissa Christian is a 82 y.o.  female with past medical conditions including diabetes, obesity, CAD, hypertension, and chronic kidney disease., who was admitted to [facility] on 11/10/2022 for ABLA (acute blood loss anemia) [D62]  Patient is known to our practice and was seen by Dr. Cherylann Ratel until lost to follow-up.  Patient states she became short of breath at home prior to presenting to the emergency department.  Patient denies nausea, vomiting, or diarrhea.  Denies known fever or chills.  Patient currently resting on stretcher, 2 L nasal cannula.  Lower extremity edema noted.  Labs on ED arrival significant for sodium 137, glucose 183, BUN 59, creatinine 2.8 with GFR 16, troponin 19, hemoglobin 8.2.  UA appears clear with some proteinuria and leukocytes.  Chest x-ray shows cardiomegaly with trace pleural effusions.    Medications: Outpatient medications: (Not in a hospital admission)   Current medications: Current Facility-Administered Medications  Medication Dose Route Frequency Provider Last Rate Last Admin   acetaminophen (TYLENOL) tablet 650 mg  650 mg Oral Q6H PRN Andris Baumann, MD       Or   acetaminophen (TYLENOL) suppository 650 mg  650 mg Rectal Q6H PRN Andris Baumann, MD       cholecalciferol (VITAMIN D3) 25 MCG (1000 UNIT) tablet 2,000 Units  2,000 Units Oral Daily Lindajo Royal V, MD   2,000 Units at 11/11/22 0950   furosemide (LASIX) 200 mg in dextrose 5 % 100 mL (2 mg/mL) infusion  4 mg/hr Intravenous Continuous Gillis Santa, MD       insulin aspart (novoLOG) injection 0-20 Units  0-20 Units Subcutaneous Q4H Andris Baumann, MD       ondansetron [facility]) tablet 4 mg  4 mg Oral Q6H PRN Andris Baumann, MD       Or   ondansetron [facility]) injection 4 mg  4 mg Intravenous Q6H PRN Andris Baumann, MD       rosuvastatin (CRESTOR) tablet 40 mg  40 mg Oral Daily Lindajo Royal V, MD   40 mg at 11/11/22 8295   Current Outpatient Medications  Medication Sig Dispense Refill   aspirin EC 81 MG tablet Take 81 mg by mouth daily.     Cholecalciferol (VITAMIN D3) 2000 units TABS Take 2,000 Units by mouth daily.     doxazosin (CARDURA) 2 MG tablet Take 1 tablet (2 mg total) by mouth 2 (two) times daily. 180 tablet 3   ferrous gluconate (FERGON) 324 MG tablet Take 1 tablet (324 mg total) by mouth daily with breakfast. 60 tablet 3   furosemide (LASIX) 40 MG tablet Take 1 tablet (40 mg total) by mouth daily. 90 tablet 3   metoprolol succinate (TOPROL-XL) 100 MG 24 hr tablet Take 1 tablet (100 mg total) by mouth daily. 90 tablet  1   Multiple Vitamins-Minerals (MULTIVITAMIN WITH MINERALS) tablet Take 1 tablet by mouth daily. 120 tablet 2   pantoprazole (PROTONIX) 40 MG tablet Take 1 tablet (40 mg total) by mouth daily. 30 tablet 1   pioglitazone (ACTOS) 30 MG tablet Take 30 mg by mouth daily.      rosuvastatin (CRESTOR) 40 MG tablet Take 1 tablet (40 mg total) by mouth daily. 90 tablet 3   triamterene-hydrochlorothiazide (DYAZIDE) 37.5-25 MG capsule Take 1 capsule by mouth every morning.     TRULICITY 1.5 MG/0.5ML SOPN INJECT 1.5 MG SUBCUTANEOUSLY EVERY 7 (SEVEN) DAYS        Allergies: Allergies  Allergen Reactions   Ferrous Sulfate Itching   Keflex [Cephalexin] Itching   Percocet [Oxycodone-Acetaminophen] Itching   Vicodin [Hydrocodone-Acetaminophen] Itching   Ace Inhibitors Rash    elevated potassium at higher doses   Penicillins Rash    Has patient had a PCN reaction causing immediate rash, facial/tongue/throat swelling, SOB or lightheadedness with hypotension: No Has patient had a PCN reaction causing severe rash  involving mucus membranes or skin necrosis: No Has patient had a PCN reaction that required hospitalization No Has patient had a PCN reaction occurring within the last 10 years: No If all of the above answers are "NO", then may proceed with Cephalosporin use.       Past Medical History: Past Medical History:  Diagnosis Date   Anemia    CAD (coronary artery disease)    a. NSTEMI 09/2015: cardiac cath: LM no obs dz, mLAD 95% s/p PCI/DES 0%, LCx no obs, RCA no obs, LVEF 55-65%   Chronic kidney disease (CKD), stage III (moderate) (HCC)    Diabetes mellitus with complication (HCC)    HLD (hyperlipidemia)    Hypertension    Low blood potassium    NSTEMI (non-ST elevated myocardial infarction) (HCC)    PONV (postoperative nausea and vomiting)    Valvular heart disease    a. 09/2015: EF 55-60%, challenging images unable to exclude mild mid-distal anterior to apical HK, nl LV dias fxn, mild AI/MR, normal size of left atrium, RV systolic function normal, PASP normal       Past Surgical History: Past Surgical History:  Procedure Laterality Date   BACK SURGERY     CARDIAC CATHETERIZATION N/A 09/23/2015   Procedure: Right and Left Heart Cath;  Surgeon: Antonieta Iba, MD;  Location: [facility] INVASIVE CV LAB;  Service: Cardiovascular;  Laterality: N/A;   CARDIAC CATHETERIZATION N/A 09/23/2015   Procedure: Coronary Stent Intervention;  Surgeon: Alwyn Pea, MD;  Location: [facility] INVASIVE CV LAB;  Service: Cardiovascular;  Laterality: N/A;   CATARACT EXTRACTION W/PHACO Left 04/26/2017   Procedure: CATARACT EXTRACTION PHACO AND INTRAOCULAR LENS PLACEMENT (IOC) LEFT DIABETIC;  Surgeon: Lockie Mola, MD;  Location: [facility];  Service: Ophthalmology;  Laterality: Left;  diabetic-oral meds   CATARACT EXTRACTION W/PHACO Right 05/31/2017   Procedure: CATARACT EXTRACTION PHACO AND INTRAOCULAR LENS PLACEMENT (IOC) RIGHT DIABETIC;  Surgeon: Lockie Mola, MD;  Location: [facility]  SURGERY [facility];  Service: Ophthalmology;  Laterality: Right;   CHOLECYSTECTOMY N/A 06/16/2016   Procedure: LAPAROSCOPIC CHOLECYSTECTOMY WITH INTRAOPERATIVE CHOLANGIOGRAM;  Surgeon: Nadeen Landau, MD;  Location: [facility] ORS;  Service: General;  Laterality: N/A;   COLONOSCOPY WITH PROPOFOL N/A 06/26/2022   Procedure: COLONOSCOPY WITH PROPOFOL;  Surgeon: Wyline Mood, MD;  Location: [facility] ENDOSCOPY;  Service: Gastroenterology;  Laterality: N/A;   CORONARY STENT PLACEMENT     ESOPHAGOGASTRODUODENOSCOPY N/A 06/30/2022   Procedure: ESOPHAGOGASTRODUODENOSCOPY (EGD);  Surgeon: Toney Reil, MD;  Location: [facility] ENDOSCOPY;  Service: Gastroenterology;  Laterality: N/A;   ESOPHAGOGASTRODUODENOSCOPY (EGD) WITH PROPOFOL N/A 06/26/2022   Procedure: ESOPHAGOGASTRODUODENOSCOPY (EGD) WITH PROPOFOL;  Surgeon: Wyline Mood, MD;  Location: [facility] ENDOSCOPY;  Service: Gastroenterology;  Laterality: N/A;   GIVENS CAPSULE STUDY N/A 06/30/2022   Procedure: GIVENS CAPSULE STUDY;  Surgeon: Toney Reil, MD;  Location: [facility] ENDOSCOPY;  Service: Gastroenterology;  Laterality: N/A;  PLACE CAPSULE DURING EGD   JOINT REPLACEMENT     LUMBAR FUSION     TOTAL HIP ARTHROPLASTY       Family History: Family History  Problem Relation Age of Onset   Diabetes Mother    Hypertension Mother    Heart disease Father    Cancer Maternal Grandfather      Social History: Social History   Socioeconomic History   Marital status: Widowed    Spouse name: Not on file   Number of children: Not on file   Years of education: Not on file   Highest education level: Not on file  Occupational History   Not on file  Tobacco Use   Smoking status: Never   Smokeless tobacco: Never  Vaping Use   Vaping Use: Never used  Substance and Sexual Activity   Alcohol use: No    Alcohol/week: 0.0 standard drinks of alcohol   Drug use: No   Sexual activity: Not Currently    Birth control/protection: None, Post-menopausal  Other  Topics Concern   Not on file  Social History Narrative   Not on file   Social Determinants of Health   Financial Resource Strain: Not on file  Food Insecurity: No Food Insecurity (06/26/2022)   Hunger Vital Sign    Worried About Running Out of Food in the Last Year: Never true    Ran Out of Food in the Last Year: Never true  Transportation Needs: No Transportation Needs (06/26/2022)   PRAPARE - Administrator, Civil Service (Medical): No    Lack of Transportation (Non-Medical): No  Physical Activity: Not on file  Stress: Not on file  Social Connections: Not on file  Intimate Partner Violence: Not At Risk (06/26/2022)   Humiliation, Afraid, Rape, and Kick questionnaire    Fear of Current or Ex-Partner: No    Emotionally Abused: No    Physically Abused: No    Sexually Abused: No     Review of Systems: Review of Systems  Constitutional:  Negative for chills, fever and malaise/fatigue.  HENT:  Negative for congestion, sore throat and tinnitus.   Eyes:  Negative for blurred vision and redness.  Respiratory:  Positive for shortness of breath. Negative for cough and wheezing.   Cardiovascular:  Negative for chest pain, palpitations, claudication and leg swelling.  Gastrointestinal:  Negative for abdominal pain, blood in stool, diarrhea, nausea and vomiting.  Genitourinary:  Negative for flank pain, frequency and hematuria.  Musculoskeletal:  Negative for back pain, falls and myalgias.  Skin:  Negative for rash.  Neurological:  Negative for dizziness, weakness and headaches.  Endo/Heme/Allergies:  Does not bruise/bleed easily.  Psychiatric/Behavioral:  Negative for depression. The patient is not nervous/anxious and does not have insomnia.     Vital Signs: Blood pressure (!) 106/37, pulse 70, temperature (!) 97.5 F (36.4 C), temperature source Oral, resp. rate 20, SpO2 99 %.  Weight trends: There were no vitals filed for this visit.  Physical Exam: General: NAD   Head: Normocephalic, atraumatic. Moist oral mucosal membranes  Eyes: Anicteric  Lungs:  Clear to auscultation  Heart: Regular rate and rhythm  Abdomen:  Soft, nontender, obese  Extremities:  2+ peripheral edema.  Neurologic: Alert, moving all four extremities  Skin: No lesions  Access: None     Lab results: Basic Metabolic Panel: Recent Labs  Lab 11/10/22 1634 11/11/22 0500  NA 137 139  K 3.8 3.9  CL 100 106  CO2 26 26  GLUCOSE 183* 117*  BUN 59* 53*  CREATININE 2.80* 2.57*  CALCIUM 8.6* 8.3*    Liver Function Tests: Recent Labs  Lab 11/10/22 1634  AST 30  ALT 13  ALKPHOS 96  BILITOT 0.8  PROT 7.2  ALBUMIN 3.7   No results for input(s): "LIPASE", "AMYLASE" in the last 168 hours. No results for input(s): "AMMONIA" in the last 168 hours.  CBC: Recent Labs  Lab 11/10/22 1456 11/10/22 1634 11/11/22 0500  WBC 4.6 4.7 4.8  NEUTROABS  --  3.9  --   HGB 7.7* 8.2* 8.5*  HCT 26.0* 28.2* 28.5*  MCV 101.6* 102.5* 99.3  PLT 188 210 195    Cardiac Enzymes: No results for input(s): "CKTOTAL", "CKMB", "CKMBINDEX", "TROPONINI" in the last 168 hours.  BNP: Invalid input(s): "POCBNP"  CBG: Recent Labs  Lab 11/11/22 0013 11/11/22 0459 11/11/22 0747 11/11/22 1117  GLUCAP 134* 119* 83 100*    Microbiology: No results found for this or any previous visit.  Coagulation Studies: Recent Labs    11/10/22 1634  LABPROT 16.6*  INR 1.3*    Urinalysis: Recent Labs    11/10/22 1627  COLORURINE YELLOW*  LABSPEC 1.017  PHURINE 6.0  GLUCOSEU NEGATIVE  HGBUR SMALL*  BILIRUBINUR NEGATIVE  KETONESUR NEGATIVE  PROTEINUR 100*  NITRITE NEGATIVE  LEUKOCYTESUR SMALL*      Imaging: DG Chest 2 View  Result Date: 11/10/2022 CLINICAL DATA:  Weakness EXAM: CHEST - 2 VIEW COMPARISON:  06/25/2022, 07/24/2014 FINDINGS: Cardiomegaly. Aortic atherosclerosis. Probable tiny pleural effusions. Mild diffuse interstitial opacity, no change and possibly due to chronic  disease. No consolidation or pneumothorax. IMPRESSION: Cardiomegaly with trace pleural effusions. Electronically Signed   By: Jasmine Pang M.D.   On: 11/10/2022 17:22     Assessment & Plan: Melissa Christian is a 82 y.o.  female with past medical conditions including diabetes, obesity, CAD, hypertension, and chronic kidney disease., who was admitted to [facility] on 11/10/2022 for ABLA (acute blood loss anemia) [D62]  Acute kidney injury on chronic kidney disease stage IV. Baseline creatinine appears to be 1.6 with GFR 32 on 09/01/2022.  It is suspected that acute kidney injury is likely secondary to fluid overload.  Primary team has ordered diuresis.  Will continue to monitor renal function and assess effectiveness of diuresis.  2. Anemia of chronic kidney disease Lab Results  Component Value Date   HGB 8.5 (L) 11/11/2022    Hemoglobin below desired target.  Will continue to monitor for now.  3.  Hypertension with chronic kidney disease.  Home regimen includes metoprolol, Hydrochlorothiazide, furosemide, doxazosin.  All currently held.  4. Diabetes mellitus type II with chronic kidney disease/renal manifestations: noninsulin dependent. Home regimen includes Trulicity. Most recent hemoglobin A1c is 5.8 on 09/01/22.     LOS: 1 Shamonique Battiste 5/10/20242:02 PM

## 2022-11-11 NOTE — ED Notes (Signed)
Pt is NPO but was given a Ginger Ale. Paged MD for order clarification.

## 2022-11-11 NOTE — ED Notes (Signed)
Blood bank called stating that they need a new type and screen in case patient has further blood transfusion orders.

## 2022-11-11 NOTE — Consult Note (Signed)
Consultation  Referring Provider:    Dr Para March  Admit date 11/10/22 Consult date        11/11/22 Reason for Consultation:     anemia exacerbation         HPI:   Melissa Christian is a 82 y.o. female  with medical history significant for morbid obesity, CAD, diabetes mellitus, stage IV CKD and hypertension, with chronic iron deficiency anemia - Had GI eval for IDA 12/23 during a hospitalization for acute blood loss anemia requiring 2 units PRBCs for hemoglobin of 6.7 (negative EGD, colonoscopy and capsule endoscopy) as below Presented to ED from hematology office for symptomatic anemia/black stools. She also received an iron infusion yesterday. Hgb was 7.7 and she has received 1u prbc. She was guaiac + in ED. Note her ferritin was normal 10/26/22 as was her serum iron and TIBC. She also received some IV iron yesterday at hematology clinic. Patient's sister is with her and helps with history due to patient's limited recall. Patient states she has been doing well in her abdomen. Describes one dark stool/d- not sticky or loose but very dark like black. Feels like this is because of her iron therapy. She is unsure when this color started as earlier this year she was having brown stools. Denies abdominal pain, dyspepsia, NVD and all other GI concerns.  She feels she is taking the pantoprazole 40mg  daily that is on her med list. She has had several GI tests as noted below. States this only nsaid she takes is her 81mg  ASA. Her main concern is fatigue/weakness and intermittent sob. There is some increasing lower extremity edema.  Note her troponins are elevated this visit but EKG without stemi changes. We reviewed her gi history, procedures, labs and plan of care. Do note her red cells are macrocytic. Her CRI has worsened- sees Dr Cherylann Ratel. Says she has b12 injections due to history of deficency of this.  PREVIOUS ENDOSCOPIES:       EGD 09/14/22- Dr Shana Chute- normal esophagus, mutliple gastric polyps without  bleeding, 30mm sessile polyp in the 3rd portion of duodenum,  2 folds distal and  on the opposite wall to the major papilla. The polyp spanned 4 folds and covered 70% of the duodenal circumference. The polyp was partially removed in piecemeal with a hot snare. Polyp resection was  incomplete due to difficulty in visualization and achieving a stable position. Area was tattooed with an injection of Spot (carbon black) two folds proximal to the polyp.   biopsies demonstrated again this was an adenoma. There was no high grade dysplasia but adenoma was present on cauterized edge. She has seen Dr Kathrynn Ducking for discussion regarding surgery for complete removal - was also seen at [facility] ger[facility]) for further surgical pre-eval- noted to be high risk for resection- Dr Kathrynn Ducking and she discussed this and ultimately she declined surgical resection VCE 12/23- Dr Timothy Lasso- Study is complete. Capsule reached cecum. Normal small bowel transit. Large sessile polyp in D3. No bleeding source identified.  EGD 12/23- Dr Tobi Bastos- Normal esophagus. A few gastric polyps. Described as small semi sessile polyps with stigmata of recent bleeding were found in the greater curvature of the stomach. A single large carpet like polyp was found in the third portion of the duodenum. Pathology returned duodenal tubular adenoma negative for high-grade dysplasia and malignancy.  Colonoscopy 12/23- Dr Tobi Bastos- Preparation of the colon was fair. Examined portion of the ileum was normal. Entire examined colon is normal. No specimens collected  Past Medical History:  Diagnosis Date   Anemia    CAD (coronary artery disease)    a. NSTEMI 09/2015: cardiac cath: LM no obs dz, mLAD 95% s/p PCI/DES 0%, LCx no obs, RCA no obs, LVEF 55-65%   Chronic kidney disease (CKD), stage III (moderate) (HCC)    Diabetes mellitus with complication (HCC)    HLD (hyperlipidemia)    Hypertension    Low blood potassium    NSTEMI (non-ST elevated myocardial infarction)  (HCC)    PONV (postoperative nausea and vomiting)    Valvular heart disease    a. 09/2015: EF 55-60%, challenging images unable to exclude mild mid-distal anterior to apical HK, nl LV dias fxn, mild AI/MR, normal size of left atrium, RV systolic function normal, PASP normal      Past Surgical History:  Procedure Laterality Date   BACK SURGERY     CARDIAC CATHETERIZATION N/A 09/23/2015   Procedure: Right and Left Heart Cath;  Surgeon: Antonieta Iba, MD;  Location: [facility] INVASIVE CV LAB;  Service: Cardiovascular;  Laterality: N/A;   CARDIAC CATHETERIZATION N/A 09/23/2015   Procedure: Coronary Stent Intervention;  Surgeon: Alwyn Pea, MD;  Location: [facility] INVASIVE CV LAB;  Service: Cardiovascular;  Laterality: N/A;   CATARACT EXTRACTION W/PHACO Left 04/26/2017   Procedure: CATARACT EXTRACTION PHACO AND INTRAOCULAR LENS PLACEMENT (IOC) LEFT DIABETIC;  Surgeon: Lockie Mola, MD;  Location: [facility];  Service: Ophthalmology;  Laterality: Left;  diabetic-oral meds   CATARACT EXTRACTION W/PHACO Right 05/31/2017   Procedure: CATARACT EXTRACTION PHACO AND INTRAOCULAR LENS PLACEMENT (IOC) RIGHT DIABETIC;  Surgeon: Lockie Mola, MD;  Location: [facility];  Service: Ophthalmology;  Laterality: Right;   CHOLECYSTECTOMY N/A 06/16/2016   Procedure: LAPAROSCOPIC CHOLECYSTECTOMY WITH INTRAOPERATIVE CHOLANGIOGRAM;  Surgeon: Nadeen Landau, MD;  Location: [facility] ORS;  Service: General;  Laterality: N/A;   COLONOSCOPY WITH PROPOFOL N/A 06/26/2022   Procedure: COLONOSCOPY WITH PROPOFOL;  Surgeon: Wyline Mood, MD;  Location: [facility] END[facility]COPY;  Service: Gastroenterology;  Laterality: N/A;   CORONARY STENT PLACEMENT     ESOPHAGOGASTRODUODENOSCOPY N/A 06/30/2022   Procedure: ESOPHAGOGASTRODUODENOSCOPY (EGD);  Surgeon: Toney Reil, MD;  Location: [facility] ENDOSCOPY;  Service: Gastroenterology;  Laterality: N/A;   ESOPHAGOGASTRODUODENOSCOPY (EGD) WITH PROPOFOL N/A 06/26/2022    Procedure: ESOPHAGOGASTRODUODENOSCOPY (EGD) WITH PROPOFOL;  Surgeon: Wyline Mood, MD;  Location: [facility] ENDOSCOPY;  Service: Gastroenterology;  Laterality: N/A;   GIVENS CAPSULE STUDY N/A 06/30/2022   Procedure: GIVENS CAPSULE STUDY;  Surgeon: Toney Reil, MD;  Location: [facility] ENDOSCOPY;  Service: Gastroenterology;  Laterality: N/A;  PLACE CAPSULE DURING EGD   JOINT REPLACEMENT     LUMBAR FUSION     TOTAL HIP ARTHROPLASTY      Family History  Problem Relation Age of Onset   Diabetes Mother    Hypertension Mother    Heart disease Father    Cancer Maternal Grandfather      Social History   Tobacco Use   Smoking status: Never   Smokeless tobacco: Never  Vaping Use   Vaping Use: Never used  Substance Use Topics   Alcohol use: No    Alcohol/week: 0.0 standard drinks of alcohol   Drug use: No    Prior to Admission medications   Medication Sig Start Date End Date Taking? Authorizing Provider  aspirin EC 81 MG tablet Take 81 mg by mouth daily.   Yes [provider]  Cholecalciferol (VITAMIN D3) 2000 units TABS Take 2,000 Units by mouth daily.   Yes [provider]  doxazosin (CARDURA) 2 MG tablet Take 1 tablet (2 mg total) by mouth 2 (two) times daily. 02/04/22  Yes Antonieta Iba, MD  ferrous gluconate (FERGON) 324 MG tablet Take 1 tablet (324 mg total) by mouth daily with breakfast. 07/28/22  Yes Rickard Patience, MD  furosemide (LASIX) 40 MG tablet Take 1 tablet (40 mg total) by mouth daily. 02/04/22  Yes Antonieta Iba, MD  metoprolol succinate (TOPROL-XL) 100 MG 24 hr tablet Take 1 tablet (100 mg total) by mouth daily. 08/09/22  Yes Antonieta Iba, MD  Multiple Vitamins-Minerals (MULTIVITAMIN WITH MINERALS) tablet Take 1 tablet by mouth daily. 07/01/22 07/01/23 Yes Enedina Finner, MD  pantoprazole (PROTONIX) 40 MG tablet Take 1 tablet (40 mg total) by mouth daily. 07/02/22  Yes Enedina Finner, MD  pioglitazone (ACTOS) 30 MG tablet Take 30 mg by mouth daily.  05/07/18   Yes [provider]  rosuvastatin (CRESTOR) 40 MG tablet Take 1 tablet (40 mg total) by mouth daily. 02/04/22  Yes Gollan, Tollie Pizza, MD  triamterene-hydrochlorothiazide (DYAZIDE) 37.5-25 MG capsule Take 1 capsule by mouth every morning. 11/02/22 11/02/23 Yes [provider]  TRULICITY 1.5 MG/0.5ML SOPN INJECT 1.5 MG SUBCUTANEOUSLY EVERY 7 (SEVEN) DAYS 07/02/18  Yes [provider]    Current Facility-Administered Medications  Medication Dose Route Frequency Provider Last Rate Last Admin   acetaminophen (TYLENOL) tablet 650 mg  650 mg Oral Q6H PRN Andris Baumann, MD       Or   acetaminophen (TYLENOL) suppository 650 mg  650 mg Rectal Q6H PRN Andris Baumann, MD       cholecalciferol (VITAMIN D3) 25 MCG (1000 UNIT) tablet 2,000 Units  2,000 Units Oral Daily Andris Baumann, MD       doxazosin (CARDURA) tablet 2 mg  2 mg Oral BID Andris Baumann, MD       furosemide (LASIX) tablet 40 mg  40 mg Oral Daily Lindajo Royal V, MD       insulin aspart (novoLOG) injection 0-20 Units  0-20 Units Subcutaneous Q4H Andris Baumann, MD       ondansetron [facility]) tablet 4 mg  4 mg Oral Q6H PRN Andris Baumann, MD       Or   ondansetron [facility]) injection 4 mg  4 mg Intravenous Q6H PRN Andris Baumann, MD       rosuvastatin (CRESTOR) tablet 40 mg  40 mg Oral Daily Andris Baumann, MD       Current Outpatient Medications  Medication Sig Dispense Refill   aspirin EC 81 MG tablet Take 81 mg by mouth daily.     Cholecalciferol (VITAMIN D3) 2000 units TABS Take 2,000 Units by mouth daily.     doxazosin (CARDURA) 2 MG tablet Take 1 tablet (2 mg total) by mouth 2 (two) times daily. 180 tablet 3   ferrous gluconate (FERGON) 324 MG tablet Take 1 tablet (324 mg total) by mouth daily with breakfast. 60 tablet 3   furosemide (LASIX) 40 MG tablet Take 1 tablet (40 mg total) by mouth daily. 90 tablet 3   metoprolol succinate (TOPROL-XL) 100 MG 24 hr tablet Take 1 tablet (100 mg total) by mouth  daily. 90 tablet 1   Multiple Vitamins-Minerals (MULTIVITAMIN WITH MINERALS) tablet Take 1 tablet by mouth daily. 120 tablet 2   pantoprazole (PROTONIX) 40 MG tablet Take 1 tablet (40 mg total) by mouth daily. 30 tablet 1   pioglitazone (ACTOS) 30 MG tablet  Take 30 mg by mouth daily.      rosuvastatin (CRESTOR) 40 MG tablet Take 1 tablet (40 mg total) by mouth daily. 90 tablet 3   triamterene-hydrochlorothiazide (DYAZIDE) 37.5-25 MG capsule Take 1 capsule by mouth every morning.     TRULICITY 1.5 MG/0.5ML SOPN INJECT 1.5 MG SUBCUTANEOUSLY EVERY 7 (SEVEN) DAYS      Allergies as of 11/10/2022 - Review Complete 11/10/2022  Allergen Reaction Noted   Ferrous sulfate Itching 09/22/2015   Keflex [cephalexin] Itching 09/22/2015   Percocet [oxycodone-acetaminophen] Itching 09/22/2015   Vicodin [hydrocodone-acetaminophen] Itching 09/22/2015   Ace inhibitors Rash 10/12/2015   Penicillins Rash 10/12/2015     Review of Systems:    All systems reviewed and negative except where noted in HPI with exception of orthopnea.     Physical Exam:  Vital signs in last 24 hours: Temp:  [97.5 F (36.4 C)-98.2 F (36.8 C)] 98 F (36.7 C) (05/10 0508) Pulse Rate:  [42-78] 60 (05/10 0700) Resp:  [17-22] 18 (05/10 0700) BP: (100-134)/(40-69) 117/44 (05/10 0700) SpO2:  [91 %-100 %] 100 % (05/10 0700)   General:   Pleasant woman in NAD Head:  Normocephalic and atraumatic. Eyes:   No icterus.   Conjunctiva pink. Ears:  Normal auditory acuity. Mouth: Mucosa pink moist, no lesions. Neck:  Supple; no masses felt Lungs:  Respirations even and unlabored. Lungs clear to auscultation bilaterally.   No wheezes, crackles, or rhonchi. Wearing 02 via . There is mild DOE when she speaks that resolves easily Heart:  S1S2, RRR, grade II/VI systolic murmer. no RG. 2-3+ bilateral lower extremity edema. Sinus rhythm on monitor. Abdomen:   Obese soft, nondistended, nontender. Normal bowel sounds. No appreciable masses or  hepatomegaly. No rebound signs or other peritoneal signs. Rectal:  stool firm and brown. No melena or hematochezia  Msk:  MAEW x4, No clubbing or cyanosis. Strength 4/5. Symmetrical without gross deformities. Neurologic:  Alert and  oriented x4;  Cranial nerves II-XII intact.  Skin:  Warm, dry, pink without significant lesions or rashes. Psych:  Alert and cooperative. Normal affect. Mild forgetfulness  LAB RESULTS: Recent Labs    11/10/22 1456 11/10/22 1634 11/11/22 0500  WBC 4.6 4.7 4.8  HGB 7.7* 8.2* 8.5*  HCT 26.0* 28.2* 28.5*  PLT 188 210 195   BMET Recent Labs    11/10/22 1634 11/11/22 0500  NA 137 139  K 3.8 3.9  CL 100 106  CO2 26 26  GLUCOSE 183* 117*  BUN 59* 53*  CREATININE 2.80* 2.57*  CALCIUM 8.6* 8.3*   LFT Recent Labs    11/10/22 1634  PROT 7.2  ALBUMIN 3.7  AST 30  ALT 13  ALKPHOS 96  BILITOT 0.8   PT/INR Recent Labs    11/10/22 1634  LABPROT 16.6*  INR 1.3*    STUDIES: DG Chest 2 View  Result Date: 11/10/2022 CLINICAL DATA:  Weakness EXAM: CHEST - 2 VIEW COMPARISON:  06/25/2022, 07/24/2014 FINDINGS: Cardiomegaly. Aortic atherosclerosis. Probable tiny pleural effusions. Mild diffuse interstitial opacity, no change and possibly due to chronic disease. No consolidation or pneumothorax. IMPRESSION: Cardiomegaly with trace pleural effusions. Electronically Signed   By: Jasmine Pang M.D.   On: 11/10/2022 17:22       Impression / Plan:   Acute on chronic anemia- broad ddx- her iron studies 2w ago were pretty unremarkable. There is no melena or frank bleeding on exam. It appears she has been taking her PPI and is not using heavy nsaids. Macrocytic  cells are not really compatible with chronic blood loss,; she does have history of b12 deficiency- however note she does have history of incompletely resected duodenal adenoma but has declined surgery for complete removal and she is a high risk candidate for sedated procedures. She is hemodynamically  stable and hgb improved somewhat after transfusion.  Agree with ppi and following hgb. Consider hematology consult- may have other reasons for anemia exacerbation.  Thank you very much for this consult. These services were provided by Vevelyn Pat, NP-C, in collaboration with Regis Bill MD, with whom I have discussed this patient in full.   Vevelyn Pat, NP-C

## 2022-11-11 NOTE — ED Notes (Signed)
Pt called out requesting assistance with boosting up in bed and changing her brief. Both requests accommodated. Peri care performed and dry linens placed on bed. Pt repositioned for comfort. Pt now resting in bed free from sign of distress. Denies pain or further needs. Pt breathing unlabored speaking in full sentences with symmetric chest rise and fall. Bed low and locked with side rails raised x2. Call bell in reach and monitor in place. Pt watching tv.

## 2022-11-12 DIAGNOSIS — D62 Acute posthemorrhagic anemia: Secondary | ICD-10-CM | POA: Diagnosis not present

## 2022-11-12 LAB — BRAIN NATRIURETIC PEPTIDE: B Natriuretic Peptide: 441.9 pg/mL — ABNORMAL HIGH (ref 0.0–100.0)

## 2022-11-12 LAB — BASIC METABOLIC PANEL
Anion gap: 8 (ref 5–15)
BUN: 53 mg/dL — ABNORMAL HIGH (ref 8–23)
CO2: 25 mmol/L (ref 22–32)
Calcium: 8.3 mg/dL — ABNORMAL LOW (ref 8.9–10.3)
Chloride: 105 mmol/L (ref 98–111)
Creatinine, Ser: 2.55 mg/dL — ABNORMAL HIGH (ref 0.44–1.00)
GFR, Estimated: 18 mL/min — ABNORMAL LOW (ref >60)
Glucose, Bld: 88 mg/dL (ref 70–99)
Potassium: 3.8 mmol/L (ref 3.5–5.1)
Sodium: 138 mmol/L (ref 135–145)

## 2022-11-12 LAB — CBG MONITORING, ED
Glucose-Capillary: 106 mg/dL — ABNORMAL HIGH (ref 70–99)
Glucose-Capillary: 126 mg/dL — ABNORMAL HIGH (ref 70–99)
Glucose-Capillary: 149 mg/dL — ABNORMAL HIGH (ref 70–99)
Glucose-Capillary: 90 mg/dL (ref 70–99)

## 2022-11-12 LAB — CBC
HCT: 27.6 % — ABNORMAL LOW (ref 36.0–46.0)
Hemoglobin: 8.1 g/dL — ABNORMAL LOW (ref 12.0–15.0)
MCH: 29.9 pg (ref 26.0–34.0)
MCHC: 29.3 g/dL — ABNORMAL LOW (ref 30.0–36.0)
MCV: 101.8 fL — ABNORMAL HIGH (ref 80.0–100.0)
Platelets: 179 10*3/uL (ref 150–400)
RBC: 2.71 MIL/uL — ABNORMAL LOW (ref 3.87–5.11)
RDW: 18.2 % — ABNORMAL HIGH (ref 11.5–15.5)
WBC: 4.5 10*3/uL (ref 4.0–10.5)
nRBC: 0 % (ref 0.0–0.2)

## 2022-11-12 LAB — GLUCOSE, CAPILLARY
Glucose-Capillary: 94 mg/dL (ref 70–99)
Glucose-Capillary: 126 mg/dL — ABNORMAL HIGH (ref 70–99)
Glucose-Capillary: 106 mg/dL — ABNORMAL HIGH (ref 70–99)

## 2022-11-12 LAB — PHOSPHORUS: Phosphorus: 4 mg/dL (ref 2.5–4.6)

## 2022-11-12 LAB — MAGNESIUM: Magnesium: 2.6 mg/dL — ABNORMAL HIGH (ref 1.7–2.4)

## 2022-11-12 MED ORDER — PSYLLIUM 95 % PO PACK
1.0000 | PACK | Freq: Every day | ORAL | Status: DC
  Administered 2022-11-12 – 2022-11-14 (×3): 1 via ORAL
  Filled 2022-11-12 (×4): qty 1

## 2022-11-12 MED ORDER — MIDODRINE HCL 5 MG PO TABS: 5.0000 mg | ORAL_TABLET | Freq: Three times a day (TID) | ORAL | Status: DC | PRN

## 2022-11-12 MED ORDER — POLYETHYLENE GLYCOL 3350 17 G PO PACK
17.0000 g | PACK | Freq: Every day | ORAL | Status: DC
  Administered 2022-11-12 – 2022-11-14 (×3): 17 g via ORAL
  Filled 2022-11-12 (×4): qty 1

## 2022-11-12 NOTE — Progress Notes (Signed)
[facility] Progress Note  Patient: Melissa Christian    ZOX:[id number]  DOA: 11/10/2022     Date of Service: the patient was seen and examined on 11/12/2022  Chief Complaint  Patient presents with   Shortness of Breath   Brief hospital course:  HPI: RAESHAWN Christian is a 82 y.o. female with medical history significant for morbid obesity, CAD, diabetes mellitus, stage IV CKD and hypertension, with chronic iron deficiency anemia due to suspected occult chronic GI blood loss but with negative extensive GI workup  in December 2023 during a hospitalization for acute blood loss anemia requiring 2 units PRBCs for hemoglobin of 6.7 (negative EGD, colonoscopy and capsule endoscopy), and who is currently on outpatient iron transfusions, who was sent from the hematology office for admission for symptomatic anemia.  Patient reportedly had an unexpected drop in her hemoglobin to 7.7 from 8.9 two weeks prior and additionally was symptomatic for dyspnea on exertion and weakness.  She reported having black stool, similar to when she was hospitalized back in December.  She denies chest pain.She received an iron transfusion prior to being sent to the ED.   ED course and data review: Vitals initially within normal limits though blood pressure was a bit soft at the time of admission at 117/42.  Hemoglobin 8.2, up from 7.7 earlier in the day at the hematology clinic.  Troponin 20.  Creatinine 2.8, up from her baseline of 1.37 and BUN 59. Stool guaiac positive. EKG, personally viewed and interpreted showing sinus at 70 with nonspecific ST-T wave changes. Chest x-ray showing cardiomegaly with trace pleural effusions. Patient was started on a Protonix infusion and a unit of PRBCs was ordered. Hospitalist consulted for admission.   Assessment and Plan:  Symptomatic anemia secondary to iron deficiency and CKD Transferrin saturation 10% on 10/26/2022, patient received Venofer IV infusion on 11/10/2022.  Patient is  following hematology as an outpatient. 5/9 1 unit PRBC transfusion given.  Posttransfusion Hb 8.5 Trend H&H and transfuse if hemoglobin less than 7 GI consulted, recommended to follow-up with hematologist, no intervention at this time. As per GI anemia less likely due to GI bleed, continue PPI. Nephrology consulted for possible Epogen due to CKD, could be causing anemia 5/11 8.1, stable continue to monitor  Chronic diastolic CHF exacerbation Anasarca, patient has significant bilateral lower extremity edema 08/24/2022 TTE shows LVEF 60 to 65%, grade 2 diastolic dysfunction.  Mild to mild valvular abnormality.  Discontinued oral Lasix Started Lasix IV infusion Monitor renal functions and daily body weight Follow nephrology for further recommendation   AKI on CKD stage IIIb/IV Baseline creatinine 1.37-1.9, eGFR 39--25 Creatinine 2.8 on admission Creatinine 2.57--2.55, continue to monitor renal functions Avoid nephrotoxic medications Nephrology consulted   CAD, HTN, HLD Mildly elevated troponin most likely due to demand ischemia, patient denied any chest pain Blood pressure is soft, continue to monitor Continue Crestor 40 mg p.o. daily home dose Held aspirin due to symptomatic anemia Held metoprolol, triamterene-hydrochlorothiazide, and Cardura due to hypotension 5/10 started midodrine 5 mg p.o. 3 times daily with holding parameters 5/11 BP improved, changed to midodrine to prn   IDDM T2 Patient is on Trulicity and Actos at home which has been held for now [employer] sliding scale, monitor CBG, continue diabetic diet   Abnormal TSH, TSH level 4.9 slightly elevated, free T4 within normal range 1.1 Follow with PCP to repeat TFTs after 4 to 6 weeks.   There is no height or weight on file to  calculate BMI.  Interventions:     Diet: Diabetic diet DVT Prophylaxis: SCD, pharmacological prophylaxis contraindicated due to symptomatic anemia    Advance goals of care  discussion: Full code  Family Communication: family was not present at bedside, at the time of interview.  The pt provided permission to discuss medical plan with the family. Opportunity was given to ask question and all questions were answered satisfactorily.   Disposition:  Pt is from Home, admitted with symptomatic anemia, found to have anasarca, lower extremity edema, started Lasix IV infusion, nephrology consulted, which precludes a safe discharge. Discharge to home with [facility] vs SNF TBD after PT/OT eval, when clinically stable.  Subjective: No significant events overnight, patient feels significant improvement in the lower extremity edema and seems happy and moving her legs on the bed freely without any restriction.  Denies any worsening of shortness of breath, no chest pain or palpitations, no any other complaints.  Patient seems motivated to walk around, advised to ask help from the nurses to ambulate with assistance. We will continue current treatment and eval tomorrow a.m.   Physical Exam: General: NAD, lying comfortably Appear in no distress, affect appropriate Eyes: PERRLA ENT: Oral Mucosa Clear, moist  Neck: no JVD,  Cardiovascular: S1 and S2 Present, no Murmur,  Respiratory: good respiratory effort, Bilateral Air entry equal and Decreased, no Crackles, occasional wheezes Abdomen: Bowel Sound present, Soft, obese and no tenderness, mild edema of abdominal wall Skin: no rashes Extremities: 2-3+ Pedal edema, no calf tenderness.  Edema is improving Neurologic: without any new focal findings Gait not checked due to patient safety concerns  Vitals:   11/12/22 0400 11/12/22 0638 11/12/22 0730 11/12/22 1246  BP: (!) 120/58  (!) 136/52 (!) 117/48  Pulse: 79  77 78  Resp: (!) 21  18 20   Temp:  98.4 F (36.9 C)  98.1 F (36.7 C)  TempSrc:  Oral  Oral  SpO2: 97%  98% 95%    Intake/Output Summary (Last 24 hours) at 11/12/2022 1530 Last data filed at 11/12/2022 0910 Gross per 24  hour  Intake 37.43 ml  Output 1650 ml  Net -1612.57 ml   There were no vitals filed for this visit.  Data Reviewed: I have personally reviewed and interpreted daily labs, tele strips, imagings as discussed above. I reviewed all nursing notes, pharmacy notes, vitals, pertinent old records I have discussed plan of care as described above with RN and patient/family.  CBC: Recent Labs  Lab 11/10/22 1456 11/10/22 1634 11/11/22 0500 11/12/22 0354  WBC 4.6 4.7 4.8 4.5  NEUTROABS  --  3.9  --   --   HGB 7.7* 8.2* 8.5* 8.1*  HCT 26.0* 28.2* 28.5* 27.6*  MCV 101.6* 102.5* 99.3 101.8*  PLT 188 210 195 179   Basic Metabolic Panel: Recent Labs  Lab 11/10/22 1634 11/11/22 0500 11/12/22 0354  NA 137 139 138  K 3.8 3.9 3.8  CL 100 106 105  CO2 26 26 25   GLUCOSE 183* 117* 88  BUN 59* 53* 53*  CREATININE 2.80* 2.57* 2.55*  CALCIUM 8.6* 8.3* 8.3*  MG  --   --  2.6*  PHOS  --   --  4.0    Studies: No results found.  Scheduled Meds:  cholecalciferol  2,000 Units Oral Daily   epoetin (EPOGEN/PROCRIT) injection  20,000 Units Subcutaneous Weekly   insulin aspart  0-20 Units Subcutaneous Q4H   rosuvastatin  40 mg Oral Daily   Continuous Infusions:  furosemide (LASIX)  200 mg in dextrose 5 % 100 mL (2 mg/mL) infusion 4 mg/hr (11/12/22 0910)   PRN Meds: acetaminophen **OR** acetaminophen, ipratropium-albuterol, midodrine, ondansetron **OR** ondansetron (ZOFRAN) IV  Time spent: 50 minutes  Author: Gillis Santa. MD [facility] 11/12/2022 3:30 PM  To reach On-call, see care teams to locate the attending and reach out to them via [email or web address]. If 7PM-7AM, please contact night-coverage If you still have difficulty reaching the attending provider, please page the [facility] (Director on Call) for [facility] on amion for assistance.

## 2022-11-12 NOTE — Evaluation (Signed)
Physical Therapy Evaluation Patient Details Name: Melissa Christian MRN: [id number] DOB: Aug 21, 1940 Today's Date: 11/12/2022  History of Present Illness  Pt admitted for anemia. Pt with history of DM, obesity, CAD, HTN, and CKD.  Clinical Impression  Pt is a pleasant 82 year old female who was admitted for anemia. Pt performs bed mobility with min assist and transfers/ambulation with cga and RW. Pt demonstrates deficits with strength/mobility/endurance. Would benefit from skilled PT to address above deficits and promote optimal return to PLOF. Pt requested PT to provide telephone update to son on mobility status. In response, son asking for medical update, sent secure chat to care team. Pt will continue to receive skilled PT services while admitted and will defer to TOC/care team for updates regarding disposition planning.  SaO2 on room air at rest = 95% SaO2 on room air while ambulating = 83% SaO2 on 2 liters of O2 while ambulating = 94%       Recommendations for follow up therapy are one component of a multi-disciplinary discharge planning process, led by the attending physician.  Recommendations may be updated based on patient status, additional functional criteria and insurance authorization.  Follow Up Recommendations       Assistance Recommended at Discharge PRN  Patient can return home with the following  A little help with walking and/or transfers;Assist for transportation;Help with stairs or ramp for entrance    Equipment Recommendations None recommended by PT  Recommendations for Other Services       Functional Status Assessment Patient has had a recent decline in their functional status and demonstrates the ability to make significant improvements in function in a reasonable and predictable amount of time.     Precautions / Restrictions Precautions Precautions: Fall Restrictions Weight Bearing Restrictions: No      Mobility  Bed Mobility Overal bed mobility: Needs  Assistance Bed Mobility: Supine to Sit     Supine to sit: Min assist     General bed mobility comments: needs initial assist for B LE management, good balance once seated at EOB.    Transfers Overall transfer level: Needs assistance Equipment used: Rolling walker (2 wheels) Transfers: Sit to/from Stand Sit to Stand: Min guard           General transfer comment: takes increased effort, good balance once standing    Ambulation/Gait Ambulation/Gait assistance: Min guard Gait Distance (Feet): 40 Feet Assistive device: Rolling walker (2 wheels) Gait Pattern/deviations: Step-through pattern       General Gait Details: 2 short gait bouts limited by line/leads. Safe technique with RW. Able to turn in circle without LOB  Stairs            Wheelchair Mobility    Modified Rankin (Stroke Patients Only)       Balance Overall balance assessment: Modified Independent                                           Pertinent Vitals/Pain Pain Assessment Pain Assessment: No/denies pain    Home Living Family/patient expects to be discharged to:: Private residence Living Arrangements: Alone Available Help at Discharge: Family;Available PRN/intermittently Type of Home: House Home Access: Stairs to enter Entrance Stairs-Rails: None Entrance Stairs-Number of Steps: 1   Home Layout: One level Home Equipment: Agricultural consultant (2 wheels)      Prior Function Prior Level of Function : Independent/Modified Independent  Mobility Comments: reports zero falls and uses RW for ambulation.reports she sleeps in recliner ADLs Comments: doesn't drive, indep with ADLs     Hand Dominance        Extremity/Trunk Assessment   Upper Extremity Assessment Upper Extremity Assessment: Overall WFL for tasks assessed    Lower Extremity Assessment Lower Extremity Assessment: Generalized weakness (B LE grossly 4/5)       Communication   Communication:  No difficulties  Cognition Arousal/Alertness: Awake/alert Behavior During Therapy: WFL for tasks assessed/performed Overall Cognitive Status: Within Functional Limits for tasks assessed                                 General Comments: pleasant and agreeable to session        General Comments      Exercises     Assessment/Plan    PT Assessment Patient needs continued PT services  PT Problem List Decreased strength;Decreased activity tolerance;Decreased balance;Decreased mobility;Cardiopulmonary status limiting activity       PT Treatment Interventions Gait training;Therapeutic exercise    PT Goals (Current goals can be found in the Care Plan section)  Acute Rehab PT Goals Patient Stated Goal: to go home tomorrow PT Goal Formulation: With patient Time For Goal Achievement: 11/26/22 Potential to Achieve Goals: Good    Frequency Min 2X/week     Co-evaluation               AM-PAC PT "6 Clicks" Mobility  Outcome Measure Help needed turning from your back to your side while in a flat bed without using bedrails?: None Help needed moving from lying on your back to sitting on the side of a flat bed without using bedrails?: A Little Help needed moving to and from a bed to a chair (including a wheelchair)?: A Little Help needed standing up from a chair using your arms (e.g., wheelchair or bedside chair)?: A Little Help needed to walk in hospital room?: A Little Help needed climbing 3-5 steps with a railing? : A Little 6 Click Score: 19    End of Session Equipment Utilized During Treatment: Gait belt;Oxygen Activity Tolerance: Patient tolerated treatment well Patient left: in bed (pt declined to sit in recliner) Nurse Communication: Mobility status PT Visit Diagnosis: Muscle weakness (generalized) (M62.81);Difficulty in walking, not elsewhere classified (R26.2)    Time: 0981-1914 PT Time Calculation (min) (ACUTE ONLY): 26 min   Charges:   PT  Evaluation $PT Eval Low Complexity: 1 Low PT Treatments $Gait Training: 8-22 mins        Elizabeth Palau, PT, DPT, GCS [phone]   [name] 11/12/2022, 11:41 AM

## 2022-11-12 NOTE — Evaluation (Addendum)
Occupational Therapy Evaluation Patient Details Name: Melissa Christian MRN: [id number] DOB: [birth year] Today's Date: 11/12/2022   History of Present Illness Pt admitted for anemia. Pt with history of DM, obesity, CAD, HTN, and CKD.   Clinical Impression   Pt presents with generalized weakness, limited endurance, mild SOB, mild pain, and LE edema. PTA, pt lives alone, generally Mod I in BADL, receiving regular assistance from family members for shopping, transportation, and other IADL. She ambulates w/ a RW and denies a falls history. During today's evaluation, she is able to perform bed mobility, transfers, ambulation w/ RW, all w/ SUPV and extended time/effort. Requires Max A for donning socks. Pt reports that her LE are less swollen and less painful today compared to the past few days. Her O2 sats are at 90-93% throughout session. Recommend DC home with HHOT to assist pt in improving strength, endurance, and ease of performing fxl mobility tasks.    Recommendations for follow up therapy are one component of a multi-disciplinary discharge planning process, led by the attending physician.  Recommendations may be updated based on patient status, additional functional criteria and insurance authorization.   Assistance Recommended at Discharge Intermittent Supervision/Assistance  Patient can return home with the following A little help with bathing/dressing/bathroom;Assistance with cooking/housework;Assist for transportation    Functional Status Assessment  Patient has had a recent decline in their functional status and demonstrates the ability to make significant improvements in function in a reasonable and predictable amount of time.  Equipment Recommendations  None recommended by OT    Recommendations for Other Services       Precautions / Restrictions Precautions Precautions: Fall Restrictions Weight Bearing Restrictions: No      Mobility Bed Mobility Overal bed mobility: Needs  Assistance Bed Mobility: Supine to Sit, Sit to Supine     Supine to sit: Min guard Sit to supine: Min guard   General bed mobility comments: able to perform w/o assistance w/ extended time/effort    Transfers Overall transfer level: Needs assistance Equipment used: Rolling walker (2 wheels) Transfers: Sit to/from Stand Sit to Stand: Min guard           General transfer comment: takes increased effort, good balance once standing      Balance Overall balance assessment: Modified Independent                                         ADL either performed or assessed with clinical judgement   ADL                                               Vision         Perception     Praxis      Pertinent Vitals/Pain Pain Assessment Pain Assessment: Faces Faces Pain Scale: Hurts little more Pain Location: b/l LE Pain Descriptors / Indicators: Discomfort, Sore Pain Intervention(s): Repositioned     Hand Dominance     Extremity/Trunk Assessment Upper Extremity Assessment Upper Extremity Assessment: Overall WFL for tasks assessed   Lower Extremity Assessment Lower Extremity Assessment: Generalized weakness       Communication Communication Communication: No difficulties   Cognition Arousal/Alertness: Awake/alert Behavior During Therapy: WFL for tasks assessed/performed Overall Cognitive Status: Within Functional Limits for tasks assessed  General Comments       Exercises Other Exercises Other Exercises: Educ re: home safety, falls prevention   Shoulder Instructions      Home Living Family/patient expects to be discharged to:: Private residence Living Arrangements: Alone Available Help at Discharge: Family;Available PRN/intermittently;Friend(s) Type of Home: House Home Access: Stairs to enter [employer] of Steps: 1 Entrance Stairs-Rails: None Home Layout:  One level     Bathroom Shower/Tub: Producer, television/film/video: Handicapped height     Home Equipment: Agricultural consultant (2 wheels);Shower seat;Grab bars - toilet          Prior Functioning/Environment Prior Level of Function : Needs assist             Mobility Comments: reports zero falls and uses RW for ambulation.reports she sleeps in recliner ADLs Comments: Able to dress self but wears only flip-flops because cannot get on shoes or socks. Showers w/o physical assistance but only when someone is at the house. Family provide assistance with transportation, shopping        OT Problem List: Decreased activity tolerance;Impaired balance (sitting and/or standing)      OT Treatment/Interventions:      OT Goals(Current goals can be found in the care plan section) Acute Rehab OT Goals Patient Stated Goal: to go home today OT Goal Formulation: With patient Time For Goal Achievement: 11/26/22 Potential to Achieve Goals: Good  OT Frequency:      Co-evaluation              AM-PAC OT "6 Clicks" Daily Activity     Outcome Measure Help from another person eating meals?: None Help from another person taking care of personal grooming?: None Help from another person toileting, which includes using toliet, bedpan, or urinal?: A Little Help from another person bathing (including washing, rinsing, drying)?: A Little Help from another person to put on and taking off regular upper body clothing?: None Help from another person to put on and taking off regular lower body clothing?: A Lot 6 Click Score: 20   End of Session Equipment Utilized During Treatment: Rolling walker (2 wheels)  Activity Tolerance: Patient tolerated treatment well Patient left: in bed;with call bell/phone within reach  OT Visit Diagnosis: Muscle weakness (generalized) (M62.81);Unsteadiness on feet (R26.81)                Time: 1259-1316 OT Time Calculation (min): 17 min Charges:  OT General  Charges $OT Visit: 1 Visit OT Evaluation $OT Eval Low Complexity: 1 Low OT Treatments $Self Care/Home Management : 8-22 mins Latina Craver, PhD, MS, OTR/L 11/12/22, 2:01 PM

## 2022-11-12 NOTE — Progress Notes (Signed)
GI Inpatient Follow-up Note  Subjective:  Patient seen and is better after diuresis. Suspect symptoms were from heart failure exacerbation. BNP is elevated but unsure of baseline.  Scheduled Inpatient Medications:   cholecalciferol  2,000 Units Oral Daily   epoetin (EPOGEN/PROCRIT) injection  20,000 Units Subcutaneous Weekly   insulin aspart  0-20 Units Subcutaneous Q4H   rosuvastatin  40 mg Oral Daily    Continuous Inpatient Infusions:    furosemide (LASIX) 200 mg in dextrose 5 % 100 mL (2 mg/mL) infusion 4 mg/hr (11/12/22 0910)    PRN Inpatient Medications:  acetaminophen **OR** acetaminophen, ipratropium-albuterol, midodrine, ondansetron **OR** ondansetron (ZOFRAN) IV  Review of Systems:  Review of Systems  Constitutional:  Negative for chills and fever.  Respiratory:  Negative for shortness of breath.   Cardiovascular:  Positive for leg swelling.  Gastrointestinal:  Positive for constipation. Negative for abdominal pain, diarrhea, nausea and vomiting.  Musculoskeletal:  Positive for joint pain.  Skin:  Negative for rash.  Neurological:  Negative for focal weakness.  Psychiatric/Behavioral:  Negative for substance abuse.   All other systems reviewed and are negative.     Physical Examination: BP (!) 134/43 (BP Location: Left Arm)   Pulse 73   Temp 98.3 F (36.8 C) (Oral)   Resp 20   SpO2 100%  Gen: NAD, alert and oriented x 4 HEENT: PEERLA, EOMI, Neck: supple, no JVD or thyromegaly Chest: No respiratory distress Abd: soft, non-tender, non-distended Ext: trace edema, well perfused with 2+ pulses, Skin: no rash or lesions noted Lymph: no LAD  Data: Lab Results  Component Value Date   WBC 4.5 11/12/2022   HGB 8.1 (L) 11/12/2022   HCT 27.6 (L) 11/12/2022   MCV 101.8 (H) 11/12/2022   PLT 179 11/12/2022   Recent Labs  Lab 11/10/22 1634 11/11/22 0500 11/12/22 0354  HGB 8.2* 8.5* 8.1*   Lab Results  Component Value Date   NA 138 11/12/2022   K 3.8  11/12/2022   CL 105 11/12/2022   CO2 25 11/12/2022   BUN 53 (H) 11/12/2022   CREATININE 2.55 (H) 11/12/2022   Lab Results  Component Value Date   ALT 13 11/10/2022   AST 30 11/10/2022   ALKPHOS 96 11/10/2022   BILITOT 0.8 11/10/2022   Recent Labs  Lab 11/10/22 1634  INR 1.3*   Assessment/Plan: Melissa Christian is a 82 y.o. lady with PMH of HFpEF, CKD, and obesity here with heart failure exacerbation and mild drop in hemoglobin. No overt GI bleeding. Suspect lower hemoglobin is multifactorial from heart failure and CKD.  Recommendations:  - trend cbc - alternative work-up for anemia but likely multifactorial - treat constipation (ordered metamucil and miralax) - monitor for overt GI bleeding - we will sign off for now, please re-consult if any questions or concerns.  Melissa Lot MD, MPH [facility] GI

## 2022-11-12 NOTE — Progress Notes (Signed)
[facility]  PROGRESS NOTE   Subjective:   Patient seen at bedside comfortable.  Objective:  Vital signs: Blood pressure (!) 117/48, pulse 78, temperature 98.1 F (36.7 C), temperature source Oral, resp. rate 20, SpO2 95 %.  Intake/Output Summary (Last 24 hours) at 11/12/2022 1420 Last data filed at 11/12/2022 0910 Gross per 24 hour  Intake 37.43 ml  Output 1650 ml  Net -1612.57 ml   There were no vitals filed for this visit.   Physical Exam: General:  No acute distress  Head:  Normocephalic, atraumatic. Moist oral mucosal membranes  Eyes:  Anicteric  Neck:  Supple  Lungs:   Clear to auscultation, normal effort  Heart:  S1S2 no rubs  Abdomen:   Soft, nontender, bowel sounds present  Extremities:  peripheral edema.  Neurologic:  Awake, alert, following commands  Skin:  No lesions  Access:     Basic Metabolic Panel: Recent Labs  Lab 11/10/22 1634 11/11/22 0500 11/12/22 0354  NA 137 139 138  K 3.8 3.9 3.8  CL 100 106 105  CO2 26 26 25   GLUCOSE 183* 117* 88  BUN 59* 53* 53*  CREATININE 2.80* 2.57* 2.55*  CALCIUM 8.6* 8.3* 8.3*  MG  --   --  2.6*  PHOS  --   --  4.0   GFR: CrCl cannot be calculated (Unknown ideal weight.).  Liver Function Tests: Recent Labs  Lab 11/10/22 1634  AST 30  ALT 13  ALKPHOS 96  BILITOT 0.8  PROT 7.2  ALBUMIN 3.7   No results for input(s): "LIPASE", "AMYLASE" in the last 168 hours. No results for input(s): "AMMONIA" in the last 168 hours.  CBC: Recent Labs  Lab 11/10/22 1456 11/10/22 1634 11/11/22 0500 11/12/22 0354  WBC 4.6 4.7 4.8 4.5  NEUTROABS  --  3.9  --   --   HGB 7.7* 8.2* 8.5* 8.1*  HCT 26.0* 28.2* 28.5* 27.6*  MCV 101.6* 102.5* 99.3 101.8*  PLT 188 210 195 179     HbA1C: Hgb A1c MFr Bld  Date/Time Value Ref Range Status  06/30/2022 03:19 AM 5.7 (H) 4.8 - 5.6 % Final    Comment:    (NOTE)         Prediabetes: 5.7 - 6.4         Diabetes: >6.4         Glycemic control for adults with  diabetes: <7.0   09/23/2015 06:26 AM 8.8 (H) 4.0 - 6.0 % Final    Urinalysis: Recent Labs    11/10/22 1627  COLORURINE YELLOW*  LABSPEC 1.017  PHURINE 6.0  GLUCOSEU NEGATIVE  HGBUR SMALL*  BILIRUBINUR NEGATIVE  KETONESUR NEGATIVE  PROTEINUR 100*  NITRITE NEGATIVE  LEUKOCYTESUR SMALL*      Imaging: DG Chest 2 View  Result Date: 11/10/2022 CLINICAL DATA:  Weakness EXAM: CHEST - 2 VIEW COMPARISON:  06/25/2022, 07/24/2014 FINDINGS: Cardiomegaly. Aortic atherosclerosis. Probable tiny pleural effusions. Mild diffuse interstitial opacity, no change and possibly due to chronic disease. No consolidation or pneumothorax. IMPRESSION: Cardiomegaly with trace pleural effusions. Electronically Signed   By: Jasmine Pang M.D.   On: 11/10/2022 17:22     Medications:    furosemide (LASIX) 200 mg in dextrose 5 % 100 mL (2 mg/mL) infusion 4 mg/hr (11/12/22 0910)    cholecalciferol  2,000 Units Oral Daily   epoetin (EPOGEN/PROCRIT) injection  20,000 Units Subcutaneous Weekly   insulin aspart  0-20 Units Subcutaneous Q4H   rosuvastatin  40 mg Oral Daily  Assessment/ Plan:     82 y.o.  female with past medical conditions including diabetes, obesity, CAD, hypertension, and chronic kidney disease., who was admitted to [facility] on 11/10/2022 for ABLA (acute blood loss anemia) [D62]   #1: Acute kidney injury: Renal indices are stable.  #2: Anasarca/hypertension/congestive heart failure: Continue Lasix drip for now.  #3: Diabetes: Continue insulin as per the protocols.  #4: Anemia: Will follow closely.    LOS: 2 Lorain Childes, MD [facility] 5/11/20242:20 PM

## 2022-11-13 DIAGNOSIS — D62 Acute posthemorrhagic anemia: Secondary | ICD-10-CM | POA: Diagnosis not present

## 2022-11-13 LAB — CBC
HCT: 26.6 % — ABNORMAL LOW (ref 36.0–46.0)
Hemoglobin: 8 g/dL — ABNORMAL LOW (ref 12.0–15.0)
MCH: 29.9 pg (ref 26.0–34.0)
MCHC: 30.1 g/dL (ref 30.0–36.0)
MCV: 99.3 fL (ref 80.0–100.0)
Platelets: 180 10*3/uL (ref 150–400)
RBC: 2.68 MIL/uL — ABNORMAL LOW (ref 3.87–5.11)
RDW: 17.2 % — ABNORMAL HIGH (ref 11.5–15.5)
WBC: 4.7 10*3/uL (ref 4.0–10.5)
nRBC: 0 % (ref 0.0–0.2)

## 2022-11-13 LAB — VITAMIN D 25 HYDROXY (VIT D DEFICIENCY, FRACTURES): Vit D, 25-Hydroxy: 69.99 ng/mL (ref 30–100)

## 2022-11-13 LAB — BASIC METABOLIC PANEL
Anion gap: 7 (ref 5–15)
BUN: 55 mg/dL — ABNORMAL HIGH (ref 8–23)
CO2: 28 mmol/L (ref 22–32)
Calcium: 8.3 mg/dL — ABNORMAL LOW (ref 8.9–10.3)
Chloride: 102 mmol/L (ref 98–111)
Creatinine, Ser: 2.51 mg/dL — ABNORMAL HIGH (ref 0.44–1.00)
GFR, Estimated: 19 mL/min — ABNORMAL LOW (ref >60)
Glucose, Bld: 119 mg/dL — ABNORMAL HIGH (ref 70–99)
Potassium: 3.4 mmol/L — ABNORMAL LOW (ref 3.5–5.1)
Sodium: 137 mmol/L (ref 135–145)

## 2022-11-13 LAB — GLUCOSE, CAPILLARY
Glucose-Capillary: 80 mg/dL (ref 70–99)
Glucose-Capillary: 99 mg/dL (ref 70–99)
Glucose-Capillary: 157 mg/dL — ABNORMAL HIGH (ref 70–99)
Glucose-Capillary: 147 mg/dL — ABNORMAL HIGH (ref 70–99)
Glucose-Capillary: 132 mg/dL — ABNORMAL HIGH (ref 70–99)

## 2022-11-13 LAB — BPAM RBC
Blood Product Expiration Date: 202406092359
Unit Type and Rh: 6200

## 2022-11-13 LAB — PHOSPHORUS: Phosphorus: 3.7 mg/dL (ref 2.5–4.6)

## 2022-11-13 LAB — TYPE AND SCREEN

## 2022-11-13 LAB — MAGNESIUM: Magnesium: 2.5 mg/dL — ABNORMAL HIGH (ref 1.7–2.4)

## 2022-11-13 MED ORDER — POTASSIUM CHLORIDE 20 MEQ PO PACK
40.0000 meq | PACK | Freq: Once | ORAL | Status: AC
  Administered 2022-11-13: 40 meq via ORAL
  Filled 2022-11-13: qty 2

## 2022-11-13 NOTE — Progress Notes (Signed)
[facility]  PROGRESS NOTE   Subjective:   Vitals are stable.  Patient feels well.  Objective:  Vital signs: Blood pressure (!) 127/53, pulse 76, temperature 97.7 F (36.5 C), temperature source Oral, resp. rate 18, height 5\' 4"  (1.626 m), weight 127.9 kg, SpO2 96 %.  Intake/Output Summary (Last 24 hours) at 11/13/2022 1325 Last data filed at 11/13/2022 1129 Gross per 24 hour  Intake 276.2 ml  Output 1750 ml  Net -1473.8 ml   Filed Weights   11/12/22 1946 11/13/22 0438  Weight: 128.5 kg 127.9 kg     Physical Exam: General:  No acute distress  Head:  Normocephalic, atraumatic. Moist oral mucosal membranes  Eyes:  Anicteric  Neck:  Supple  Lungs:   Clear to auscultation, normal effort  Heart:  S1S2 no rubs  Abdomen:   Soft, nontender, bowel sounds present  Extremities:  peripheral edema.  Neurologic:  Awake, alert, following commands  Skin:  No lesions  Access:     Basic Metabolic Panel: Recent Labs  Lab 11/10/22 1634 11/11/22 0500 11/12/22 0354 11/13/22 0530  NA 137 139 138 137  K 3.8 3.9 3.8 3.4*  CL 100 106 105 102  CO2 26 26 25 28   GLUCOSE 183* 117* 88 119*  BUN 59* 53* 53* 55*  CREATININE 2.80* 2.57* 2.55* 2.51*  CALCIUM 8.6* 8.3* 8.3* 8.3*  MG  --   --  2.6* 2.5*  PHOS  --   --  4.0 3.7   GFR: Estimated Creatinine Clearance: 22.9 mL/min (A) (by C-G formula based on SCr of 2.51 mg/dL (H)).  Liver Function Tests: Recent Labs  Lab 11/10/22 1634  AST 30  ALT 13  ALKPHOS 96  BILITOT 0.8  PROT 7.2  ALBUMIN 3.7   No results for input(s): "LIPASE", "AMYLASE" in the last 168 hours. No results for input(s): "AMMONIA" in the last 168 hours.  CBC: Recent Labs  Lab 11/10/22 1456 11/10/22 1634 11/11/22 0500 11/12/22 0354 11/13/22 0530  WBC 4.6 4.7 4.8 4.5 4.7  NEUTROABS  --  3.9  --   --   --   HGB 7.7* 8.2* 8.5* 8.1* 8.0*  HCT 26.0* 28.2* 28.5* 27.6* 26.6*  MCV 101.6* 102.5* 99.3 101.8* 99.3  PLT 188 210 195 179 180      HbA1C: Hgb A1c MFr Bld  Date/Time Value Ref Range Status  06/30/2022 03:19 AM 5.7 (H) 4.8 - 5.6 % Final    Comment:    (NOTE)         Prediabetes: 5.7 - 6.4         Diabetes: >6.4         Glycemic control for adults with diabetes: <7.0   09/23/2015 06:26 AM 8.8 (H) 4.0 - 6.0 % Final    Urinalysis: Recent Labs    11/10/22 1627  COLORURINE YELLOW*  LABSPEC 1.017  PHURINE 6.0  GLUCOSEU NEGATIVE  HGBUR SMALL*  BILIRUBINUR NEGATIVE  KETONESUR NEGATIVE  PROTEINUR 100*  NITRITE NEGATIVE  LEUKOCYTESUR SMALL*      Imaging: No results found.   Medications:    furosemide (LASIX) 200 mg in dextrose 5 % 100 mL (2 mg/mL) infusion 4 mg/hr (11/13/22 1058)    cholecalciferol  2,000 Units Oral Daily   epoetin (EPOGEN/PROCRIT) injection  20,000 Units Subcutaneous Weekly   insulin aspart  0-20 Units Subcutaneous Q4H   polyethylene glycol  17 g Oral Daily   psyllium  1 packet Oral Daily   rosuvastatin  40 mg  Oral Daily    Assessment/ Plan:     82 y.o.  female with past medical conditions including diabetes, obesity, CAD, hypertension, and chronic kidney disease., who was admitted to [facility] on 11/10/2022 for ABLA (acute blood loss anemia) [D62]    #1: Acute kidney injury: Renal indices are stable.   #2: Anasarca/hypertension/congestive heart failure: Continue Lasix drip for now.    #3: Diabetes: Continue insulin as per the protocols.   #4: Anemia: Continue Epogen.  Patient may need oral iron.  #5: Hypokalemia: Will supplement potassium 40 mEq today.  Will follow closely.   LOS: 3 Lorain Childes, MD [facility] 5/12/20241:25 PM

## 2022-11-13 NOTE — TOC Initial Note (Signed)
Transition of Care [facility]) - Initial/Assessment Note    Patient Details  Name: Melissa Christian MRN: [id number] Date of Birth: 15-Jul-1940  Transition of Care [facility]) CM/SW Contact:    Kemper Durie, RN Phone Number: 11/13/2022, 3:31 PM  Clinical Narrative:                  Patient admitted from home, lives alone, but has family that lives down the street.  PCP is Dr. Hyacinth Meeker, uses [facility].  Has walker and lift chair in the home.  Advised of recommendations for HHPT/OT, she agrees, does not have a preference.  Spoke with Elnita Maxwell with [facility], agrees to accept referral.   Expected Discharge Plan: Home w Home Health Services Barriers to Discharge: Continued Medical Work up   Patient Goals and CMS Choice Patient states their goals for this hospitalization and ongoing recovery are:: Home with Home Heatlh          Expected Discharge Plan and Services     Post Acute Care Choice: Home Health Living arrangements for the past 2 months: Single Family Home                           HH Arranged: PT, OT HH Agency: [facility] Date [facility] Agency Contacted: 11/13/22 Time HH Agency Contacted: 1530 Representative spoke with at [facility] Agency: Elnita Maxwell  Prior Living Arrangements/Services Living arrangements for the past 2 months: Single Family Home Lives with:: Self   Do you feel safe going back to the place where you live?: Yes      Need for Family Participation in Patient Care: Yes (Comment) Care giver support system in place?: Yes (comment) Current home services: DME    Activities of Daily Living Home Assistive Devices/Equipment: Cane (specify quad or straight) ADL Screening (condition at time of admission) Patient's cognitive ability adequate to safely complete daily activities?: Yes Is the patient deaf or have difficulty hearing?: No Does the patient have difficulty seeing, even when wearing glasses/contacts?: No Does the patient have difficulty concentrating,  remembering, or making decisions?: No Patient able to express need for assistance with ADLs?: Yes Does the patient have difficulty dressing or bathing?: Yes Independently performs ADLs?: Yes (appropriate for developmental age) Does the patient have difficulty walking or climbing stairs?: Yes Weakness of Legs: Both Weakness of Arms/Hands: None  Permission Sought/Granted                  Emotional Assessment Appearance:: Appears stated age Attitude/Demeanor/Rapport: Engaged Affect (typically observed): Calm Orientation: : Oriented to Self, Oriented to Place, Oriented to Situation, Oriented to  Time   Psych Involvement: No (comment)  Admission diagnosis:  UGIB (upper gastrointestinal bleed) [K92.2] Symptomatic anemia [D64.9] ABLA (acute blood loss anemia) [D62] Patient Active Problem List   Diagnosis Date Noted   Melena 11/10/2022   Acute on chronic blood loss anemia 11/10/2022   Hypotension 11/10/2022   Acute renal failure superimposed on stage 4 chronic kidney disease (HCC) 11/10/2022   Adenomatous duodenal polyp 07/28/2022   History of melena 07/01/2022   GIB (gastrointestinal bleeding) 06/26/2022   ABLA (acute blood loss anemia) 06/25/2022   CKD stage 4 due to type 2 diabetes mellitus (HCC) 06/25/2022   Anemia in chronic kidney disease (CKD) 06/18/2022   Absolute anemia 04/11/2022   Symptomatic anemia 04/11/2022   IDA (iron deficiency anemia) 04/11/2022   Complex renal cyst 05/23/2016   Encounter for medication management 05/09/2016  Morbid obesity due to excess calories (HCC)    CAD (coronary artery disease)    Pain in the chest    S/P cardiac cath    Chest pain 09/22/2015   Elevated troponin 09/22/2015   Type 2 diabetes mellitus (HCC) 09/22/2015   HTN (hypertension) 09/22/2015   HLD (hyperlipidemia) 09/22/2015   PCP:  Danella Penton, MD Pharmacy:   [id number] [place], [place] - [address] [place] [address] Phone: [phone] Fax:  [phone]     Social Determinants of Health (SDOH) Social History: SDOH Screenings   Food Insecurity: No Food Insecurity (11/12/2022)  Housing: Low Risk  (11/12/2022)  Transportation Needs: No Transportation Needs (11/12/2022)  Utilities: Not At Risk (11/12/2022)  Tobacco Use: Low Risk  (11/10/2022)   SDOH Interventions:     Readmission Risk Interventions     No data to display

## 2022-11-13 NOTE — Progress Notes (Signed)
Occupational Therapy Treatment Patient Details Name: Melissa Christian MRN: [id number] DOB: [birth year] Today's Date: 11/13/2022   History of present illness Pt is an 82 y/o female admitted for anemia. Pt with history of DM, obesity, CAD, HTN, and CKD.   OT comments  Patient received supine in bed and agreeable to OT. Pt able to come to EOB with Min guard this date. Pt endorsed mild SOB with activity. She required Min A for UB dressing and Max A for LB dressing. Pt then stood from EOB and took several steps toward recliner with CGA + RW. Pt left sitting up in recliner with all needs in reach. Pt is making progress toward goal completion. D/C recommendation remains appropriate. OT will continue to follow acutely.    Recommendations for follow up therapy are one component of a multi-disciplinary discharge planning process, led by the attending physician.  Recommendations may be updated based on patient status, additional functional criteria and insurance authorization.    Assistance Recommended at Discharge Intermittent Supervision/Assistance  Patient can return home with the following  A little help with bathing/dressing/bathroom;Assistance with cooking/housework;Assist for transportation;A little help with walking and/or transfers;Help with stairs or ramp for entrance   Equipment Recommendations  None recommended by OT    Recommendations for Other Services      Precautions / Restrictions Precautions Precautions: Fall Restrictions Weight Bearing Restrictions: No       Mobility Bed Mobility Overal bed mobility: Needs Assistance Bed Mobility: Supine to Sit     Supine to sit: Min guard     General bed mobility comments: increased time/effort required    Transfers Overall transfer level: Needs assistance Equipment used: Rolling walker (2 wheels) Transfers: Sit to/from Stand Sit to Stand: Min guard           General transfer comment: STS from EOB     Balance Overall  balance assessment: Modified Independent       ADL either performed or assessed with clinical judgement   ADL Overall ADL's : Needs assistance/impaired                 Upper Body Dressing : Minimal assistance;Sitting Upper Body Dressing Details (indicate cue type and reason): to don gown Lower Body Dressing: Maximal assistance;Sitting/lateral leans Lower Body Dressing Details (indicate cue type and reason): socks Toilet Transfer: Rolling walker (2 wheels);Min guard;Ambulation Toilet Transfer Details (indicate cue type and reason): simulated         Functional mobility during ADLs: Min guard;Rolling walker (2 wheels) (to take several steps (~5 ft) to bedside recliner)      Extremity/Trunk Assessment Upper Extremity Assessment Upper Extremity Assessment: Overall WFL for tasks assessed   Lower Extremity Assessment Lower Extremity Assessment: Generalized weakness        Vision Patient Visual Report: No change from baseline     Perception     Praxis      Cognition Arousal/Alertness: Awake/alert Behavior During Therapy: WFL for tasks assessed/performed Overall Cognitive Status: Within Functional Limits for tasks assessed            Exercises Other Exercises Other Exercises: Education provided re: energy conservation techniques (rest breaks, PLB)    Shoulder Instructions       General Comments Pt received on RA, SpO2 90% at rest. Pt declined wearing O2 for activity. O2 desatting to 85% with activity. SpO2 increasing to >90% with seated rest break and PLB.    Pertinent Vitals/ Pain       Pain Assessment Pain  Assessment: Faces Faces Pain Scale: Hurts a little bit Pain Location: BLEs Pain Descriptors / Indicators: Discomfort, Sore Pain Intervention(s): Monitored during session, Repositioned  Home Living          Prior Functioning/Environment              Frequency  Min 1X/week        Progress Toward Goals  OT Goals(current goals can  now be found in the care plan section)  Progress towards OT goals: Progressing toward goals  Acute Rehab OT Goals Patient Stated Goal: to go home OT Goal Formulation: With patient Time For Goal Achievement: 11/26/22 Potential to Achieve Goals: Good  Plan Discharge plan remains appropriate;Frequency needs to be updated    Co-evaluation                 AM-PAC OT "6 Clicks" Daily Activity     Outcome Measure   Help from another person eating meals?: None Help from another person taking care of personal grooming?: None Help from another person toileting, which includes using toliet, bedpan, or urinal?: A Little Help from another person bathing (including washing, rinsing, drying)?: A Little Help from another person to put on and taking off regular upper body clothing?: None Help from another person to put on and taking off regular lower body clothing?: A Lot 6 Click Score: 20    End of Session Equipment Utilized During Treatment: Rolling walker (2 wheels)  OT Visit Diagnosis: Muscle weakness (generalized) (M62.81);Unsteadiness on feet (R26.81)   Activity Tolerance Patient tolerated treatment well;Patient limited by fatigue   Patient Left in chair;with call bell/phone within reach   Nurse Communication Mobility status        Time: 1610-9604 OT Time Calculation (min): 20 min  Charges: OT General Charges $OT Visit: 1 Visit OT Treatments $Self Care/Home Management : 8-22 mins  [facility] MS, OTR/L ascom [phone]  11/13/22, 1:37 PM

## 2022-11-13 NOTE — Progress Notes (Signed)
[facility] Progress Note  Patient: Melissa Christian    CBJ:[id number]  DOA: 11/10/2022     Date of Service: the patient was seen and examined on 11/13/2022  Chief Complaint  Patient presents with   Shortness of Breath   Brief hospital course:  HPI: Melissa Christian is a 82 y.o. female with medical history significant for morbid obesity, CAD, diabetes mellitus, stage IV CKD and hypertension, with chronic iron deficiency anemia due to suspected occult chronic GI blood loss but with negative extensive GI workup  in December 2023 during a hospitalization for acute blood loss anemia requiring 2 units PRBCs for hemoglobin of 6.7 (negative EGD, colonoscopy and capsule endoscopy), and who is currently on outpatient iron transfusions, who was sent from the hematology office for admission for symptomatic anemia.  Patient reportedly had an unexpected drop in her hemoglobin to 7.7 from 8.9 two weeks prior and additionally was symptomatic for dyspnea on exertion and weakness.  She reported having black stool, similar to when she was hospitalized back in December.  She denies chest pain.She received an iron transfusion prior to being sent to the ED.   ED course and data review: Vitals initially within normal limits though blood pressure was a bit soft at the time of admission at 117/42.  Hemoglobin 8.2, up from 7.7 earlier in the day at the hematology clinic.  Troponin 20.  Creatinine 2.8, up from her baseline of 1.37 and BUN 59. Stool guaiac positive. EKG, personally viewed and interpreted showing sinus at 70 with nonspecific ST-T wave changes. Chest x-ray showing cardiomegaly with trace pleural effusions. Patient was started on a Protonix infusion and a unit of PRBCs was ordered. Hospitalist consulted for admission.   Assessment and Plan:  Symptomatic anemia secondary to iron deficiency and CKD Transferrin saturation 10% on 10/26/2022, patient received Venofer IV infusion on 11/10/2022.  Patient is  following hematology as an outpatient. 5/9 1 unit PRBC transfusion given.  Posttransfusion Hb 8.5 Trend H&H and transfuse if hemoglobin less than 7 GI consulted, recommended to follow-up with hematologist, no intervention at this time. As per GI anemia less likely due to GI bleed, continue PPI. Nephrology consulted for possible Epogen due to CKD, could be causing anemia 5/11 8.1, stable continue to monitor   Chronic diastolic CHF exacerbation Anasarca, patient has significant bilateral lower extremity edema 08/24/2022 TTE shows LVEF 60 to 65%, grade 2 diastolic dysfunction.  Mild to mild valvular abnormality.  Discontinued oral Lasix Started Lasix IV infusion Monitor renal functions and daily body weight Follow nephrology for further recommendation   AKI on CKD stage IIIb/IV Baseline creatinine 1.37-1.9, eGFR 39--25 Creatinine 2.8 on admission Creatinine 2.57--2.51, continue to monitor renal functions Avoid nephrotoxic medications Nephrology consulted  # Hypokalemia, potassium repleted. Monitor electrolytes and replete as needed.   CAD, HTN, HLD Mildly elevated troponin most likely due to demand ischemia, patient denied any chest pain Blood pressure is soft, continue to monitor Continue Crestor 40 mg p.o. daily home dose Held aspirin due to symptomatic anemia Held metoprolol, triamterene-hydrochlorothiazide, and Cardura due to hypotension 5/10 started midodrine 5 mg p.o. 3 times daily with holding parameters 5/11 BP improved, changed to midodrine to prn   IDDM T2 Patient is on Trulicity and Actos at home which has been held for now Started NovoLog sliding scale, monitor CBG, continue diabetic diet   Abnormal TSH, TSH level 4.9 slightly elevated, free T4 within normal range 1.1 Follow with PCP to repeat TFTs after 4 to 6  weeks.   There is no height or weight on file to calculate BMI.  Interventions:     Diet: Diabetic diet DVT Prophylaxis: SCD, pharmacological  prophylaxis contraindicated due to symptomatic anemia    Advance goals of care discussion: Full code  Family Communication: family was not present at bedside, at the time of interview.  The pt provided permission to discuss medical plan with the family. Opportunity was given to ask question and all questions were answered satisfactorily.   Disposition:  Pt is from Home, admitted with symptomatic anemia, found to have anasarca, lower extremity edema, started Lasix IV infusion, nephrology consulted, which precludes a safe discharge. Discharge to home with [facility] vs SNF TBD after PT/OT eval, when clinically stable.  Subjective: No significant events overnight, patient feels improvement in the lower extremity edema, still has significant edema in the upper thighs.  Denies any chest pain or palpitation no shortness of breath.  No any other active issues.   Physical Exam: General: NAD, lying comfortably Appear in no distress, affect appropriate Eyes: PERRLA ENT: Oral Mucosa Clear, moist  Neck: no JVD,  Cardiovascular: S1 and S2 Present, no Murmur,  Respiratory: good respiratory effort, Bilateral Air entry equal and Decreased, no Crackles, occasional wheezes Abdomen: Bowel Sound present, Soft, obese and no tenderness, mild edema of abdominal wall Skin: no rashes Extremities: 2-3+ Pedal edema, no calf tenderness.  Edema is improving Neurologic: without any new focal findings Gait not checked due to patient safety concerns  Vitals:   11/12/22 1537 11/12/22 1946 11/13/22 0438 11/13/22 0810  BP: (!) 134/43 (!) 127/112 (!) 102/91 (!) 127/53  Pulse: 73 77 78 76  Resp:  20 18   Temp: 98.3 F (36.8 C) 98.2 F (36.8 C) 98.2 F (36.8 C) 97.7 F (36.5 C)  TempSrc: Oral Oral Oral Oral  SpO2: 100% 98% 96% 96%  Weight:  128.5 kg 127.9 kg   Height:  5\' 4"  (1.626 m)      Intake/Output Summary (Last 24 hours) at 11/13/2022 1542 Last data filed at 11/13/2022 1517 Gross per 24 hour  Intake 745.71 ml   Output 2050 ml  Net -1304.29 ml   Filed Weights   11/12/22 1946 11/13/22 0438  Weight: 128.5 kg 127.9 kg    Data Reviewed: I have personally reviewed and interpreted daily labs, tele strips, imagings as discussed above. I reviewed all nursing notes, pharmacy notes, vitals, pertinent old records I have discussed plan of care as described above with RN and patient/family.  CBC: Recent Labs  Lab 11/10/22 1456 11/10/22 1634 11/11/22 0500 11/12/22 0354 11/13/22 0530  WBC 4.6 4.7 4.8 4.5 4.7  NEUTROABS  --  3.9  --   --   --   HGB 7.7* 8.2* 8.5* 8.1* 8.0*  HCT 26.0* 28.2* 28.5* 27.6* 26.6*  MCV 101.6* 102.5* 99.3 101.8* 99.3  PLT 188 210 195 179 180   Basic Metabolic Panel: Recent Labs  Lab 11/10/22 1634 11/11/22 0500 11/12/22 0354 11/13/22 0530  NA 137 139 138 137  K 3.8 3.9 3.8 3.4*  CL 100 106 105 102  CO2 26 26 25 28   GLUCOSE 183* 117* 88 119*  BUN 59* 53* 53* 55*  CREATININE 2.80* 2.57* 2.55* 2.51*  CALCIUM 8.6* 8.3* 8.3* 8.3*  MG  --   --  2.6* 2.5*  PHOS  --   --  4.0 3.7    Studies: No results found.  Scheduled Meds:  cholecalciferol  2,000 Units Oral Daily   epoetin (  EPOGEN/PROCRIT) injection  20,000 Units Subcutaneous Weekly   insulin aspart  0-20 Units Subcutaneous Q4H   polyethylene glycol  17 g Oral Daily   potassium chloride  40 mEq Oral Once   psyllium  1 packet Oral Daily   rosuvastatin  40 mg Oral Daily   Continuous Infusions:  furosemide (LASIX) 200 mg in dextrose 5 % 100 mL (2 mg/mL) infusion 4 mg/hr (11/13/22 1058)   PRN Meds: acetaminophen **OR** acetaminophen, ipratropium-albuterol, midodrine, ondansetron **OR** ondansetron (ZOFRAN) IV  Time spent: 35 minutes  Author: Gillis Santa. MD [facility] 11/13/2022 3:42 PM  To reach On-call, see care teams to locate the attending and reach out to them via [email or web address]. If 7PM-7AM, please contact night-coverage If you still have difficulty reaching the attending provider, please  page the [facility] (Director on Call) for [facility] on amion for assistance.

## 2022-11-14 ENCOUNTER — Other Ambulatory Visit (HOSPITAL_COMMUNITY): Payer: Self-pay

## 2022-11-14 ENCOUNTER — Telehealth (HOSPITAL_COMMUNITY): Payer: Self-pay | Admitting: Pharmacy Technician

## 2022-11-14 ENCOUNTER — Encounter: Payer: Self-pay | Admitting: Oncology

## 2022-11-14 DIAGNOSIS — D62 Acute posthemorrhagic anemia: Secondary | ICD-10-CM | POA: Diagnosis not present

## 2022-11-14 LAB — PHOSPHORUS: Phosphorus: 3.1 mg/dL (ref 2.5–4.6)

## 2022-11-14 LAB — BASIC METABOLIC PANEL
Anion gap: 9 (ref 5–15)
BUN: 49 mg/dL — ABNORMAL HIGH (ref 8–23)
CO2: 29 mmol/L (ref 22–32)
Calcium: 8.3 mg/dL — ABNORMAL LOW (ref 8.9–10.3)
Chloride: 99 mmol/L (ref 98–111)
Creatinine, Ser: 2.25 mg/dL — ABNORMAL HIGH (ref 0.44–1.00)
GFR, Estimated: 21 mL/min — ABNORMAL LOW (ref >60)
Glucose, Bld: 92 mg/dL (ref 70–99)
Potassium: 3.7 mmol/L (ref 3.5–5.1)
Sodium: 137 mmol/L (ref 135–145)

## 2022-11-14 LAB — CBC
HCT: 28.7 % — ABNORMAL LOW (ref 36.0–46.0)
Hemoglobin: 8.9 g/dL — ABNORMAL LOW (ref 12.0–15.0)
MCH: 30.6 pg (ref 26.0–34.0)
MCHC: 31 g/dL (ref 30.0–36.0)
MCV: 98.6 fL (ref 80.0–100.0)
Platelets: 203 10*3/uL (ref 150–400)
RBC: 2.91 MIL/uL — ABNORMAL LOW (ref 3.87–5.11)
RDW: 16.8 % — ABNORMAL HIGH (ref 11.5–15.5)
WBC: 5 10*3/uL (ref 4.0–10.5)
nRBC: 0 % (ref 0.0–0.2)

## 2022-11-14 LAB — GLUCOSE, CAPILLARY
Glucose-Capillary: 101 mg/dL — ABNORMAL HIGH (ref 70–99)
Glucose-Capillary: 115 mg/dL — ABNORMAL HIGH (ref 70–99)
Glucose-Capillary: 139 mg/dL — ABNORMAL HIGH (ref 70–99)
Glucose-Capillary: 157 mg/dL — ABNORMAL HIGH (ref 70–99)
Glucose-Capillary: 88 mg/dL (ref 70–99)
Glucose-Capillary: 93 mg/dL (ref 70–99)

## 2022-11-14 LAB — MAGNESIUM: Magnesium: 2.3 mg/dL (ref 1.7–2.4)

## 2022-11-14 MED ORDER — ORAL CARE MOUTH RINSE: 15.0000 mL | OROMUCOSAL | Status: DC | PRN

## 2022-11-14 MED ORDER — INSULIN ASPART 100 UNIT/ML IJ SOLN
0.0000 [IU] | Freq: Three times a day (TID) | INTRAMUSCULAR | Status: DC
  Administered 2022-11-14: 4 [IU] via SUBCUTANEOUS
  Filled 2022-11-14: qty 1

## 2022-11-14 MED ORDER — FUROSEMIDE 40 MG PO TABS
40.0000 mg | ORAL_TABLET | Freq: Every day | ORAL | Status: DC
  Administered 2022-11-14 – 2022-11-15 (×2): 40 mg via ORAL
  Filled 2022-11-14: qty 1

## 2022-11-14 MED ORDER — SPIRONOLACTONE 25 MG PO TABS
25.0000 mg | ORAL_TABLET | Freq: Every day | ORAL | Status: DC
  Administered 2022-11-14 – 2022-11-15 (×2): 25 mg via ORAL
  Filled 2022-11-14 (×2): qty 1

## 2022-11-14 MED ORDER — MIDODRINE HCL 5 MG PO TABS
5.0000 mg | ORAL_TABLET | Freq: Three times a day (TID) | ORAL | Status: DC
  Administered 2022-11-14 – 2022-11-15 (×2): 5 mg via ORAL
  Filled 2022-11-14 (×2): qty 1

## 2022-11-14 NOTE — TOC Benefit Eligibility Note (Signed)
Patient Product/process development scientist completed.    The patient is currently admitted and upon discharge could be taking Jardiance 10 mg.  The current 30 day co-pay is $11.20.   The patient is insured through [employer] part [employer]   This test claim was processed through [facility]- copay amounts may vary at other pharmacies due to pharmacy/plan contracts, or as the patient moves through the different stages of their insurance plan.  Roland Earl, CPHT Pharmacy Patient Advocate Specialist [facility] Patient Advocate Team Direct Number: [phone]  Fax: [phone]

## 2022-11-14 NOTE — Progress Notes (Signed)
[facility] Progress Note  Patient: Melissa Christian    ZOX:[id number]  DOA: 11/10/2022     Date of Service: the patient was seen and examined on 11/14/2022  Chief Complaint  Patient presents with   Shortness of Breath   Brief hospital course:  HPI: Melissa Christian is a 82 y.o. female with medical history significant for morbid obesity, CAD, diabetes mellitus, stage IV CKD and hypertension, with chronic iron deficiency anemia due to suspected occult chronic GI blood loss but with negative extensive GI workup  in December 2023 during a hospitalization for acute blood loss anemia requiring 2 units PRBCs for hemoglobin of 6.7 (negative EGD, colonoscopy and capsule endoscopy), and who is currently on outpatient iron transfusions, who was sent from the hematology office for admission for symptomatic anemia.  Patient reportedly had an unexpected drop in her hemoglobin to 7.7 from 8.9 two weeks prior and additionally was symptomatic for dyspnea on exertion and weakness.  She reported having black stool, similar to when she was hospitalized back in December.  She denies chest pain.She received an iron transfusion prior to being sent to the ED.   ED course and data review: Vitals initially within normal limits though blood pressure was a bit soft at the time of admission at 117/42.  Hemoglobin 8.2, up from 7.7 earlier in the day at the hematology clinic.  Troponin 20.  Creatinine 2.8, up from her baseline of 1.37 and BUN 59. Stool guaiac positive. EKG, personally viewed and interpreted showing sinus at 70 with nonspecific ST-T wave changes. Chest x-ray showing cardiomegaly with trace pleural effusions. Patient was started on a Protonix infusion and a unit of PRBCs was ordered. Hospitalist consulted for admission.   Assessment and Plan:  Symptomatic anemia secondary to iron deficiency and CKD Transferrin saturation 10% on 10/26/2022, patient received Venofer IV infusion on 11/10/2022.  Patient is  following hematology as an outpatient. 5/9 1 unit PRBC transfusion given.  Posttransfusion Hb 8.5 Trend H&H and transfuse if hemoglobin less than 7 GI consulted, recommended to follow-up with hematologist, no intervention at this time. As per GI anemia less likely due to GI bleed, continue PPI. Nephrology consulted for possible Epogen due to CKD, could be causing anemia 5/13 8.9, stable continue to monitor   Chronic diastolic CHF exacerbation Anasarca, patient has significant bilateral lower extremity edema 08/24/2022 TTE shows LVEF 60 to 65%, grade 2 diastolic dysfunction.  Mild to mild valvular abnormality.  Discontinued oral Lasix S/p Lasix IV infusion, on 5/13 started Lasix 40 mg p.o. daily and Aldactone 25 mg p.o. daily Monitor renal functions and daily body weight Follow nephrology for further recommendation   AKI on CKD stage IIIb/IV Baseline creatinine 1.37-1.9, eGFR 39--25 Creatinine 2.8 on admission Creatinine 2.57--2.25 gradually improving  continue to monitor renal functions Avoid nephrotoxic medications Nephrology consulted  # Hypokalemia, potassium repleted.  Resolved Monitor electrolytes and replete as needed.   CAD, HTN, HLD Mildly elevated troponin most likely due to demand ischemia, patient denied any chest pain Blood pressure is soft, continue to monitor Continue Crestor 40 mg p.o. daily home dose Held aspirin due to symptomatic anemia Held metoprolol, triamterene-hydrochlorothiazide, and Cardura due to hypotension 5/10 started midodrine 5 mg p.o. 3 times daily with holding parameters 5/11 BP improved, changed to midodrine to prn   IDDM T2 Patient is on Trulicity and Actos at home which has been held for now Started NovoLog sliding scale, monitor CBG, continue diabetic diet   Abnormal TSH, TSH level  4.9 slightly elevated, free T4 within normal range 1.1 Follow with PCP to repeat TFTs after 4 to 6 weeks.   There is no height or weight on file to  calculate BMI.  Interventions:     Diet: Diabetic diet DVT Prophylaxis: SCD, pharmacological prophylaxis contraindicated due to symptomatic anemia    Advance goals of care discussion: Full code  Family Communication: family was not present at bedside, at the time of interview.  The pt provided permission to discuss medical plan with the family. Opportunity was given to ask question and all questions were answered satisfactorily.   Disposition:  Pt is from Home, admitted with symptomatic anemia, found to have anasarca, lower extremity edema, s/p Lasix IV infusion, nephrology consulted, which precludes a safe discharge. Discharge to home with [facility], when clinically stable.  Most likely discharge tomorrow a.m.  Subjective: No significant events overnight, patient feels improvement in the lower extremity edema but is still feels that he has fluid in her thighs.  Able to ambulate with help.  Denies any worsening of shortness of breath, no chest pain palpitation.  Patient seems to be improving.  We are planning to discharge her tomorrow a.m., patient agreed with the plan.   Physical Exam: General: NAD, lying comfortably Appear in no distress, affect appropriate Eyes: PERRLA ENT: Oral Mucosa Clear, moist  Neck: no JVD,  Cardiovascular: S1 and S2 Present, no Murmur,  Respiratory: good respiratory effort, Bilateral Air entry equal and Decreased, no Crackles, occasional wheezes Abdomen: Bowel Sound present, Soft, obese and no tenderness, mild edema of abdominal wall Skin: no rashes Extremities: 2-3+ Pedal edema, no calf tenderness.  Edema is improving Neurologic: without any new focal findings Gait not checked due to patient safety concerns  Vitals:   11/14/22 0357 11/14/22 0402 11/14/22 0742 11/14/22 1142  BP: (!) 116/52  95/74 (!) 127/50  Pulse: 87  87 88  Resp: 18  16 18   Temp: 98.5 F (36.9 C)  97.8 F (36.6 C) 98.2 F (36.8 C)  TempSrc:   Oral Oral  SpO2: 92%  94% 93%  Weight:   123.4 kg    Height:        Intake/Output Summary (Last 24 hours) at 11/14/2022 1453 Last data filed at 11/14/2022 1143 Gross per 24 hour  Intake 524.49 ml  Output 3900 ml  Net -3375.51 ml   Filed Weights   11/12/22 1946 11/13/22 0438 11/14/22 0402  Weight: 128.5 kg 127.9 kg 123.4 kg    Data Reviewed: I have personally reviewed and interpreted daily labs, tele strips, imagings as discussed above. I reviewed all nursing notes, pharmacy notes, vitals, pertinent old records I have discussed plan of care as described above with RN and patient/family.  CBC: Recent Labs  Lab 11/10/22 1634 11/11/22 0500 11/12/22 0354 11/13/22 0530 11/14/22 0425  WBC 4.7 4.8 4.5 4.7 5.0  NEUTROABS 3.9  --   --   --   --   HGB 8.2* 8.5* 8.1* 8.0* 8.9*  HCT 28.2* 28.5* 27.6* 26.6* 28.7*  MCV 102.5* 99.3 101.8* 99.3 98.6  PLT 210 195 179 180 203   Basic Metabolic Panel: Recent Labs  Lab 11/10/22 1634 11/11/22 0500 11/12/22 0354 11/13/22 0530 11/14/22 0425  NA 137 139 138 137 137  K 3.8 3.9 3.8 3.4* 3.7  CL 100 106 105 102 99  CO2 26 26 25 28 29   GLUCOSE 183* 117* 88 119* 92  BUN 59* 53* 53* 55* 49*  CREATININE 2.80* 2.57* 2.55* 2.51*  2.25*  CALCIUM 8.6* 8.3* 8.3* 8.3* 8.3*  MG  --   --  2.6* 2.5* 2.3  PHOS  --   --  4.0 3.7 3.1    Studies: No results found.  Scheduled Meds:  cholecalciferol  2,000 Units Oral Daily   epoetin (EPOGEN/PROCRIT) injection  20,000 Units Subcutaneous Weekly   furosemide  40 mg Oral Daily   insulin aspart  0-20 Units Subcutaneous TID AC & HS   midodrine  5 mg Oral TID WC   polyethylene glycol  17 g Oral Daily   psyllium  1 packet Oral Daily   rosuvastatin  40 mg Oral Daily   spironolactone  25 mg Oral Daily   Continuous Infusions:   PRN Meds: acetaminophen **OR** acetaminophen, ipratropium-albuterol, ondansetron **OR** ondansetron (ZOFRAN) IV, mouth rinse  Time spent: 35 minutes  Author: Gillis Santa. MD [facility] 11/14/2022 2:53  PM  To reach On-call, see care teams to locate the attending and reach out to them via [email or web address]. If 7PM-7AM, please contact night-coverage If you still have difficulty reaching the attending provider, please page the [facility] (Director on Call) for [facility] on amion for assistance.

## 2022-11-14 NOTE — Progress Notes (Signed)
[facility]  PROGRESS NOTE   Subjective:   UOP .  Creatinine 2.25 (2.51)  Furosemide gtt.   Objective:  Vital signs: Blood pressure (!) 140/52, pulse 84, temperature 97.7 F (36.5 C), resp. rate 16, height 5\' 4"  (1.626 m), weight 123.4 kg, SpO2 96 %.  Intake/Output Summary (Last 24 hours) at 11/14/2022 1740 Last data filed at 11/14/2022 1143 Gross per 24 hour  Intake 259.29 ml  Output 3250 ml  Net -2990.71 ml    Filed Weights   11/12/22 1946 11/13/22 0438 11/14/22 0402  Weight: 128.5 kg 127.9 kg 123.4 kg     Physical Exam: General:  No acute distress  Head:  Normocephalic, atraumatic. Moist oral mucosal membranes  Eyes:  Anicteric  Neck:  Supple  Lungs:   Clear to auscultation, normal effort  Heart:  regular  Abdomen:   Soft, nontender, bowel sounds present  Extremities:  Trace peripheral edema.  Neurologic:  Awake, alert, following commands  Skin:  No lesions       Basic Metabolic Panel: Recent Labs  Lab 11/10/22 1634 11/11/22 0500 11/12/22 0354 11/13/22 0530 11/14/22 0425  NA 137 139 138 137 137  K 3.8 3.9 3.8 3.4* 3.7  CL 100 106 105 102 99  CO2 26 26 25 28 29   GLUCOSE 183* 117* 88 119* 92  BUN 59* 53* 53* 55* 49*  CREATININE 2.80* 2.57* 2.55* 2.51* 2.25*  CALCIUM 8.6* 8.3* 8.3* 8.3* 8.3*  MG  --   --  2.6* 2.5* 2.3  PHOS  --   --  4.0 3.7 3.1    GFR: Estimated Creatinine Clearance: 25 mL/min (A) (by C-G formula based on SCr of 2.25 mg/dL (H)).  Liver Function Tests: Recent Labs  Lab 11/10/22 1634  AST 30  ALT 13  ALKPHOS 96  BILITOT 0.8  PROT 7.2  ALBUMIN 3.7    No results for input(s): "LIPASE", "AMYLASE" in the last 168 hours. No results for input(s): "AMMONIA" in the last 168 hours.  CBC: Recent Labs  Lab 11/10/22 1634 11/11/22 0500 11/12/22 0354 11/13/22 0530 11/14/22 0425  WBC 4.7 4.8 4.5 4.7 5.0  NEUTROABS 3.9  --   --   --   --   HGB 8.2* 8.5* 8.1* 8.0* 8.9*  HCT 28.2* 28.5* 27.6* 26.6* 28.7*   MCV 102.5* 99.3 101.8* 99.3 98.6  PLT 210 195 179 180 203      HbA1C: Hgb A1c MFr Bld  Date/Time Value Ref Range Status  06/30/2022 03:19 AM 5.7 (H) 4.8 - 5.6 % Final    Comment:    (NOTE)         Prediabetes: 5.7 - 6.4         Diabetes: >6.4         Glycemic control for adults with diabetes: <7.0   09/23/2015 06:26 AM 8.8 (H) 4.0 - 6.0 % Final    Urinalysis: No results for input(s): "COLORURINE", "LABSPEC", "PHURINE", "GLUCOSEU", "HGBUR", "BILIRUBINUR", "KETONESUR", "PROTEINUR", "UROBILINOGEN", "NITRITE", "LEUKOCYTESUR" in the last 72 hours.  Invalid input(s): "APPERANCEUR"     Imaging: No results found.   Medications:      cholecalciferol  2,000 Units Oral Daily   epoetin (EPOGEN/PROCRIT) injection  20,000 Units Subcutaneous Weekly   furosemide  40 mg Oral Daily   insulin aspart  0-20 Units Subcutaneous TID AC & HS   midodrine  5 mg Oral TID WC   polyethylene glycol  17 g Oral Daily   psyllium  1 packet Oral  Daily   rosuvastatin  40 mg Oral Daily   spironolactone  25 mg Oral Daily    Assessment/ Plan:     82 y.o.  female with past medical conditions including diabetes, obesity, CAD, hypertension, and chronic kidney disease., who was admitted to [facility] on 11/10/2022 for ABLA (acute blood loss anemia) [D62]    #1: Acute kidney injury: on chronic kidney disease stage IV: baseline creatinine of 2.1, GFR of 23 on 09/20/22. Previously followed with Dr. Cherylann Ratel.  - acute kidney injury secondary to acute cardiorenal syndrome.  -chronic kidney disease secondary to diabetic nephropathy - holding triamterene and hydrochlorothiazide. Do not recommend restarting.    #2: Acute exacerbation of chronic diastolic congestive heart failure: Transition of furosemide gtt and start PO furosemide and PO spironolactone.    #3: Diabetes mellitus type II with chronic kidney disease: noninsulin dependent. On pioglitazone as outpatient. Hemoglobin A1c of 5.8% on 09/01/22.    #4: Anemia  with chronic kidney disease: hemoglobin 8.9 with iron deficiency.    LOS: 4 Lamont Dowdy, MD [facility] 5/13/20245:40 PM

## 2022-11-14 NOTE — Plan of Care (Signed)
Patient is participating in goals of care to meet goals for discharge.  Daune Colgate S Tymber Stallings, RN    Problem: Education: Goal: Knowledge of General Education information will improve Description: Including pain rating scale, medication(s)/side effects and non-pharmacologic comfort measures Outcome: Progressing   Problem: Health Behavior/Discharge Planning: Goal: Ability to manage health-related needs will improve Outcome: Progressing   Problem: Clinical Measurements: Goal: Ability to maintain clinical measurements within normal limits will improve Outcome: Progressing Goal: Will remain free from infection Outcome: Progressing Goal: Diagnostic test results will improve Outcome: Progressing Goal: Respiratory complications will improve Outcome: Progressing Goal: Cardiovascular complication will be avoided Outcome: Progressing   Problem: Activity: Goal: Risk for activity intolerance will decrease Outcome: Progressing   Problem: Nutrition: Goal: Adequate nutrition will be maintained Outcome: Progressing   Problem: Coping: Goal: Level of anxiety will decrease Outcome: Progressing   Problem: Elimination: Goal: Will not experience complications related to bowel motility Outcome: Progressing Goal: Will not experience complications related to urinary retention Outcome: Progressing   Problem: Pain Managment: Goal: General experience of comfort will improve Outcome: Progressing   Problem: Safety: Goal: Ability to remain free from injury will improve Outcome: Progressing   Problem: Skin Integrity: Goal: Risk for impaired skin integrity will decrease Outcome: Progressing   

## 2022-11-14 NOTE — Progress Notes (Signed)
Physical Therapy Treatment Patient Details Name: Melissa Christian MRN: [id number] DOB: [birth year] Today's Date: 11/14/2022   History of Present Illness Pt is an 82 y/o female admitted for anemia, CHF exacerbation, and AKI. Pt with history that includes DM, obesity, CAD, HTN, and CKD.    PT Comments    Pt was pleasant and motivated to participate during the session and put forth good effort throughout. Pt found on room air with SpO2 97%.  Pt able to amb 80 feet on room air with slow cadence and short bilateral step length but was steady without LOB.  Pt's SpO2 dropped to a low of 85% during ambulation and increased back to >/= 88% in 30 sec and ultimately returned to the upper 90s.  Pt then able to amb 40 feet on 1LO2/min with SpO2 dropping to a low of 92% with distance limited to instance of urinary incontinence in the hallway.  Pt left in room with nursing at end of session for hygiene.  Pt will benefit from continued PT services upon discharge to safely address deficits listed in patient problem list for decreased caregiver assistance and eventual return to PLOF.     Recommendations for follow up therapy are one component of a multi-disciplinary discharge planning process, led by the attending physician.  Recommendations may be updated based on patient status, additional functional criteria and insurance authorization.  Follow Up Recommendations       Assistance Recommended at Discharge Intermittent Supervision/Assistance  Patient can return home with the following A little help with walking and/or transfers;Assist for transportation;Help with stairs or ramp for entrance   Equipment Recommendations  None recommended by PT    Recommendations for Other Services       Precautions / Restrictions Precautions Precautions: Fall Restrictions Weight Bearing Restrictions: No Other Position/Activity Restrictions: watch SpO2     Mobility  Bed Mobility               General bed  mobility comments: NT, pt in recliner    Transfers Overall transfer level: Needs assistance Equipment used: Rolling walker (2 wheels) Transfers: Sit to/from Stand Sit to Stand: Min guard           General transfer comment: Min verbal cues for hand placement    Ambulation/Gait Ambulation/Gait assistance: Min guard Gait Distance (Feet): 80 Feet x 1, 40 Feet x 1 Assistive device: Rolling walker (2 wheels) Gait Pattern/deviations: Step-through pattern, Decreased step length - right, Decreased step length - left Gait velocity: decreased     General Gait Details: Slow cadence and short B step length but steady with no overt LOB with a RW   Stairs             Wheelchair Mobility    Modified Rankin (Stroke Patients Only)       Balance Overall balance assessment: Needs assistance   Sitting balance-Leahy Scale: Good     Standing balance support: During functional activity, Bilateral upper extremity supported Standing balance-Leahy Scale: Good                              Cognition Arousal/Alertness: Awake/alert Behavior During Therapy: WFL for tasks assessed/performed Overall Cognitive Status: Within Functional Limits for tasks assessed  Exercises Other Exercises Other Exercises: PLB education    General Comments        Pertinent Vitals/Pain Pain Assessment Pain Assessment: No/denies pain    Home Living                          Prior Function            PT Goals (current goals can now be found in the care plan section) Progress towards PT goals: Progressing toward goals    Frequency    Min 2X/week      PT Plan Current plan remains appropriate    Co-evaluation              AM-PAC PT "6 Clicks" Mobility   Outcome Measure  Help needed turning from your back to your side while in a flat bed without using bedrails?: None Help needed moving from lying  on your back to sitting on the side of a flat bed without using bedrails?: A Little Help needed moving to and from a bed to a chair (including a wheelchair)?: A Little Help needed standing up from a chair using your arms (e.g., wheelchair or bedside chair)?: A Little Help needed to walk in hospital room?: A Little Help needed climbing 3-5 steps with a railing? : A Little 6 Click Score: 19    End of Session Equipment Utilized During Treatment: Gait belt;Oxygen Activity Tolerance: Patient tolerated treatment well Patient left: in chair;with call bell/phone within reach;with chair alarm set;with family/visitor present;with nursing/sitter in room Nurse Communication: Mobility status PT Visit Diagnosis: Muscle weakness (generalized) (M62.81);Difficulty in walking, not elsewhere classified (R26.2)     Time: 6045-4098 PT Time Calculation (min) (ACUTE ONLY): 33 min  Charges:  $Gait Training: 8-22 mins $Therapeutic Activity: 8-22 mins                     D. Scott Braleigh Massoud PT, DPT 11/14/22, 3:52 PM

## 2022-11-14 NOTE — Telephone Encounter (Signed)
Pharmacy Patient Advocate Encounter  Insurance verification completed.    The patient is insured through [employer] Part D   The patient is currently admitted and ran test claims for the following: Jardiance.  Copays and coinsurance results were relayed to Inpatient clinical team.  

## 2022-11-14 NOTE — Care Management Important Message (Signed)
Important Message  Patient Details  Name: Melissa Christian MRN: [id number] Date of Birth: 1940/11/25   Medicare Important Message Given:  Yes     Olegario Messier A Gardiner Espana 11/14/2022, 2:17 PM

## 2022-11-15 DIAGNOSIS — D62 Acute posthemorrhagic anemia: Secondary | ICD-10-CM | POA: Diagnosis not present

## 2022-11-15 LAB — CBC
HCT: 29.3 % — ABNORMAL LOW (ref 36.0–46.0)
Hemoglobin: 8.9 g/dL — ABNORMAL LOW (ref 12.0–15.0)
MCH: 30.3 pg (ref 26.0–34.0)
MCHC: 30.4 g/dL (ref 30.0–36.0)
MCV: 99.7 fL (ref 80.0–100.0)
Platelets: 210 10*3/uL (ref 150–400)
RBC: 2.94 MIL/uL — ABNORMAL LOW (ref 3.87–5.11)
RDW: 16.6 % — ABNORMAL HIGH (ref 11.5–15.5)
WBC: 4.9 10*3/uL (ref 4.0–10.5)
nRBC: 0 % (ref 0.0–0.2)

## 2022-11-15 LAB — GLUCOSE, CAPILLARY
Glucose-Capillary: 129 mg/dL — ABNORMAL HIGH (ref 70–99)
Glucose-Capillary: 156 mg/dL — ABNORMAL HIGH (ref 70–99)
Glucose-Capillary: 90 mg/dL (ref 70–99)
Glucose-Capillary: 93 mg/dL (ref 70–99)

## 2022-11-15 LAB — TYPE AND SCREEN
Antibody Screen: POSITIVE
Unit division: 0
Unit division: 0

## 2022-11-15 LAB — PHOSPHORUS: Phosphorus: 3.6 mg/dL (ref 2.5–4.6)

## 2022-11-15 LAB — BASIC METABOLIC PANEL
Anion gap: 7 (ref 5–15)
BUN: 48 mg/dL — ABNORMAL HIGH (ref 8–23)
CO2: 33 mmol/L — ABNORMAL HIGH (ref 22–32)
Calcium: 8.3 mg/dL — ABNORMAL LOW (ref 8.9–10.3)
Chloride: 99 mmol/L (ref 98–111)
Creatinine, Ser: 2.3 mg/dL — ABNORMAL HIGH (ref 0.44–1.00)
GFR, Estimated: 21 mL/min — ABNORMAL LOW (ref >60)
Glucose, Bld: 102 mg/dL — ABNORMAL HIGH (ref 70–99)
Potassium: 3.7 mmol/L (ref 3.5–5.1)
Sodium: 139 mmol/L (ref 135–145)

## 2022-11-15 LAB — BPAM RBC
Blood Product Expiration Date: 202405242359
ISSUE DATE / TIME: 202405092318
Unit Type and Rh: 6200

## 2022-11-15 LAB — MAGNESIUM: Magnesium: 2.4 mg/dL (ref 1.7–2.4)

## 2022-11-15 MED ORDER — MIDODRINE HCL 5 MG PO TABS: 5.0000 mg | ORAL_TABLET | Freq: Three times a day (TID) | ORAL | 2 refills | Status: AC

## 2022-11-15 MED ORDER — SPIRONOLACTONE 25 MG PO TABS: 25.0000 mg | ORAL_TABLET | Freq: Every day | ORAL | 5 refills | Status: AC

## 2022-11-15 NOTE — Progress Notes (Signed)
[facility]  PROGRESS NOTE   Subjective:   Taken off furosemide gtt. Started on PO furosemide and spironolactone.   UOP 3400  Creatinine 2.3 (2.25)  Objective:  Vital signs: Blood pressure (!) 100/43, pulse 92, temperature 98.2 F (36.8 C), resp. rate 18, height 5\' 4"  (1.626 m), weight 120 kg, SpO2 91 %.  Intake/Output Summary (Last 24 hours) at 11/15/2022 1404 Last data filed at 11/15/2022 0033 Gross per 24 hour  Intake 180 ml  Output 2150 ml  Net -1970 ml    Filed Weights   11/13/22 0438 11/14/22 0402 11/15/22 0406  Weight: 127.9 kg 123.4 kg 120 kg     Physical Exam: General:  No acute distress  Head:  Normocephalic, atraumatic. Moist oral mucosal membranes  Eyes:  Anicteric  Neck:  Supple  Lungs:   Clear to auscultation, normal effort  Heart:  regular  Abdomen:   Soft, nontender, bowel sounds present  Extremities:  Trace peripheral edema.  Neurologic:  Awake, alert, following commands  Skin:  No lesions       Basic Metabolic Panel: Recent Labs  Lab 11/11/22 0500 11/12/22 0354 11/13/22 0530 11/14/22 0425 11/15/22 0411  NA 139 138 137 137 139  K 3.9 3.8 3.4* 3.7 3.7  CL 106 105 102 99 99  CO2 26 25 28 29  33*  GLUCOSE 117* 88 119* 92 102*  BUN 53* 53* 55* 49* 48*  CREATININE 2.57* 2.55* 2.51* 2.25* 2.30*  CALCIUM 8.3* 8.3* 8.3* 8.3* 8.3*  MG  --  2.6* 2.5* 2.3 2.4  PHOS  --  4.0 3.7 3.1 3.6    GFR: Estimated Creatinine Clearance: 24.1 mL/min (A) (by C-G formula based on SCr of 2.3 mg/dL (H)).  Liver Function Tests: Recent Labs  Lab 11/10/22 1634  AST 30  ALT 13  ALKPHOS 96  BILITOT 0.8  PROT 7.2  ALBUMIN 3.7    No results for input(s): "LIPASE", "AMYLASE" in the last 168 hours. No results for input(s): "AMMONIA" in the last 168 hours.  CBC: Recent Labs  Lab 11/10/22 1634 11/11/22 0500 11/12/22 0354 11/13/22 0530 11/14/22 0425 11/15/22 0411  WBC 4.7 4.8 4.5 4.7 5.0 4.9  NEUTROABS 3.9  --   --   --   --   --   HGB  8.2* 8.5* 8.1* 8.0* 8.9* 8.9*  HCT 28.2* 28.5* 27.6* 26.6* 28.7* 29.3*  MCV 102.5* 99.3 101.8* 99.3 98.6 99.7  PLT 210 195 179 180 203 210      HbA1C: Hgb A1c MFr Bld  Date/Time Value Ref Range Status  06/30/2022 03:19 AM 5.7 (H) 4.8 - 5.6 % Final    Comment:    (NOTE)         Prediabetes: 5.7 - 6.4         Diabetes: >6.4         Glycemic control for adults with diabetes: <7.0   09/23/2015 06:26 AM 8.8 (H) 4.0 - 6.0 % Final    Urinalysis: No results for input(s): "COLORURINE", "LABSPEC", "PHURINE", "GLUCOSEU", "HGBUR", "BILIRUBINUR", "KETONESUR", "PROTEINUR", "UROBILINOGEN", "NITRITE", "LEUKOCYTESUR" in the last 72 hours.  Invalid input(s): "APPERANCEUR"     Imaging: No results found.   Medications:      cholecalciferol  2,000 Units Oral Daily   epoetin (EPOGEN/PROCRIT) injection  20,000 Units Subcutaneous Weekly   furosemide  40 mg Oral Daily   insulin aspart  0-20 Units Subcutaneous TID AC & HS   midodrine  5 mg Oral TID WC  polyethylene glycol  17 g Oral Daily   psyllium  1 packet Oral Daily   rosuvastatin  40 mg Oral Daily   spironolactone  25 mg Oral Daily    Assessment/ Plan:     Melissa Christian is a 82 y.o. white female with diabetes mellitus type II, hypertension, coronary artery disease, hyperlipidemia who is admitted to [facility] on 11/10/2022 for UGIB (upper gastrointestinal bleed) [K92.2] Symptomatic anemia [D64.9] ABLA (acute blood loss anemia) [D62]   #1: Acute kidney injury: on chronic kidney disease stage IV: baseline creatinine of 2.1, GFR of 23 on 09/20/22. Previously followed with Dr. Cherylann Ratel.  - acute kidney injury secondary to acute cardiorenal syndrome.  - chronic kidney disease secondary to diabetic nephropathy - holding triamterene and hydrochlorothiazide. Do not recommend restarting.    #2: Acute exacerbation of chronic diastolic congestive heart failure: Continue PO furosemide and PO spironolactone.    #3: Diabetes mellitus type II  with chronic kidney disease: noninsulin dependent. On pioglitazone as outpatient. Hemoglobin A1c of 5.8% on 09/01/22.    #4: Anemia with chronic kidney disease: hemoglobin 8.9 with iron deficiency.    LOS: 5 Lamont Dowdy, MD [facility] 5/14/20242:04 PM

## 2022-11-15 NOTE — TOC Transition Note (Signed)
Transition of Care [facility]) - CM/SW Discharge Note   Patient Details  Name: Melissa Christian MRN: [id number] Date of Birth: 02-05-1941  Transition of Care [facility]) CM/SW Contact:  Truddie Hidden, RN Phone Number: 11/15/2022, 12:25 PM   Clinical Narrative:    Patient discharging today HH  PT /OT previously arranged via Gwenyth Bender from [place] notified of discharge home today.   TOC signing off.      Barriers to Discharge: Continued Medical Work up   Patient Goals and CMS Choice      Discharge Placement                         Discharge Plan and Services Additional resources added to the After Visit Summary for       Post Acute Care Choice: Home Health                    HH Arranged: PT, OT [facility] Agency: [facility] Date [facility] Agency Contacted: 11/13/22 Time HH Agency Contacted: 1530 Representative spoke with at [facility] [place] Agency: Elnita Maxwell  Social Determinants of Health (SDOH) Interventions SDOH Screenings   Food Insecurity: No Food Insecurity (11/12/2022)  Housing: Low Risk  (11/12/2022)  Transportation Needs: No Transportation Needs (11/12/2022)  Utilities: Not At Risk (11/12/2022)  Tobacco Use: Low Risk  (11/10/2022)     Readmission Risk Interventions     No data to display

## 2022-11-15 NOTE — Discharge Summary (Signed)
[facility] Discharge Summary   Patient: Melissa Christian:[id number]  PCP: Danella Penton, MD  Date of admission: 11/10/2022   Date of discharge:  11/15/2022     Discharge Diagnoses:  Principal Problem:   Acute on chronic blood loss anemia Active Problems:   ABLA (acute blood loss anemia)   Symptomatic anemia   Melena   Hypotension   Acute renal failure superimposed on stage 4 chronic kidney disease (HCC)   Elevated troponin   Type 2 diabetes mellitus (HCC)   CAD (coronary artery disease)   Admitted From: Home Disposition:  Home with home health services.  Recommendations for Outpatient Follow-up:  Follow-up with PCP in 1 week, continue to monitor BP and follow with PCP to titrate medications accordingly.  Blood pressure is running soft, discontinued metoprolol, Cardura, Dyazide.  Resumed Lasix, added spironolactone for diuresis and started midodrine to support blood pressure.  Follow with PCP for further management as an outpatient. Follow with hematologist for iron infusion and Epogen injection.  Repeat CBC and BMP after 1 to 2 weeks Follow-up with cardiology for further management for CHF, discontinued Actos Follow up LABS/TEST: CBC and BMP in 1 to 2 weeks.   Diet recommendation: Cardiac and Carb modified diet  Activity: The patient is advised to gradually reintroduce usual activities, as tolerated  Discharge Condition: stable  Code Status: Full code   History of present illness: As per the H and P dictated on admission Hospital Course:  Melissa Christian is a 82 y.o. female with medical history significant for morbid obesity, CAD, diabetes mellitus, stage IV CKD and hypertension, with chronic iron deficiency anemia due to suspected occult chronic GI blood loss but with negative extensive GI workup  in December 2023 during a hospitalization for acute blood loss anemia requiring 2 units PRBCs for hemoglobin of 6.7 (negative EGD, colonoscopy and capsule endoscopy), and  who is currently on outpatient iron transfusions, who was sent from the hematology office for admission for symptomatic anemia.  Patient reportedly had an unexpected drop in her hemoglobin to 7.7 from 8.9 two weeks prior and additionally was symptomatic for dyspnea on exertion and weakness.  She reported having black stool, similar to when she was hospitalized back in December.  She denies chest pain.She received an iron transfusion prior to being sent to the ED.   ED course and data review: Vitals initially within normal limits though blood pressure was a bit soft at the time of admission at 117/42.  Hemoglobin 8.2, up from 7.7 earlier in the day at the hematology clinic.  Troponin 20.  Creatinine 2.8, up from her baseline of 1.37 and BUN 59. Stool guaiac positive. EKG, personally viewed and interpreted showing sinus at 70 with nonspecific ST-T wave changes. Chest x-ray showing cardiomegaly with trace pleural effusions. Patient was started on a Protonix infusion and a unit of PRBCs was ordered. Hospitalist consulted for admission.    Assessment and Plan: # Symptomatic anemia secondary to iron deficiency and CKD Transferrin saturation 10% on 10/26/2022, patient received Venofer IV infusion on 11/10/2022.  Patient is following hematology as an outpatient. 5/9 1 unit PRBC transfusion given.  Posttransfusion Hb 8.5. GI consulted, recommended to follow-up with hematologist, no intervention at this time. As per GI anemia less likely due to GI bleed, continue PPI. Nephrology consulted for possible Epogen due to CKD, could be causing anemia 5/14 8.9, stable continue to monitor # Chronic diastolic CHF exacerbation and Anasarca, patient has significant bilateral lower extremity edema.  08/24/2022 TTE shows LVEF 60 to 65%, grade 2 diastolic dysfunction.  Mild to mild valvular abnormality. Discontinued oral Lasix, S/p Lasix IV infusion, on 5/13 started Lasix 40 mg p.o. daily and Aldactone 25 mg p.o. daily.  Nephrology  was consulted, patient was cleared to discharge and recommended to follow as an outpatient. # AKI on CKD stage IIIb/IV, Baseline creatinine 1.37-1.9, eGFR 39--25 Creatinine 2.8 on admission, Creatinine 2.3 gradually improved.  Cleared by nephrology to follow as an outpatient. # Hypokalemia, potassium repleted.  Resolved # CAD, HTN, HLD: Mildly elevated troponin most likely due to demand ischemia, patient denied any chest pain. Continue Crestor 40 mg p.o. daily home dose. Held aspirin due to symptomatic anemia during hospital stay but H&H remained stable so resumed aspirin on discharge. Held metoprolol, triamterene-hydrochlorothiazide, and Cardura due to hypotension 5/10 started midodrine 5 mg p.o. 3 times daily with holding parameters. On 5/11 BP improved, changed midodrine to prn.  Discontinued metoprolol and doxazosin.  Patient was advised to monitor BP at home and follow with PCP to titrate medication accordingly. # IDDM T2, Patient was on Trulicity and Actos at home which were held during hospital stay.  S/p NovoLog sliding scale.  Resumed Trulicity, discontinued Actos due to CHF.  Monitor CBG at home, continue diabetic diet.  Follow with PCP. # Abnormal TSH, TSH level 4.9 slightly elevated, free T4 within normal range 1.1 Follow with PCP to repeat TFTs after 4 to 6 weeks. # Obesity, body mass index is 45.41 kg/m.  Nutrition Interventions:   Patient was seen by physical therapy, who recommended Home health, which was arranged. On the day of the discharge the patient's vitals were stable, and no other acute medical condition were reported by patient. the patient was felt safe to be discharge at Home with Home health.  Consultants: GI, nephrologist Procedures: None  Discharge Exam: General: Appear in no distress, no Rash; Oral Mucosa Clear, moist. Cardiovascular: S1 and S2 Present, no Murmur, Respiratory: normal respiratory effort, Bilateral Air entry present and no Crackles, no  wheezes Abdomen: Bowel Sound present, Soft and no tenderness, no hernia Extremities: 2+ pedal edema, no calf tenderness.  Edema improved. Neurology: alert and oriented to time, place, and person affect appropriate.  Filed Weights   11/13/22 0438 11/14/22 0402 11/15/22 0406  Weight: 127.9 kg 123.4 kg 120 kg   Vitals:   11/15/22 0406 11/15/22 0818  BP: (!) 103/46 (!) 109/98  Pulse: 89 93  Resp: 20 16  Temp: 97.9 F (36.6 C) 97.8 F (36.6 C)  SpO2: 94% 91%    DISCHARGE MEDICATION: Allergies as of 11/15/2022       Reactions   Ferrous Sulfate Itching   Keflex [cephalexin] Itching   Percocet [oxycodone-acetaminophen] Itching   Vicodin [hydrocodone-acetaminophen] Itching   Ace Inhibitors Rash   elevated potassium at higher doses   Penicillins Rash   Has patient had a PCN reaction causing immediate rash, facial/tongue/throat swelling, SOB or lightheadedness with hypotension: No Has patient had a PCN reaction causing severe rash involving mucus membranes or skin necrosis: No Has patient had a PCN reaction that required hospitalization No Has patient had a PCN reaction occurring within the last 10 years: No If all of the above answers are "NO", then may proceed with Cephalosporin use.        Medication List     STOP taking these medications    doxazosin 2 MG tablet Commonly known as: Cardura   metoprolol succinate 100 MG 24 hr tablet Commonly  known as: TOPROL-XL   pioglitazone 30 MG tablet Commonly known as: ACTOS   triamterene-hydrochlorothiazide 37.5-25 MG capsule Commonly known as: DYAZIDE       TAKE these medications    aspirin EC 81 MG tablet Take 81 mg by mouth daily.   ferrous gluconate 324 MG tablet Commonly known as: FERGON Take 1 tablet (324 mg total) by mouth daily with breakfast.   furosemide 40 MG tablet Commonly known as: LASIX Take 1 tablet (40 mg total) by mouth daily.   midodrine 5 MG tablet Commonly known as: PROAMATINE Take 1 tablet  (5 mg total) by mouth 3 (three) times daily with meals. Skip the dose if systolic BP greater than 110 mmHg   multivitamin with minerals tablet Take 1 tablet by mouth daily.   pantoprazole 40 MG tablet Commonly known as: PROTONIX Take 1 tablet (40 mg total) by mouth daily.   rosuvastatin 40 MG tablet Commonly known as: CRESTOR Take 1 tablet (40 mg total) by mouth daily.   spironolactone 25 MG tablet Commonly known as: ALDACTONE Take 1 tablet (25 mg total) by mouth daily. Start taking on: Nov 16, 2022   Trulicity 1.5 MG/0.5ML Sopn Generic drug: Dulaglutide INJECT 1.5 MG SUBCUTANEOUSLY EVERY 7 (SEVEN) DAYS   Vitamin D3 50 MCG (2000 UT) Tabs Take 2,000 Units by mouth daily.       Allergies  Allergen Reactions   Ferrous Sulfate Itching   Keflex [Cephalexin] Itching   Percocet [Oxycodone-Acetaminophen] Itching   Vicodin [Hydrocodone-Acetaminophen] Itching   Ace Inhibitors Rash    elevated potassium at higher doses   Penicillins Rash    Has patient had a PCN reaction causing immediate rash, facial/tongue/throat swelling, SOB or lightheadedness with hypotension: No Has patient had a PCN reaction causing severe rash involving mucus membranes or skin necrosis: No Has patient had a PCN reaction that required hospitalization No Has patient had a PCN reaction occurring within the last 10 years: No If all of the above answers are "NO", then may proceed with Cephalosporin use.    Discharge Instructions     Call MD for:   Complete by: As directed    Worsening of lower extremity edema   Call MD for:  difficulty breathing, headache or visual disturbances   Complete by: As directed    Call MD for:  extreme fatigue   Complete by: As directed    Call MD for:  persistant dizziness or light-headedness   Complete by: As directed    Diet - low sodium heart healthy   Complete by: As directed    Diet Carb Modified   Complete by: As directed    Discharge instructions   Complete by: As  directed    Follow-up with PCP in 1 week, continue to monitor BP and follow with PCP to titrate medications accordingly.  Blood pressure is running soft, discontinued metoprolol, Cardura, Dyazide.  Resumed Lasix, added spironolactone for diuresis and started midodrine to support blood pressure.  Follow with PCP for further management as an outpatient. Follow with hematologist for iron infusion and Epogen injection.  Repeat CBC and BMP after 1 to 2 weeks Follow-up with cardiology for further management for CHF, discontinued Actos.   Increase activity slowly   Complete by: As directed    No wound care   Complete by: As directed        The results of significant diagnostics from this hospitalization (including imaging, microbiology, ancillary and laboratory) are listed below for reference.  Significant Diagnostic Studies: DG Chest 2 View  Result Date: 11/10/2022 CLINICAL DATA:  Weakness EXAM: CHEST - 2 VIEW COMPARISON:  06/25/2022, 07/24/2014 FINDINGS: Cardiomegaly. Aortic atherosclerosis. Probable tiny pleural effusions. Mild diffuse interstitial opacity, no change and possibly due to chronic disease. No consolidation or pneumothorax. IMPRESSION: Cardiomegaly with trace pleural effusions. Electronically Signed   By: Jasmine Pang M.D.   On: 11/10/2022 17:22    Microbiology: No results found for this or any previous visit (from the past 240 hour(s)).   Labs: CBC: Recent Labs  Lab 11/10/22 1634 11/11/22 0500 11/12/22 0354 11/13/22 0530 11/14/22 0425 11/15/22 0411  WBC 4.7 4.8 4.5 4.7 5.0 4.9  NEUTROABS 3.9  --   --   --   --   --   HGB 8.2* 8.5* 8.1* 8.0* 8.9* 8.9*  HCT 28.2* 28.5* 27.6* 26.6* 28.7* 29.3*  MCV 102.5* 99.3 101.8* 99.3 98.6 99.7  PLT 210 195 179 180 203 210   Basic Metabolic Panel: Recent Labs  Lab 11/11/22 0500 11/12/22 0354 11/13/22 0530 11/14/22 0425 11/15/22 0411  NA 139 138 137 137 139  K 3.9 3.8 3.4* 3.7 3.7  CL 106 105 102 99 99  CO2 26 25 28 29   33*  GLUCOSE 117* 88 119* 92 102*  BUN 53* 53* 55* 49* 48*  CREATININE 2.57* 2.55* 2.51* 2.25* 2.30*  CALCIUM 8.3* 8.3* 8.3* 8.3* 8.3*  MG  --  2.6* 2.5* 2.3 2.4  PHOS  --  4.0 3.7 3.1 3.6   Liver Function Tests: Recent Labs  Lab 11/10/22 1634  AST 30  ALT 13  ALKPHOS 96  BILITOT 0.8  PROT 7.2  ALBUMIN 3.7   No results for input(s): "LIPASE", "AMYLASE" in the last 168 hours. No results for input(s): "AMMONIA" in the last 168 hours. Cardiac Enzymes: Recent Labs  Lab 11/11/22 0500  CKTOTAL 86   BNP (last 3 results) Recent Labs    11/12/22 0354  BNP 441.9*   CBG: Recent Labs  Lab 11/14/22 2012 11/15/22 0005 11/15/22 0403 11/15/22 0726 11/15/22 1152  GLUCAP 157* 156* 93 90 129*    Time spent: 35 minutes  Signed:  Gillis Santa  [facility]  [place] 11/15/2022 12:04 PM

## 2022-11-16 DIAGNOSIS — I2510 Atherosclerotic heart disease of native coronary artery without angina pectoris: Secondary | ICD-10-CM | POA: Diagnosis not present

## 2022-11-16 DIAGNOSIS — N179 Acute kidney failure, unspecified: Secondary | ICD-10-CM | POA: Diagnosis not present

## 2022-11-16 DIAGNOSIS — D631 Anemia in chronic kidney disease: Secondary | ICD-10-CM | POA: Diagnosis not present

## 2022-11-16 DIAGNOSIS — N184 Chronic kidney disease, stage 4 (severe): Secondary | ICD-10-CM | POA: Diagnosis not present

## 2022-11-16 DIAGNOSIS — D509 Iron deficiency anemia, unspecified: Secondary | ICD-10-CM | POA: Diagnosis not present

## 2022-11-16 DIAGNOSIS — E119 Type 2 diabetes mellitus without complications: Secondary | ICD-10-CM | POA: Diagnosis not present

## 2022-11-16 DIAGNOSIS — I5032 Chronic diastolic (congestive) heart failure: Secondary | ICD-10-CM | POA: Diagnosis not present

## 2022-11-16 DIAGNOSIS — E1122 Type 2 diabetes mellitus with diabetic chronic kidney disease: Secondary | ICD-10-CM | POA: Diagnosis not present

## 2022-11-16 DIAGNOSIS — I130 Hypertensive heart and chronic kidney disease with heart failure and stage 1 through stage 4 chronic kidney disease, or unspecified chronic kidney disease: Secondary | ICD-10-CM | POA: Diagnosis not present

## 2022-11-16 MED FILL — Iron Sucrose Inj 20 MG/ML (Fe Equiv): INTRAVENOUS | Qty: 10 | Status: AC

## 2022-11-17 ENCOUNTER — Inpatient Hospital Stay: Payer: Medicare HMO

## 2022-11-17 VITALS — BP 124/42 | HR 87 | Temp 98.6°F | Resp 18

## 2022-11-17 DIAGNOSIS — D508 Other iron deficiency anemias: Secondary | ICD-10-CM

## 2022-11-17 DIAGNOSIS — D509 Iron deficiency anemia, unspecified: Secondary | ICD-10-CM | POA: Diagnosis not present

## 2022-11-17 LAB — BPAM RBC
Blood Product Expiration Date: 202406052359
Unit Type and Rh: 6200

## 2022-11-17 LAB — TYPE AND SCREEN
ABO/RH(D): A POS
Antibody Screen: POSITIVE
Unit division: 0
Unit division: 0

## 2022-11-17 MED ORDER — SODIUM CHLORIDE 0.9 % IV SOLN
Freq: Once | INTRAVENOUS | Status: AC
  Filled 2022-11-17: qty 250

## 2022-11-17 MED ORDER — SODIUM CHLORIDE 0.9 % IV SOLN
200.0000 mg | Freq: Once | INTRAVENOUS | Status: AC
  Administered 2022-11-17: 200 mg via INTRAVENOUS
  Filled 2022-11-17: qty 200

## 2022-11-18 DIAGNOSIS — D631 Anemia in chronic kidney disease: Secondary | ICD-10-CM | POA: Diagnosis not present

## 2022-11-18 DIAGNOSIS — E1122 Type 2 diabetes mellitus with diabetic chronic kidney disease: Secondary | ICD-10-CM | POA: Diagnosis not present

## 2022-11-18 DIAGNOSIS — I130 Hypertensive heart and chronic kidney disease with heart failure and stage 1 through stage 4 chronic kidney disease, or unspecified chronic kidney disease: Secondary | ICD-10-CM | POA: Diagnosis not present

## 2022-11-18 DIAGNOSIS — E119 Type 2 diabetes mellitus without complications: Secondary | ICD-10-CM | POA: Diagnosis not present

## 2022-11-18 DIAGNOSIS — N179 Acute kidney failure, unspecified: Secondary | ICD-10-CM | POA: Diagnosis not present

## 2022-11-18 DIAGNOSIS — N184 Chronic kidney disease, stage 4 (severe): Secondary | ICD-10-CM | POA: Diagnosis not present

## 2022-11-18 DIAGNOSIS — I2510 Atherosclerotic heart disease of native coronary artery without angina pectoris: Secondary | ICD-10-CM | POA: Diagnosis not present

## 2022-11-18 DIAGNOSIS — D509 Iron deficiency anemia, unspecified: Secondary | ICD-10-CM | POA: Diagnosis not present

## 2022-11-18 DIAGNOSIS — I5032 Chronic diastolic (congestive) heart failure: Secondary | ICD-10-CM | POA: Diagnosis not present

## 2022-11-21 DIAGNOSIS — I2510 Atherosclerotic heart disease of native coronary artery without angina pectoris: Secondary | ICD-10-CM | POA: Diagnosis not present

## 2022-11-21 DIAGNOSIS — E119 Type 2 diabetes mellitus without complications: Secondary | ICD-10-CM | POA: Diagnosis not present

## 2022-11-21 DIAGNOSIS — I5032 Chronic diastolic (congestive) heart failure: Secondary | ICD-10-CM | POA: Diagnosis not present

## 2022-11-21 DIAGNOSIS — D631 Anemia in chronic kidney disease: Secondary | ICD-10-CM | POA: Diagnosis not present

## 2022-11-21 DIAGNOSIS — N179 Acute kidney failure, unspecified: Secondary | ICD-10-CM | POA: Diagnosis not present

## 2022-11-21 DIAGNOSIS — I130 Hypertensive heart and chronic kidney disease with heart failure and stage 1 through stage 4 chronic kidney disease, or unspecified chronic kidney disease: Secondary | ICD-10-CM | POA: Diagnosis not present

## 2022-11-21 DIAGNOSIS — N184 Chronic kidney disease, stage 4 (severe): Secondary | ICD-10-CM | POA: Diagnosis not present

## 2022-11-21 DIAGNOSIS — E1122 Type 2 diabetes mellitus with diabetic chronic kidney disease: Secondary | ICD-10-CM | POA: Diagnosis not present

## 2022-11-21 DIAGNOSIS — D509 Iron deficiency anemia, unspecified: Secondary | ICD-10-CM | POA: Diagnosis not present

## 2022-11-22 DIAGNOSIS — I130 Hypertensive heart and chronic kidney disease with heart failure and stage 1 through stage 4 chronic kidney disease, or unspecified chronic kidney disease: Secondary | ICD-10-CM | POA: Diagnosis not present

## 2022-11-22 DIAGNOSIS — I2781 Cor pulmonale (chronic): Secondary | ICD-10-CM | POA: Diagnosis not present

## 2022-11-22 DIAGNOSIS — I5030 Unspecified diastolic (congestive) heart failure: Secondary | ICD-10-CM | POA: Diagnosis not present

## 2022-11-22 DIAGNOSIS — D509 Iron deficiency anemia, unspecified: Secondary | ICD-10-CM | POA: Diagnosis not present

## 2022-11-22 DIAGNOSIS — I5032 Chronic diastolic (congestive) heart failure: Secondary | ICD-10-CM | POA: Diagnosis not present

## 2022-11-22 DIAGNOSIS — D631 Anemia in chronic kidney disease: Secondary | ICD-10-CM | POA: Diagnosis not present

## 2022-11-22 DIAGNOSIS — E1122 Type 2 diabetes mellitus with diabetic chronic kidney disease: Secondary | ICD-10-CM | POA: Diagnosis not present

## 2022-11-22 DIAGNOSIS — E119 Type 2 diabetes mellitus without complications: Secondary | ICD-10-CM | POA: Diagnosis not present

## 2022-11-22 DIAGNOSIS — N184 Chronic kidney disease, stage 4 (severe): Secondary | ICD-10-CM | POA: Diagnosis not present

## 2022-11-23 DIAGNOSIS — N184 Chronic kidney disease, stage 4 (severe): Secondary | ICD-10-CM | POA: Diagnosis not present

## 2022-11-23 DIAGNOSIS — D631 Anemia in chronic kidney disease: Secondary | ICD-10-CM | POA: Diagnosis not present

## 2022-11-23 DIAGNOSIS — E119 Type 2 diabetes mellitus without complications: Secondary | ICD-10-CM | POA: Diagnosis not present

## 2022-11-23 DIAGNOSIS — I130 Hypertensive heart and chronic kidney disease with heart failure and stage 1 through stage 4 chronic kidney disease, or unspecified chronic kidney disease: Secondary | ICD-10-CM | POA: Diagnosis not present

## 2022-11-23 DIAGNOSIS — I2510 Atherosclerotic heart disease of native coronary artery without angina pectoris: Secondary | ICD-10-CM | POA: Diagnosis not present

## 2022-11-23 DIAGNOSIS — E1122 Type 2 diabetes mellitus with diabetic chronic kidney disease: Secondary | ICD-10-CM | POA: Diagnosis not present

## 2022-11-23 DIAGNOSIS — N179 Acute kidney failure, unspecified: Secondary | ICD-10-CM | POA: Diagnosis not present

## 2022-11-23 DIAGNOSIS — D509 Iron deficiency anemia, unspecified: Secondary | ICD-10-CM | POA: Diagnosis not present

## 2022-11-23 DIAGNOSIS — I5032 Chronic diastolic (congestive) heart failure: Secondary | ICD-10-CM | POA: Diagnosis not present

## 2022-11-23 MED FILL — Iron Sucrose Inj 20 MG/ML (Fe Equiv): INTRAVENOUS | Qty: 10 | Status: AC

## 2022-11-24 ENCOUNTER — Inpatient Hospital Stay: Payer: Medicare HMO

## 2022-11-24 VITALS — BP 154/61 | HR 86 | Temp 97.9°F | Resp 20

## 2022-11-24 DIAGNOSIS — D508 Other iron deficiency anemias: Secondary | ICD-10-CM

## 2022-11-24 DIAGNOSIS — D509 Iron deficiency anemia, unspecified: Secondary | ICD-10-CM | POA: Diagnosis not present

## 2022-11-24 MED ORDER — SODIUM CHLORIDE 0.9 % IV SOLN
200.0000 mg | Freq: Once | INTRAVENOUS | Status: AC
  Administered 2022-11-24: 200 mg via INTRAVENOUS
  Filled 2022-11-24: qty 200

## 2022-11-24 MED ORDER — SODIUM CHLORIDE 0.9 % IV SOLN
INTRAVENOUS | Status: DC
  Filled 2022-11-24: qty 250

## 2022-11-24 NOTE — Progress Notes (Signed)
Pt has been educated and understands. Pt declined to stay 30 mins after iron infusion. VSS.  

## 2022-11-29 DIAGNOSIS — I2510 Atherosclerotic heart disease of native coronary artery without angina pectoris: Secondary | ICD-10-CM | POA: Diagnosis not present

## 2022-11-29 DIAGNOSIS — D631 Anemia in chronic kidney disease: Secondary | ICD-10-CM | POA: Diagnosis not present

## 2022-11-29 DIAGNOSIS — I130 Hypertensive heart and chronic kidney disease with heart failure and stage 1 through stage 4 chronic kidney disease, or unspecified chronic kidney disease: Secondary | ICD-10-CM | POA: Diagnosis not present

## 2022-11-29 DIAGNOSIS — N179 Acute kidney failure, unspecified: Secondary | ICD-10-CM | POA: Diagnosis not present

## 2022-11-29 DIAGNOSIS — E1122 Type 2 diabetes mellitus with diabetic chronic kidney disease: Secondary | ICD-10-CM | POA: Diagnosis not present

## 2022-11-29 DIAGNOSIS — E119 Type 2 diabetes mellitus without complications: Secondary | ICD-10-CM | POA: Diagnosis not present

## 2022-11-29 DIAGNOSIS — N184 Chronic kidney disease, stage 4 (severe): Secondary | ICD-10-CM | POA: Diagnosis not present

## 2022-11-29 DIAGNOSIS — I5032 Chronic diastolic (congestive) heart failure: Secondary | ICD-10-CM | POA: Diagnosis not present

## 2022-11-29 DIAGNOSIS — D509 Iron deficiency anemia, unspecified: Secondary | ICD-10-CM | POA: Diagnosis not present

## 2022-11-30 DIAGNOSIS — I130 Hypertensive heart and chronic kidney disease with heart failure and stage 1 through stage 4 chronic kidney disease, or unspecified chronic kidney disease: Secondary | ICD-10-CM | POA: Diagnosis not present

## 2022-11-30 DIAGNOSIS — N179 Acute kidney failure, unspecified: Secondary | ICD-10-CM | POA: Diagnosis not present

## 2022-11-30 DIAGNOSIS — E119 Type 2 diabetes mellitus without complications: Secondary | ICD-10-CM | POA: Diagnosis not present

## 2022-11-30 DIAGNOSIS — D509 Iron deficiency anemia, unspecified: Secondary | ICD-10-CM | POA: Diagnosis not present

## 2022-11-30 DIAGNOSIS — I5032 Chronic diastolic (congestive) heart failure: Secondary | ICD-10-CM | POA: Diagnosis not present

## 2022-11-30 DIAGNOSIS — N184 Chronic kidney disease, stage 4 (severe): Secondary | ICD-10-CM | POA: Diagnosis not present

## 2022-11-30 DIAGNOSIS — D631 Anemia in chronic kidney disease: Secondary | ICD-10-CM | POA: Diagnosis not present

## 2022-11-30 DIAGNOSIS — E1122 Type 2 diabetes mellitus with diabetic chronic kidney disease: Secondary | ICD-10-CM | POA: Diagnosis not present

## 2022-11-30 DIAGNOSIS — I2510 Atherosclerotic heart disease of native coronary artery without angina pectoris: Secondary | ICD-10-CM | POA: Diagnosis not present

## 2022-12-05 DIAGNOSIS — D631 Anemia in chronic kidney disease: Secondary | ICD-10-CM | POA: Diagnosis not present

## 2022-12-05 DIAGNOSIS — I2510 Atherosclerotic heart disease of native coronary artery without angina pectoris: Secondary | ICD-10-CM | POA: Diagnosis not present

## 2022-12-05 DIAGNOSIS — I5032 Chronic diastolic (congestive) heart failure: Secondary | ICD-10-CM | POA: Diagnosis not present

## 2022-12-05 DIAGNOSIS — E1122 Type 2 diabetes mellitus with diabetic chronic kidney disease: Secondary | ICD-10-CM | POA: Diagnosis not present

## 2022-12-05 DIAGNOSIS — E119 Type 2 diabetes mellitus without complications: Secondary | ICD-10-CM | POA: Diagnosis not present

## 2022-12-05 DIAGNOSIS — D509 Iron deficiency anemia, unspecified: Secondary | ICD-10-CM | POA: Diagnosis not present

## 2022-12-05 DIAGNOSIS — N179 Acute kidney failure, unspecified: Secondary | ICD-10-CM | POA: Diagnosis not present

## 2022-12-05 DIAGNOSIS — N184 Chronic kidney disease, stage 4 (severe): Secondary | ICD-10-CM | POA: Diagnosis not present

## 2022-12-05 DIAGNOSIS — I130 Hypertensive heart and chronic kidney disease with heart failure and stage 1 through stage 4 chronic kidney disease, or unspecified chronic kidney disease: Secondary | ICD-10-CM | POA: Diagnosis not present

## 2022-12-06 DIAGNOSIS — N184 Chronic kidney disease, stage 4 (severe): Secondary | ICD-10-CM | POA: Diagnosis not present

## 2022-12-06 DIAGNOSIS — I5032 Chronic diastolic (congestive) heart failure: Secondary | ICD-10-CM | POA: Diagnosis not present

## 2022-12-06 DIAGNOSIS — E119 Type 2 diabetes mellitus without complications: Secondary | ICD-10-CM | POA: Diagnosis not present

## 2022-12-06 DIAGNOSIS — N179 Acute kidney failure, unspecified: Secondary | ICD-10-CM | POA: Diagnosis not present

## 2022-12-06 DIAGNOSIS — D509 Iron deficiency anemia, unspecified: Secondary | ICD-10-CM | POA: Diagnosis not present

## 2022-12-06 DIAGNOSIS — D631 Anemia in chronic kidney disease: Secondary | ICD-10-CM | POA: Diagnosis not present

## 2022-12-06 DIAGNOSIS — I130 Hypertensive heart and chronic kidney disease with heart failure and stage 1 through stage 4 chronic kidney disease, or unspecified chronic kidney disease: Secondary | ICD-10-CM | POA: Diagnosis not present

## 2022-12-06 DIAGNOSIS — I2510 Atherosclerotic heart disease of native coronary artery without angina pectoris: Secondary | ICD-10-CM | POA: Diagnosis not present

## 2022-12-06 DIAGNOSIS — E1122 Type 2 diabetes mellitus with diabetic chronic kidney disease: Secondary | ICD-10-CM | POA: Diagnosis not present

## 2022-12-12 DIAGNOSIS — I2510 Atherosclerotic heart disease of native coronary artery without angina pectoris: Secondary | ICD-10-CM | POA: Diagnosis not present

## 2022-12-12 DIAGNOSIS — I5032 Chronic diastolic (congestive) heart failure: Secondary | ICD-10-CM | POA: Diagnosis not present

## 2022-12-12 DIAGNOSIS — N179 Acute kidney failure, unspecified: Secondary | ICD-10-CM | POA: Diagnosis not present

## 2022-12-12 DIAGNOSIS — E1122 Type 2 diabetes mellitus with diabetic chronic kidney disease: Secondary | ICD-10-CM | POA: Diagnosis not present

## 2022-12-12 DIAGNOSIS — E119 Type 2 diabetes mellitus without complications: Secondary | ICD-10-CM | POA: Diagnosis not present

## 2022-12-12 DIAGNOSIS — I130 Hypertensive heart and chronic kidney disease with heart failure and stage 1 through stage 4 chronic kidney disease, or unspecified chronic kidney disease: Secondary | ICD-10-CM | POA: Diagnosis not present

## 2022-12-12 DIAGNOSIS — D509 Iron deficiency anemia, unspecified: Secondary | ICD-10-CM | POA: Diagnosis not present

## 2022-12-12 DIAGNOSIS — D631 Anemia in chronic kidney disease: Secondary | ICD-10-CM | POA: Diagnosis not present

## 2022-12-12 DIAGNOSIS — N184 Chronic kidney disease, stage 4 (severe): Secondary | ICD-10-CM | POA: Diagnosis not present

## 2022-12-13 DIAGNOSIS — E1122 Type 2 diabetes mellitus with diabetic chronic kidney disease: Secondary | ICD-10-CM | POA: Diagnosis not present

## 2022-12-13 DIAGNOSIS — N179 Acute kidney failure, unspecified: Secondary | ICD-10-CM | POA: Diagnosis not present

## 2022-12-13 DIAGNOSIS — D631 Anemia in chronic kidney disease: Secondary | ICD-10-CM | POA: Diagnosis not present

## 2022-12-13 DIAGNOSIS — N1832 Chronic kidney disease, stage 3b: Secondary | ICD-10-CM | POA: Diagnosis not present

## 2022-12-14 DIAGNOSIS — D509 Iron deficiency anemia, unspecified: Secondary | ICD-10-CM | POA: Diagnosis not present

## 2022-12-14 DIAGNOSIS — I5032 Chronic diastolic (congestive) heart failure: Secondary | ICD-10-CM | POA: Diagnosis not present

## 2022-12-14 DIAGNOSIS — D631 Anemia in chronic kidney disease: Secondary | ICD-10-CM | POA: Diagnosis not present

## 2022-12-14 DIAGNOSIS — N184 Chronic kidney disease, stage 4 (severe): Secondary | ICD-10-CM | POA: Diagnosis not present

## 2022-12-14 DIAGNOSIS — E1122 Type 2 diabetes mellitus with diabetic chronic kidney disease: Secondary | ICD-10-CM | POA: Diagnosis not present

## 2022-12-14 DIAGNOSIS — I2510 Atherosclerotic heart disease of native coronary artery without angina pectoris: Secondary | ICD-10-CM | POA: Diagnosis not present

## 2022-12-14 DIAGNOSIS — E119 Type 2 diabetes mellitus without complications: Secondary | ICD-10-CM | POA: Diagnosis not present

## 2022-12-14 DIAGNOSIS — I130 Hypertensive heart and chronic kidney disease with heart failure and stage 1 through stage 4 chronic kidney disease, or unspecified chronic kidney disease: Secondary | ICD-10-CM | POA: Diagnosis not present

## 2022-12-14 DIAGNOSIS — N179 Acute kidney failure, unspecified: Secondary | ICD-10-CM | POA: Diagnosis not present

## 2022-12-19 DIAGNOSIS — N179 Acute kidney failure, unspecified: Secondary | ICD-10-CM | POA: Diagnosis not present

## 2022-12-19 DIAGNOSIS — D631 Anemia in chronic kidney disease: Secondary | ICD-10-CM | POA: Diagnosis not present

## 2022-12-19 DIAGNOSIS — D509 Iron deficiency anemia, unspecified: Secondary | ICD-10-CM | POA: Diagnosis not present

## 2022-12-19 DIAGNOSIS — E119 Type 2 diabetes mellitus without complications: Secondary | ICD-10-CM | POA: Diagnosis not present

## 2022-12-19 DIAGNOSIS — I2510 Atherosclerotic heart disease of native coronary artery without angina pectoris: Secondary | ICD-10-CM | POA: Diagnosis not present

## 2022-12-19 DIAGNOSIS — N184 Chronic kidney disease, stage 4 (severe): Secondary | ICD-10-CM | POA: Diagnosis not present

## 2022-12-19 DIAGNOSIS — I5032 Chronic diastolic (congestive) heart failure: Secondary | ICD-10-CM | POA: Diagnosis not present

## 2022-12-19 DIAGNOSIS — I130 Hypertensive heart and chronic kidney disease with heart failure and stage 1 through stage 4 chronic kidney disease, or unspecified chronic kidney disease: Secondary | ICD-10-CM | POA: Diagnosis not present

## 2022-12-19 DIAGNOSIS — E1122 Type 2 diabetes mellitus with diabetic chronic kidney disease: Secondary | ICD-10-CM | POA: Diagnosis not present

## 2022-12-22 ENCOUNTER — Inpatient Hospital Stay: Payer: Medicare HMO | Attending: Oncology

## 2022-12-22 DIAGNOSIS — D509 Iron deficiency anemia, unspecified: Secondary | ICD-10-CM | POA: Insufficient documentation

## 2022-12-22 DIAGNOSIS — N183 Chronic kidney disease, stage 3 unspecified: Secondary | ICD-10-CM | POA: Diagnosis not present

## 2022-12-22 DIAGNOSIS — D500 Iron deficiency anemia secondary to blood loss (chronic): Secondary | ICD-10-CM

## 2022-12-22 LAB — CBC WITH DIFFERENTIAL (CANCER CENTER ONLY)
Abs Immature Granulocytes: 0.03 10*3/uL (ref 0.00–0.07)
Basophils Absolute: 0 10*3/uL (ref 0.0–0.1)
Basophils Relative: 1 %
Eosinophils Absolute: 0.2 10*3/uL (ref 0.0–0.5)
Eosinophils Relative: 3 %
HCT: 45.3 % (ref 36.0–46.0)
Hemoglobin: 14 g/dL (ref 12.0–15.0)
Immature Granulocytes: 0 %
Lymphocytes Relative: 9 %
Lymphs Abs: 0.6 10*3/uL — ABNORMAL LOW (ref 0.7–4.0)
MCH: 29.5 pg (ref 26.0–34.0)
MCHC: 30.9 g/dL (ref 30.0–36.0)
MCV: 95.4 fL (ref 80.0–100.0)
Monocytes Absolute: 0.5 10*3/uL (ref 0.1–1.0)
Monocytes Relative: 7 %
Neutro Abs: 5.4 10*3/uL (ref 1.7–7.7)
Neutrophils Relative %: 80 %
Platelet Count: 225 10*3/uL (ref 150–400)
RBC: 4.75 MIL/uL (ref 3.87–5.11)
RDW: 15.1 % (ref 11.5–15.5)
WBC Count: 6.7 10*3/uL (ref 4.0–10.5)
nRBC: 0 % (ref 0.0–0.2)

## 2022-12-22 LAB — FERRITIN: Ferritin: 89 ng/mL (ref 11–307)

## 2022-12-22 LAB — RETIC PANEL
Immature Retic Fract: 5.5 % (ref 2.3–15.9)
RBC.: 4.73 MIL/uL (ref 3.87–5.11)
Retic Count, Absolute: 29.8 10*3/uL (ref 19.0–186.0)
Retic Ct Pct: 0.6 % (ref 0.4–3.1)
Reticulocyte Hemoglobin: 32.4 pg (ref >27.9)

## 2022-12-22 LAB — IRON AND TIBC
Iron: 77 ug/dL (ref 28–170)
Saturation Ratios: 24 % (ref 10.4–31.8)
TIBC: 322 ug/dL (ref 250–450)
UIBC: 245 ug/dL

## 2022-12-23 DIAGNOSIS — I130 Hypertensive heart and chronic kidney disease with heart failure and stage 1 through stage 4 chronic kidney disease, or unspecified chronic kidney disease: Secondary | ICD-10-CM | POA: Diagnosis not present

## 2022-12-23 DIAGNOSIS — E1122 Type 2 diabetes mellitus with diabetic chronic kidney disease: Secondary | ICD-10-CM | POA: Diagnosis not present

## 2022-12-23 DIAGNOSIS — I2510 Atherosclerotic heart disease of native coronary artery without angina pectoris: Secondary | ICD-10-CM | POA: Diagnosis not present

## 2022-12-23 DIAGNOSIS — N184 Chronic kidney disease, stage 4 (severe): Secondary | ICD-10-CM | POA: Diagnosis not present

## 2022-12-23 DIAGNOSIS — E119 Type 2 diabetes mellitus without complications: Secondary | ICD-10-CM | POA: Diagnosis not present

## 2022-12-23 DIAGNOSIS — D509 Iron deficiency anemia, unspecified: Secondary | ICD-10-CM | POA: Diagnosis not present

## 2022-12-23 DIAGNOSIS — N179 Acute kidney failure, unspecified: Secondary | ICD-10-CM | POA: Diagnosis not present

## 2022-12-23 DIAGNOSIS — I5032 Chronic diastolic (congestive) heart failure: Secondary | ICD-10-CM | POA: Diagnosis not present

## 2022-12-23 DIAGNOSIS — D631 Anemia in chronic kidney disease: Secondary | ICD-10-CM | POA: Diagnosis not present

## 2022-12-26 DIAGNOSIS — I130 Hypertensive heart and chronic kidney disease with heart failure and stage 1 through stage 4 chronic kidney disease, or unspecified chronic kidney disease: Secondary | ICD-10-CM | POA: Diagnosis not present

## 2022-12-26 DIAGNOSIS — E119 Type 2 diabetes mellitus without complications: Secondary | ICD-10-CM | POA: Diagnosis not present

## 2022-12-26 DIAGNOSIS — E1122 Type 2 diabetes mellitus with diabetic chronic kidney disease: Secondary | ICD-10-CM | POA: Diagnosis not present

## 2022-12-26 DIAGNOSIS — I5032 Chronic diastolic (congestive) heart failure: Secondary | ICD-10-CM | POA: Diagnosis not present

## 2022-12-26 DIAGNOSIS — N179 Acute kidney failure, unspecified: Secondary | ICD-10-CM | POA: Diagnosis not present

## 2022-12-26 DIAGNOSIS — D631 Anemia in chronic kidney disease: Secondary | ICD-10-CM | POA: Diagnosis not present

## 2022-12-26 DIAGNOSIS — I2510 Atherosclerotic heart disease of native coronary artery without angina pectoris: Secondary | ICD-10-CM | POA: Diagnosis not present

## 2022-12-26 DIAGNOSIS — N184 Chronic kidney disease, stage 4 (severe): Secondary | ICD-10-CM | POA: Diagnosis not present

## 2022-12-26 DIAGNOSIS — D509 Iron deficiency anemia, unspecified: Secondary | ICD-10-CM | POA: Diagnosis not present

## 2022-12-26 MED FILL — Iron Sucrose Inj 20 MG/ML (Fe Equiv): INTRAVENOUS | Qty: 10 | Status: AC

## 2022-12-27 ENCOUNTER — Inpatient Hospital Stay: Payer: Medicare HMO

## 2022-12-27 ENCOUNTER — Inpatient Hospital Stay (HOSPITAL_BASED_OUTPATIENT_CLINIC_OR_DEPARTMENT_OTHER): Payer: Medicare HMO | Admitting: Oncology

## 2022-12-27 ENCOUNTER — Encounter: Payer: Self-pay | Admitting: Oncology

## 2022-12-27 VITALS — BP 136/56 | HR 74 | Temp 97.1°F | Resp 18 | Wt 226.9 lb

## 2022-12-27 DIAGNOSIS — D132 Benign neoplasm of duodenum: Secondary | ICD-10-CM

## 2022-12-27 DIAGNOSIS — N184 Chronic kidney disease, stage 4 (severe): Secondary | ICD-10-CM

## 2022-12-27 DIAGNOSIS — D631 Anemia in chronic kidney disease: Secondary | ICD-10-CM

## 2022-12-27 DIAGNOSIS — N183 Chronic kidney disease, stage 3 unspecified: Secondary | ICD-10-CM | POA: Diagnosis not present

## 2022-12-27 DIAGNOSIS — D500 Iron deficiency anemia secondary to blood loss (chronic): Secondary | ICD-10-CM

## 2022-12-27 DIAGNOSIS — D509 Iron deficiency anemia, unspecified: Secondary | ICD-10-CM | POA: Diagnosis not present

## 2022-12-27 NOTE — Progress Notes (Signed)
Hematology/Oncology Progress note [phone] [phone]      CHIEF COMPLAINTS/REASON FOR VISIT:  Iron deficiency anemia  ASSESSMENT & PLAN:   IDA (iron deficiency anemia) Labs reviewed and discussed with patient. severe iron deficiency anemia. Suspect chronic occult blood loss from GI tract.  Patient has extensive GI workup. S/p IV Venofer, Labs are reviewed and discussed with patient. Lab Results  Component Value Date   IRON 77 12/22/2022   TIBC 322 12/22/2022   IRONPCTSAT 24 12/22/2022   FERRITIN 89 12/22/2022   HGB 14.0 12/22/2022    Hold off IV Venofer treatments today.  Follow-up in 4 months    Anemia in chronic kidney disease (CKD) Continue close monitor.   No need for EPO replacement.  Adenomatous duodenal polyp Patient declined resection due to concern of procedure complications.    Orders Placed This Encounter  Procedures   CBC with Differential (Cancer Center Only)    Standing Status:   Future    Standing Expiration Date:   12/27/2023   Iron and TIBC    Standing Status:   Future    Standing Expiration Date:   12/27/2023   Ferritin    Standing Status:   Future    Standing Expiration Date:   12/27/2023   Follow-up in 4 months. All questions were answered. The patient knows to call the clinic with any problems, questions or concerns.  Rickard Patience, MD, PhD [facility] Hematology Oncology 12/27/2022     HISTORY OF PRESENTING ILLNESS:  Melissa Christian is a  82 y.o.  female with PMH listed below who was referred to me for anemia Reviewed patient's recent labs that was done.  She was found to have abnormal CBC on 02/22/2022, with a hemoglobin of 8.4, MCV 83.5.  B12 adequate, patient was advised to take ferrous sulfate 325 mg twice daily.  She had a repeat hemoglobin 03/31/2022 which was 7.9, ferritin 9.  Reviewed patient's previous labs ordered by primary care physician's office, anemia is chronic onset , since early 2023. Patient reports  feeling tired.  No energy. She denies recent chest pain on exertion, pre-syncopal episodes, or palpitations She had not noticed any recent bleeding such as epistaxis, hematuria or hematochezia.  She denies over the counter NSAID ingestion. She is on antiplatelets agent-aspirin 81 mg. Continue had a colonoscopy. She was accompanied by her sister.  INTERVAL HISTORY Melissa Christian is a 82 y.o. female who has above history reviewed by me today presents for follow up visit for iron deficiency anemia.  S/p IV venofer.  Patient reports fatigue level has improved. During the interval, patient establish with gastroenterology and had GI workup. 06/26/2022 colonoscopy showed entire examined colon is normal.  Examined portion of ileum was normal. Upper endoscopy showed normal esophagus.  A few gastric polyps.  A single duodenal polyp biopsied.-Pathology showed duodenum tubular adenoma 07/06/2022 capsule study showed duodenal polyp, otherwise normal study.  INTERVAL HISTORY Melissa Christian is a 82 y.o. female who has above history reviewed by me today presents for follow up visit for anemia.  Patient reports feeling slightly better, less fatigued.   MEDICAL HISTORY:  Past Medical History:  Diagnosis Date   Anemia    CAD (coronary artery disease)    a. NSTEMI 09/2015: cardiac cath: LM no obs dz, mLAD 95% s/p PCI/DES 0%, LCx no obs, RCA no obs, LVEF 55-65%   Chronic kidney disease (CKD), stage III (moderate) (HCC)    Diabetes mellitus with complication (HCC)  HLD (hyperlipidemia)    Hypertension    Low blood potassium    NSTEMI (non-ST elevated myocardial infarction) (HCC)    PONV (postoperative nausea and vomiting)    Valvular heart disease    a. 09/2015: EF 55-60%, challenging images unable to exclude mild mid-distal anterior to apical HK, nl LV dias fxn, mild AI/MR, normal size of left atrium, RV systolic function normal, PASP normal      SURGICAL HISTORY: Past Surgical History:  Procedure  Laterality Date   BACK SURGERY     CARDIAC CATHETERIZATION N/A 09/23/2015   Procedure: Right and Left Heart Cath;  Surgeon: Antonieta Iba, MD;  Location: [facility] INVASIVE CV LAB;  Service: Cardiovascular;  Laterality: N/A;   CARDIAC CATHETERIZATION N/A 09/23/2015   Procedure: Coronary Stent Intervention;  Surgeon: Alwyn Pea, MD;  Location: [facility] INVASIVE CV LAB;  Service: Cardiovascular;  Laterality: N/A;   CATARACT EXTRACTION W/PHACO Left 04/26/2017   Procedure: CATARACT EXTRACTION PHACO AND INTRAOCULAR LENS PLACEMENT (IOC) LEFT DIABETIC;  Surgeon: Lockie Mola, MD;  Location: [facility];  Service: Ophthalmology;  Laterality: Left;  diabetic-oral meds   CATARACT EXTRACTION W/PHACO Right 05/31/2017   Procedure: CATARACT EXTRACTION PHACO AND INTRAOCULAR LENS PLACEMENT (IOC) RIGHT DIABETIC;  Surgeon: Lockie Mola, MD;  Location: [facility] SURGERY CNTR;  Service: Ophthalmology;  Laterality: Right;   CHOLECYSTECTOMY N/A 06/16/2016   Procedure: LAPAROSCOPIC CHOLECYSTECTOMY WITH INTRAOPERATIVE CHOLANGIOGRAM;  Surgeon: Nadeen Landau, MD;  Location: [facility] ORS;  Service: General;  Laterality: N/A;   COLONOSCOPY WITH PROPOFOL N/A 06/26/2022   Procedure: COLONOSCOPY WITH PROPOFOL;  Surgeon: Wyline Mood, MD;  Location: [facility] ENDOSCOPY;  Service: Gastroenterology;  Laterality: N/A;   CORONARY STENT PLACEMENT     ESOPHAGOGASTRODUODENOSCOPY N/A 06/30/2022   Procedure: ESOPHAGOGASTRODUODENOSCOPY (EGD);  Surgeon: Toney Reil, MD;  Location: [facility] ENDOSCOPY;  Service: Gastroenterology;  Laterality: N/A;   ESOPHAGOGASTRODUODENOSCOPY (EGD) WITH PROPOFOL N/A 06/26/2022   Procedure: ESOPHAGOGASTRODUODENOSCOPY (EGD) WITH PROPOFOL;  Surgeon: Wyline Mood, MD;  Location: [facility] ENDOSCOPY;  Service: Gastroenterology;  Laterality: N/A;   GIVENS CAPSULE STUDY N/A 06/30/2022   Procedure: GIVENS CAPSULE STUDY;  Surgeon: Toney Reil, MD;  Location: [facility] ENDOSCOPY;  Service:  Gastroenterology;  Laterality: N/A;  PLACE CAPSULE DURING EGD   JOINT REPLACEMENT     LUMBAR FUSION     TOTAL HIP ARTHROPLASTY      SOCIAL HISTORY: Social History   Socioeconomic History   Marital status: Widowed    Spouse name: Not on file   Number of children: Not on file   Years of education: Not on file   Highest education level: Not on file  Occupational History   Not on file  Tobacco Use   Smoking status: Never   Smokeless tobacco: Never  Vaping Use   Vaping Use: Never used  Substance and Sexual Activity   Alcohol use: No    Alcohol/week: 0.0 standard drinks of alcohol   Drug use: No   Sexual activity: Not Currently    Birth control/protection: None, Post-menopausal  Other Topics Concern   Not on file  Social History Narrative   Not on file   Social Determinants of Health   Financial Resource Strain: Not on file  Food Insecurity: No Food Insecurity (11/12/2022)   Hunger Vital Sign    Worried About Running Out of Food in the Last Year: Never true    Ran Out of Food in the Last Year: Never true  Transportation Needs: No Transportation Needs (11/12/2022)   PRAPARE -  Administrator, Civil Service (Medical): No    Lack of Transportation (Non-Medical): No  Physical Activity: Not on file  Stress: Not on file  Social Connections: Not on file  Intimate Partner Violence: Not At Risk (11/12/2022)   Humiliation, Afraid, Rape, and Kick questionnaire    Fear of Current or Ex-Partner: No    Emotionally Abused: No    Physically Abused: No    Sexually Abused: No    FAMILY HISTORY: Family History  Problem Relation Age of Onset   Diabetes Mother    Hypertension Mother    Heart disease Father    Cancer Maternal Grandfather     ALLERGIES:  is allergic to ferrous sulfate, keflex [cephalexin], percocet [oxycodone-acetaminophen], vicodin [hydrocodone-acetaminophen], ace inhibitors, and penicillins.  MEDICATIONS:  Current Outpatient Medications  Medication Sig  Dispense Refill   aspirin EC 81 MG tablet Take 81 mg by mouth daily.     Cholecalciferol (VITAMIN D3) 2000 units TABS Take 2,000 Units by mouth daily.     ferrous gluconate (FERGON) 324 MG tablet Take 1 tablet (324 mg total) by mouth daily with breakfast. 60 tablet 3   furosemide (LASIX) 40 MG tablet Take 1 tablet (40 mg total) by mouth daily. 90 tablet 3   midodrine (PROAMATINE) 5 MG tablet Take 1 tablet (5 mg total) by mouth 3 (three) times daily with meals. Skip the dose if systolic BP greater than 110 mmHg 90 tablet 2   Multiple Vitamins-Minerals (MULTIVITAMIN WITH MINERALS) tablet Take 1 tablet by mouth daily. 120 tablet 2   pantoprazole (PROTONIX) 40 MG tablet Take 1 tablet (40 mg total) by mouth daily. 30 tablet 1   rosuvastatin (CRESTOR) 40 MG tablet Take 1 tablet (40 mg total) by mouth daily. 90 tablet 3   spironolactone (ALDACTONE) 25 MG tablet Take 1 tablet (25 mg total) by mouth daily. 30 tablet 5   TRULICITY 1.5 MG/0.5ML SOPN INJECT 1.5 MG SUBCUTANEOUSLY EVERY 7 (SEVEN) DAYS     No current facility-administered medications for this visit.    Review of Systems  Constitutional:  Positive for fatigue. Negative for appetite change, chills and fever.  HENT:   Negative for hearing loss and voice change.   Eyes:  Negative for eye problems.  Respiratory:  Negative for chest tightness and cough.   Cardiovascular:  Negative for chest pain.  Gastrointestinal:  Negative for abdominal distention, abdominal pain and blood in stool.  Endocrine: Negative for hot flashes.  Genitourinary:  Negative for difficulty urinating and frequency.   Musculoskeletal:  Negative for arthralgias.  Skin:  Negative for itching and rash.  Neurological:  Negative for extremity weakness.  Hematological:  Negative for adenopathy.  Psychiatric/Behavioral:  Negative for confusion.     PHYSICAL EXAMINATION: ECOG PERFORMANCE STATUS: 2 - Symptomatic, <50% confined to bed Vitals:   12/27/22 1340  BP: (!) 136/56   Pulse: 74  Resp: 18  Temp: (!) 97.1 F (36.2 C)   Filed Weights   12/27/22 1340  Weight: 226 lb 14.4 oz (102.9 kg)     Physical Exam Constitutional:      General: She is not in acute distress.    Appearance: She is obese.     Comments: Patient sits in the wheelchair  HENT:     Head: Normocephalic and atraumatic.  Eyes:     General: No scleral icterus. Cardiovascular:     Rate and Rhythm: Normal rate and regular rhythm.     Heart sounds: Murmur heard.  Pulmonary:  Effort: Pulmonary effort is normal. No respiratory distress.     Breath sounds: No wheezing.  Abdominal:     General: Bowel sounds are normal. There is no distension.     Palpations: Abdomen is soft.  Musculoskeletal:        General: No deformity. Normal range of motion.     Cervical back: Normal range of motion and neck supple.  Skin:    General: Skin is warm and dry.     Coloration: Skin is not pale.     Findings: No erythema.  Neurological:     Mental Status: She is alert and oriented to person, place, and time. Mental status is at baseline.     Cranial Nerves: No cranial nerve deficit.  Psychiatric:        Mood and Affect: Mood normal.      LABORATORY DATA:  I have reviewed the data as listed    Latest Ref Rng & Units 12/22/2022   12:37 PM 11/15/2022    4:11 AM 11/14/2022    4:25 AM  CBC  WBC 4.0 - 10.5 K/uL 6.7  4.9  5.0   Hemoglobin 12.0 - 15.0 g/dL 16.1  8.9  8.9   Hematocrit 36.0 - 46.0 % 45.3  29.3  28.7   Platelets 150 - 400 K/uL 225  210  203       Latest Ref Rng & Units 11/15/2022    4:11 AM 11/14/2022    4:25 AM 11/13/2022    5:30 AM  CMP  Glucose 70 - 99 mg/dL 096  92  045   BUN 8 - 23 mg/dL 48  49  55   Creatinine 0.44 - 1.00 mg/dL 4.09  8.11  9.14   Sodium 135 - 145 mmol/L 139  137  137   Potassium 3.5 - 5.1 mmol/L 3.7  3.7  3.4   Chloride 98 - 111 mmol/L 99  99  102   CO2 22 - 32 mmol/L 33  29  28   Calcium 8.9 - 10.3 mg/dL 8.3  8.3  8.3       Component Value Date/Time    IRON 77 12/22/2022 1237   TIBC 322 12/22/2022 1237   FERRITIN 89 12/22/2022 1237   IRONPCTSAT 24 12/22/2022 1237     RADIOGRAPHIC STUDIES: I have personally reviewed the radiological images as listed and agreed with the findings in the report. No results found.

## 2022-12-27 NOTE — Assessment & Plan Note (Signed)
Continue close monitor.   No need for EPO replacement.

## 2022-12-27 NOTE — Assessment & Plan Note (Signed)
Patient declined resection due to concern of procedure complications. 

## 2022-12-27 NOTE — Assessment & Plan Note (Addendum)
Labs reviewed and discussed with patient. severe iron deficiency anemia. Suspect chronic occult blood loss from GI tract.  Patient has extensive GI workup. S/p IV Venofer, Labs are reviewed and discussed with patient. Lab Results  Component Value Date   IRON 77 12/22/2022   TIBC 322 12/22/2022   IRONPCTSAT 24 12/22/2022   FERRITIN 89 12/22/2022   HGB 14.0 12/22/2022    Hold off IV Venofer treatments today.  Follow-up in 4 months

## 2023-01-10 ENCOUNTER — Ambulatory Visit: Payer: Medicare HMO | Admitting: Cardiovascular Disease

## 2023-02-13 DIAGNOSIS — N184 Chronic kidney disease, stage 4 (severe): Secondary | ICD-10-CM | POA: Diagnosis not present

## 2023-02-13 DIAGNOSIS — E1122 Type 2 diabetes mellitus with diabetic chronic kidney disease: Secondary | ICD-10-CM | POA: Diagnosis not present

## 2023-02-13 DIAGNOSIS — R600 Localized edema: Secondary | ICD-10-CM | POA: Diagnosis not present

## 2023-02-13 DIAGNOSIS — R809 Proteinuria, unspecified: Secondary | ICD-10-CM | POA: Diagnosis not present

## 2023-02-17 ENCOUNTER — Other Ambulatory Visit: Payer: Self-pay

## 2023-02-23 DIAGNOSIS — N184 Chronic kidney disease, stage 4 (severe): Secondary | ICD-10-CM | POA: Diagnosis not present

## 2023-03-02 NOTE — Progress Notes (Signed)
Cardiology Office Note  Date:  03/03/2023   ID:  Valencia, Rauch July 21, 1940, MRN [id number]  PCP:  Danella Penton, MD   Chief Complaint  Patient presents with   [facility] Follow up     Patient c/o bilateral LE edema. Medications reviewed by the patient verbally.     HPI:  Melissa Christian is a 82 y.o. female with hx of CAD NSTEMI 09/2015: cardiac cath: mLAD 95% s/p PCI/DES 0%, LCx no obs, RCA no obs, LVEF 55-65%,Xience stent  lap chole 06/2016 hypertension,  EF >55% on echo 09/23/15 Hyperlipidemia Mild AI Pulmonary hypertension Who presents for follow up of her CAD, S/p PCI, pulmonary hypertension, diastolic CHF  Last seen by myself in clinic February 2024  In hospital 5/24,  acute blood loss anemia requiring 2 units PRBCs for hemoglobin of 6.7 (negative EGD, colonoscopy and capsule endoscopy),  on outpatient iron transfusions   Lab work reviewed HGB 8.9 up to 14.8 Anemia resolved, feels better CR 1.99, BUN 45  Trace LE edema, sedentary, sitting a lot at home Walks with walker,  Recliner, but feet still down On Lasix 40 daily  At the time of discharge May 2024, off metoprolol and cardura and triamterene hydrochlorothiazide at d/c from hospital 5/24 for low blood pressure  BP good today 120 systolic  EKG personally reviewed by myself on todays visit EKG Interpretation Date/Time:  Friday March 03 2023 13:56:09 EDT Ventricular Rate:  91 PR Interval:  196 QRS Duration:  132 QT Interval:  394 QTC Calculation: 484 R Axis:   -79  Text Interpretation: Normal sinus rhythm Right bundle branch block Left anterior fascicular block ** Bifascicular block ** When compared with ECG of 10-Nov-2022 16:27, PR interval has decreased (RBBB and left anterior fascicular block) has replaced RSR' pattern in V1 Criteria for Anterior infarct are no longer Present Criteria for Anterolateral infarct are no longer Present Criteria for Inferior infarct are no longer Present Confirmed by Julien Nordmann [id number] on 03/03/2023 2:22:33 PM   Other past medical history reviewed In the hospital 12/23: Records reviewed ABLA (acute blood loss anemia) , hemoglobin of 6.2 Status post 3 units packed red blood cells total.  EGD and COLO performed, capsule did not pass.  repeat EGD 12/28 with placement of capsule in small bowel.  per Dr Andreas Blower study negative ofr any bleed  gets IV and infusion said the cancer center with Dr.YU  Iron studies normal, on iron pill Scheduled to be seen at [facility] for polyp 2/13, procedure 2/16  Prior issues with incontinence  Prior records reviewed Echo 07/03/2017 EF 60 to 65%, no RWMA, G1DD, mild AI, mild MR, normal RVSP, PASP normal.    Lexiscan Myoview 07/18/2017 for atypical chest discomfort ruled low risk.  Repeat echo 09/2018 for monitoring of valvular heart disease with LVEF 60 to 65%, DD, normal RVSF, RVSP  54.13mmHg, moderate TR, moderate MR.  Echocardiogram March 2020 Normal ejection fraction estimated 60%, moderately elevated right heart pressures At that time HCTZ was changed to Lasix 20 daily   PMH:   has a past medical history of Anemia, CAD (coronary artery disease), Chronic kidney disease (CKD), stage III (moderate) (HCC), Diabetes mellitus with complication (HCC), HLD (hyperlipidemia), Hypertension, Low blood potassium, NSTEMI (non-ST elevated myocardial infarction) (HCC), PONV (postoperative nausea and vomiting), and Valvular heart disease.  PSH:    Past Surgical History:  Procedure Laterality Date   BACK SURGERY     CARDIAC CATHETERIZATION N/A 09/23/2015   Procedure: Right  and Left Heart Cath;  Surgeon: Antonieta Iba, MD;  Location: [facility] INVASIVE CV LAB;  Service: Cardiovascular;  Laterality: N/A;   CARDIAC CATHETERIZATION N/A 09/23/2015   Procedure: Coronary Stent Intervention;  Surgeon: Alwyn Pea, MD;  Location: [facility] INVASIVE CV LAB;  Service: Cardiovascular;  Laterality: N/A;   CATARACT EXTRACTION W/PHACO Left 04/26/2017    Procedure: CATARACT EXTRACTION PHACO AND INTRAOCULAR LENS PLACEMENT (IOC) LEFT DIABETIC;  Surgeon: Lockie Mola, MD;  Location: [facility];  Service: Ophthalmology;  Laterality: Left;  diabetic-oral meds   CATARACT EXTRACTION W/PHACO Right 05/31/2017   Procedure: CATARACT EXTRACTION PHACO AND INTRAOCULAR LENS PLACEMENT (IOC) RIGHT DIABETIC;  Surgeon: Lockie Mola, MD;  Location: [facility];  Service: Ophthalmology;  Laterality: Right;   CHOLECYSTECTOMY N/A 06/16/2016   Procedure: LAPAROSCOPIC CHOLECYSTECTOMY WITH INTRAOPERATIVE CHOLANGIOGRAM;  Surgeon: Nadeen Landau, MD;  Location: [facility] ORS;  Service: General;  Laterality: N/A;   COLONOSCOPY WITH PROPOFOL N/A 06/26/2022   Procedure: COLONOSCOPY WITH PROPOFOL;  Surgeon: Wyline Mood, MD;  Location: [facility] ENDOSCOPY;  Service: Gastroenterology;  Laterality: N/A;   CORONARY STENT PLACEMENT     ESOPHAGOGASTRODUODENOSCOPY N/A 06/30/2022   Procedure: ESOPHAGOGASTRODUODENOSCOPY (EGD);  Surgeon: Toney Reil, MD;  Location: [facility] ENDOSCOPY;  Service: Gastroenterology;  Laterality: N/A;   ESOPHAGOGASTRODUODENOSCOPY (EGD) WITH PROPOFOL N/A 06/26/2022   Procedure: ESOPHAGOGASTRODUODENOSCOPY (EGD) WITH PROPOFOL;  Surgeon: Wyline Mood, MD;  Location: [facility] ENDOSCOPY;  Service: Gastroenterology;  Laterality: N/A;   GIVENS CAPSULE STUDY N/A 06/30/2022   Procedure: GIVENS CAPSULE STUDY;  Surgeon: Toney Reil, MD;  Location: [facility] ENDOSCOPY;  Service: Gastroenterology;  Laterality: N/A;  PLACE CAPSULE DURING EGD   JOINT REPLACEMENT     LUMBAR FUSION     TOTAL HIP ARTHROPLASTY      Current Outpatient Medications  Medication Sig Dispense Refill   aspirin EC 81 MG tablet Take 81 mg by mouth daily.     Cholecalciferol (VITAMIN D3) 2000 units TABS Take 2,000 Units by mouth daily.     ferrous gluconate (FERGON) 324 MG tablet Take 1 tablet (324 mg total) by mouth daily with breakfast. 60 tablet 3   furosemide (LASIX)  40 MG tablet Take 1 tablet (40 mg total) by mouth daily. 90 tablet 3   midodrine (PROAMATINE) 5 MG tablet Take 5 mg by mouth 3 (three) times daily with meals as needed.     Multiple Vitamins-Minerals (MULTIVITAMIN WITH MINERALS) tablet Take 1 tablet by mouth daily. 120 tablet 2   pantoprazole (PROTONIX) 40 MG tablet Take 1 tablet (40 mg total) by mouth daily. 30 tablet 1   rosuvastatin (CRESTOR) 40 MG tablet Take 1 tablet (40 mg total) by mouth daily. 90 tablet 3   spironolactone (ALDACTONE) 25 MG tablet Take 1 tablet (25 mg total) by mouth daily. 30 tablet 5   TRULICITY 1.5 MG/0.5ML SOPN INJECT 1.5 MG SUBCUTANEOUSLY EVERY 7 (SEVEN) DAYS     No current facility-administered medications for this visit.    Allergies:   Cephalexin, Hydrocodone-acetaminophen, Oxycodone-acetaminophen, Percocet [oxycodone-acetaminophen], Vicodin [hydrocodone-acetaminophen], Ace inhibitors, Ferrous sulfate, and Penicillins   Social History:  The patient  reports that she has never smoked. She has never used smokeless tobacco. She reports that she does not drink alcohol and does not use drugs.   Family History:   family history includes Cancer in her maternal grandfather; Diabetes in her mother; Heart disease in her father; Hypertension in her mother.    Review of Systems: Review of Systems  Constitutional: Negative.   HENT:  Negative.    Respiratory: Negative.    Cardiovascular:  Positive for leg swelling.  Gastrointestinal: Negative.   Musculoskeletal: Negative.   Neurological: Negative.   Psychiatric/Behavioral: Negative.    All other systems reviewed and are negative.   PHYSICAL EXAM: VS:  BP 120/60 (BP Location: Left Arm, Patient Position: Sitting, Cuff Size: Normal)   Pulse 91   Ht 5\' 4"  (1.626 m)   Wt 228 lb (103.4 kg)   SpO2 97%   BMI 39.14 kg/m  , BMI Body mass index is 39.14 kg/m. Constitutional:  oriented to person, place, and time. No distress.  HENT:  Head: Grossly normal Eyes:  no  discharge. No scleral icterus.  Neck: No JVD, no carotid bruits  Cardiovascular: Regular rate and rhythm, no murmurs appreciated Pulmonary/Chest: Clear to auscultation bilaterally, no wheezes or rails Abdominal: Soft.  no distension.  no tenderness.  Musculoskeletal: Normal range of motion Neurological:  normal muscle tone. Coordination normal. No atrophy Skin: Skin warm and dry Psychiatric: normal affect, pleasant  Recent Labs: 11/10/2022: ALT 13 11/11/2022: TSH 4.930 11/12/2022: B Natriuretic Peptide 441.9 11/15/2022: BUN 48; Creatinine, Ser 2.30; Magnesium 2.4; Potassium 3.7; Sodium 139 12/22/2022: Hemoglobin 14.0; Platelet Count 225    Lipid Panel Lab Results  Component Value Date   CHOL 174 09/24/2015   HDL 48 09/24/2015   LDLCALC 105 (H) 09/24/2015   TRIG 107 09/24/2015      Wt Readings from Last 3 Encounters:  03/03/23 228 lb (103.4 kg)  12/27/22 226 lb 14.4 oz (102.9 kg)  11/15/22 264 lb 8.8 oz (120 kg)      ASSESSMENT AND PLAN:  Coronary artery disease involving native coronary artery of native heart with unstable angina pectoris (HCC) - Plan: EKG 12-Lead Currently with no symptoms of angina. No further workup at this time. Continue current medication regimen.  Mixed hyperlipidemia - Plan: EKG 12-Lead Recommend compliance with her Crestor Prior numbers at goal  Essential hypertension - Plan: EKG 12-Lead Blood pressure is well controlled on today's visit. No changes made to the medications. Many of her blood pressure medications held at the time of discharge May 2024, no plan to restart given blood pressure well-controlled off triamterene HCTZ, off metoprolol, off Cardura  Type 2 diabetes mellitus with other circulatory complication, without long-term current use of insulin (HCC) -  Followed by Dr. Hyacinth Meeker, previously in the 6 range Calorie restriction recommended  Morbid obesity due to excess calories (HCC) - Plan: EKG 12-Lead Calorie restriction and walking  program recommended  Chronic diastolic CHF with pulmonary HTN Exacerbated by morbid obesity, Very sedentary, high fluid intake, some restaurant food Continue Lasix 40 daily, extra Lasix after lunch for worsening leg swelling    Total encounter time more than 30 minutes  Greater than 50% was spent in counseling and coordination of care with the patient   Orders Placed This Encounter  Procedures   EKG 12-Lead     Signed, Dossie Arbour, M.D., Ph.D. 03/03/2023  [facility] [place], [place] [phone]

## 2023-03-03 ENCOUNTER — Ambulatory Visit: Payer: Medicare HMO | Admitting: Cardiovascular Disease

## 2023-03-03 ENCOUNTER — Encounter: Payer: Self-pay | Admitting: Cardiovascular Disease

## 2023-03-03 VITALS — BP 120/60 | HR 91 | Ht 64.0 in | Wt 228.0 lb

## 2023-03-03 DIAGNOSIS — R0602 Shortness of breath: Secondary | ICD-10-CM | POA: Diagnosis not present

## 2023-03-03 DIAGNOSIS — I272 Pulmonary hypertension, unspecified: Secondary | ICD-10-CM | POA: Diagnosis not present

## 2023-03-03 DIAGNOSIS — I10 Essential (primary) hypertension: Secondary | ICD-10-CM

## 2023-03-03 DIAGNOSIS — E1122 Type 2 diabetes mellitus with diabetic chronic kidney disease: Secondary | ICD-10-CM | POA: Diagnosis not present

## 2023-03-03 DIAGNOSIS — I351 Nonrheumatic aortic (valve) insufficiency: Secondary | ICD-10-CM

## 2023-03-03 DIAGNOSIS — I25118 Atherosclerotic heart disease of native coronary artery with other forms of angina pectoris: Secondary | ICD-10-CM

## 2023-03-03 DIAGNOSIS — I5032 Chronic diastolic (congestive) heart failure: Secondary | ICD-10-CM | POA: Diagnosis not present

## 2023-03-03 DIAGNOSIS — E785 Hyperlipidemia, unspecified: Secondary | ICD-10-CM | POA: Diagnosis not present

## 2023-03-03 DIAGNOSIS — N184 Chronic kidney disease, stage 4 (severe): Secondary | ICD-10-CM

## 2023-03-03 MED ORDER — FUROSEMIDE 40 MG PO TABS: 40.0000 mg | ORAL_TABLET | Freq: Every day | ORAL | 3 refills | Status: DC

## 2023-03-03 NOTE — Patient Instructions (Addendum)
Medication Instructions:  Continue lasix 40 daily,  extra lasix 40 mg after lunch for worsening leg swelling No more then 2 extra lasix a week *If you need a refill on your cardiac medications before your next appointment, please call your pharmacy*   Lab Work: None ordered  If you have labs (blood work) drawn today and your tests are completely normal, you will receive your results only by: MyChart Message (if you have MyChart) OR A paper copy in the mail If you have any lab test that is abnormal or we need to change your treatment, we will call you to review the results.   Testing/Procedures: None ordered   Follow-Up: At [facility], you and your health needs are our priority.  As part of our continuing mission to provide you with exceptional heart care, we have created designated Provider Care Teams.  These Care Teams include your primary Cardiologist (physician) and Advanced Practice Providers (APPs -  Physician Assistants and Nurse Practitioners) who all work together to provide you with the care you need, when you need it.  We recommend signing up for the patient portal called "MyChart".  Sign up information is provided on this After Visit Summary.  MyChart is used to connect with patients for Virtual Visits (Telemedicine).  Patients are able to view lab/test results, encounter notes, upcoming appointments, etc.  Non-urgent messages can be sent to your provider as well.   To learn more about what you can do with MyChart, go to [email or web address].    Your next appointment:   6 month(s)  Provider:   You may see Julien Nordmann, MD or one of the following Advanced Practice Providers on your designated Care Team:   Nicolasa Ducking, NP Eula Listen, PA-C Cadence Fransico Michael, PA-C Charlsie Quest, NP    Other Instructions  Compression hose

## 2023-03-04 ENCOUNTER — Other Ambulatory Visit: Payer: Self-pay | Admitting: Cardiovascular Disease

## 2023-03-15 DIAGNOSIS — E1169 Type 2 diabetes mellitus with other specified complication: Secondary | ICD-10-CM | POA: Diagnosis not present

## 2023-03-15 DIAGNOSIS — E782 Mixed hyperlipidemia: Secondary | ICD-10-CM | POA: Diagnosis not present

## 2023-03-15 DIAGNOSIS — D500 Iron deficiency anemia secondary to blood loss (chronic): Secondary | ICD-10-CM | POA: Diagnosis not present

## 2023-03-15 DIAGNOSIS — E538 Deficiency of other specified B group vitamins: Secondary | ICD-10-CM | POA: Diagnosis not present

## 2023-03-22 DIAGNOSIS — N184 Chronic kidney disease, stage 4 (severe): Secondary | ICD-10-CM | POA: Diagnosis not present

## 2023-03-22 DIAGNOSIS — E1122 Type 2 diabetes mellitus with diabetic chronic kidney disease: Secondary | ICD-10-CM | POA: Diagnosis not present

## 2023-03-22 DIAGNOSIS — Z23 Encounter for immunization: Secondary | ICD-10-CM | POA: Diagnosis not present

## 2023-03-22 DIAGNOSIS — I130 Hypertensive heart and chronic kidney disease with heart failure and stage 1 through stage 4 chronic kidney disease, or unspecified chronic kidney disease: Secondary | ICD-10-CM | POA: Diagnosis not present

## 2023-03-22 DIAGNOSIS — I5030 Unspecified diastolic (congestive) heart failure: Secondary | ICD-10-CM | POA: Diagnosis not present

## 2023-03-22 DIAGNOSIS — K7581 Nonalcoholic steatohepatitis (NASH): Secondary | ICD-10-CM | POA: Diagnosis not present

## 2023-04-11 DIAGNOSIS — E113313 Type 2 diabetes mellitus with moderate nonproliferative diabetic retinopathy with macular edema, bilateral: Secondary | ICD-10-CM | POA: Diagnosis not present

## 2023-04-11 DIAGNOSIS — Z961 Presence of intraocular lens: Secondary | ICD-10-CM | POA: Diagnosis not present

## 2023-04-13 ENCOUNTER — Other Ambulatory Visit: Payer: Self-pay | Admitting: Cardiovascular Disease

## 2023-04-13 DIAGNOSIS — I2510 Atherosclerotic heart disease of native coronary artery without angina pectoris: Secondary | ICD-10-CM

## 2023-04-19 DIAGNOSIS — N184 Chronic kidney disease, stage 4 (severe): Secondary | ICD-10-CM | POA: Diagnosis not present

## 2023-04-24 ENCOUNTER — Inpatient Hospital Stay: Payer: Medicare HMO | Attending: Oncology

## 2023-04-24 DIAGNOSIS — N183 Chronic kidney disease, stage 3 unspecified: Secondary | ICD-10-CM | POA: Insufficient documentation

## 2023-04-24 DIAGNOSIS — D631 Anemia in chronic kidney disease: Secondary | ICD-10-CM | POA: Diagnosis not present

## 2023-04-24 DIAGNOSIS — D500 Iron deficiency anemia secondary to blood loss (chronic): Secondary | ICD-10-CM

## 2023-04-24 LAB — CBC WITH DIFFERENTIAL (CANCER CENTER ONLY)
Abs Immature Granulocytes: 0.01 10*3/uL (ref 0.00–0.07)
Basophils Absolute: 0.1 10*3/uL (ref 0.0–0.1)
Basophils Relative: 1 %
Eosinophils Absolute: 0.2 10*3/uL (ref 0.0–0.5)
Eosinophils Relative: 4 %
HCT: 44 % (ref 36.0–46.0)
Hemoglobin: 14.3 g/dL (ref 12.0–15.0)
Immature Granulocytes: 0 %
Lymphocytes Relative: 10 %
Lymphs Abs: 0.7 10*3/uL (ref 0.7–4.0)
MCH: 33.2 pg (ref 26.0–34.0)
MCHC: 32.5 g/dL (ref 30.0–36.0)
MCV: 102.1 fL — ABNORMAL HIGH (ref 80.0–100.0)
Monocytes Absolute: 0.5 10*3/uL (ref 0.1–1.0)
Monocytes Relative: 7 %
Neutro Abs: 5 10*3/uL (ref 1.7–7.7)
Neutrophils Relative %: 78 %
Platelet Count: 238 10*3/uL (ref 150–400)
RBC: 4.31 MIL/uL (ref 3.87–5.11)
RDW: 13.5 % (ref 11.5–15.5)
WBC Count: 6.4 10*3/uL (ref 4.0–10.5)
nRBC: 0 % (ref 0.0–0.2)

## 2023-04-24 LAB — IRON AND TIBC
Iron: 72 ug/dL (ref 28–170)
Saturation Ratios: 25 % (ref 10.4–31.8)
TIBC: 293 ug/dL (ref 250–450)
UIBC: 221 ug/dL

## 2023-04-24 LAB — FERRITIN: Ferritin: 64 ng/mL (ref 11–307)

## 2023-04-26 ENCOUNTER — Inpatient Hospital Stay (HOSPITAL_BASED_OUTPATIENT_CLINIC_OR_DEPARTMENT_OTHER): Payer: Medicare HMO | Admitting: Oncology

## 2023-04-26 ENCOUNTER — Encounter: Payer: Self-pay | Admitting: Oncology

## 2023-04-26 ENCOUNTER — Inpatient Hospital Stay: Payer: Medicare HMO

## 2023-04-26 VITALS — BP 95/67 | HR 92 | Temp 98.4°F | Resp 18 | Wt 229.8 lb

## 2023-04-26 DIAGNOSIS — D631 Anemia in chronic kidney disease: Secondary | ICD-10-CM

## 2023-04-26 DIAGNOSIS — N183 Chronic kidney disease, stage 3 unspecified: Secondary | ICD-10-CM | POA: Diagnosis not present

## 2023-04-26 DIAGNOSIS — N184 Chronic kidney disease, stage 4 (severe): Secondary | ICD-10-CM

## 2023-04-26 DIAGNOSIS — D500 Iron deficiency anemia secondary to blood loss (chronic): Secondary | ICD-10-CM | POA: Diagnosis not present

## 2023-04-26 NOTE — Assessment & Plan Note (Addendum)
Labs reviewed and discussed with patient. severe iron deficiency anemia. Suspect chronic occult blood loss from GI tract.  Patient has extensive GI workup. S/p IV Venofer, Labs are reviewed and discussed with patient. Lab Results  Component Value Date   HGB 14.3 04/24/2023   TIBC 293 04/24/2023   IRONPCTSAT 25 04/24/2023   FERRITIN 64 04/24/2023      Hold off IV Venofer treatments today. Continue oral iron supplementation daily.

## 2023-04-26 NOTE — Assessment & Plan Note (Signed)
Continue close monitor.   No need for EPO replacement.

## 2023-04-26 NOTE — Progress Notes (Signed)
Hematology/Oncology Progress note [phone] [phone]      CHIEF COMPLAINTS/REASON FOR VISIT:  Iron deficiency anemia  ASSESSMENT & PLAN:   IDA (iron deficiency anemia) Labs reviewed and discussed with patient. severe iron deficiency anemia. Suspect chronic occult blood loss from GI tract.  Patient has extensive GI workup. S/p IV Venofer, Labs are reviewed and discussed with patient. Lab Results  Component Value Date   HGB 14.3 04/24/2023   TIBC 293 04/24/2023   IRONPCTSAT 25 04/24/2023   FERRITIN 64 04/24/2023      Hold off IV Venofer treatments today. Continue oral iron supplementation daily.      Anemia in chronic kidney disease (CKD) Continue close monitor.   No need for EPO replacement.    Orders Placed This Encounter  Procedures   CBC with Differential (Cancer Center Only)    Standing Status:   Future    Standing Expiration Date:   04/25/2024   Iron and TIBC    Standing Status:   Future    Standing Expiration Date:   04/25/2024   Ferritin    Standing Status:   Future    Standing Expiration Date:   04/25/2024   Retic Panel    Standing Status:   Future    Standing Expiration Date:   04/25/2024   Follow-up in 4 months. All questions were answered. The patient knows to call the clinic with any problems, questions or concerns.  Rickard Patience, MD, PhD [facility] Hematology Oncology 04/26/2023     HISTORY OF PRESENTING ILLNESS:  Melissa Christian is a  82 y.o.  female with PMH listed below who was referred to me for anemia Reviewed patient's recent labs that was done.  She was found to have abnormal CBC on 02/22/2022, with a hemoglobin of 8.4, MCV 83.5.  B12 adequate, patient was advised to take ferrous sulfate 325 mg twice daily.  She had a repeat hemoglobin 03/31/2022 which was 7.9, ferritin 9.  Reviewed patient's previous labs ordered by primary care physician's office, anemia is chronic onset , since early 2023. Patient reports  feeling tired.  No energy. She denies recent chest pain on exertion, pre-syncopal episodes, or palpitations She had not noticed any recent bleeding such as epistaxis, hematuria or hematochezia.  She denies over the counter NSAID ingestion. She is on antiplatelets agent-aspirin 81 mg. Continue had a colonoscopy. She was accompanied by her sister.  INTERVAL HISTORY Melissa Christian is a 82 y.o. female who has above history reviewed by me today presents for follow up visit for iron deficiency anemia.  S/p IV venofer.  Patient reports fatigue level has improved. During the interval, patient establish with gastroenterology and had GI workup. 06/26/2022 colonoscopy showed entire examined colon is normal.  Examined portion of ileum was normal. Upper endoscopy showed normal esophagus.  A few gastric polyps.  A single duodenal polyp biopsied.-Pathology showed duodenum tubular adenoma 07/06/2022 capsule study showed duodenal polyp, otherwise normal study.  INTERVAL HISTORY Melissa Christian is a 82 y.o. female who has above history reviewed by me today presents for follow up visit for anemia.  Patient reports feeling well. No new complaints   MEDICAL HISTORY:  Past Medical History:  Diagnosis Date   Anemia    CAD (coronary artery disease)    a. NSTEMI 09/2015: cardiac cath: LM no obs dz, mLAD 95% s/p PCI/DES 0%, LCx no obs, RCA no obs, LVEF 55-65%   Chronic kidney disease (CKD), stage III (moderate) (HCC)  Diabetes mellitus with complication (HCC)    HLD (hyperlipidemia)    Hypertension    Low blood potassium    NSTEMI (non-ST elevated myocardial infarction) (HCC)    PONV (postoperative nausea and vomiting)    Valvular heart disease    a. 09/2015: EF 55-60%, challenging images unable to exclude mild mid-distal anterior to apical HK, nl LV dias fxn, mild AI/MR, normal size of left atrium, RV systolic function normal, PASP normal      SURGICAL HISTORY: Past Surgical History:  Procedure  Laterality Date   BACK SURGERY     CARDIAC CATHETERIZATION N/A 09/23/2015   Procedure: Right and Left Heart Cath;  Surgeon: Antonieta Iba, MD;  Location: [facility] INVASIVE CV LAB;  Service: Cardiovascular;  Laterality: N/A;   CARDIAC CATHETERIZATION N/A 09/23/2015   Procedure: Coronary Stent Intervention;  Surgeon: Alwyn Pea, MD;  Location: [facility] INVASIVE CV LAB;  Service: Cardiovascular;  Laterality: N/A;   CATARACT EXTRACTION W/PHACO Left 04/26/2017   Procedure: CATARACT EXTRACTION PHACO AND INTRAOCULAR LENS PLACEMENT (IOC) LEFT DIABETIC;  Surgeon: Lockie Mola, MD;  Location: [facility];  Service: Ophthalmology;  Laterality: Left;  diabetic-oral meds   CATARACT EXTRACTION W/PHACO Right 05/31/2017   Procedure: CATARACT EXTRACTION PHACO AND INTRAOCULAR LENS PLACEMENT (IOC) RIGHT DIABETIC;  Surgeon: Lockie Mola, MD;  Location: [facility];  Service: Ophthalmology;  Laterality: Right;   CHOLECYSTECTOMY N/A 06/16/2016   Procedure: LAPAROSCOPIC CHOLECYSTECTOMY WITH INTRAOPERATIVE CHOLANGIOGRAM;  Surgeon: Nadeen Landau, MD;  Location: [facility] ORS;  Service: General;  Laterality: N/A;   COLONOSCOPY WITH PROPOFOL N/A 06/26/2022   Procedure: COLONOSCOPY WITH PROPOFOL;  Surgeon: Wyline Mood, MD;  Location: [facility] ENDOSCOPY;  Service: Gastroenterology;  Laterality: N/A;   CORONARY STENT PLACEMENT     ESOPHAGOGASTRODUODENOSCOPY N/A 06/30/2022   Procedure: ESOPHAGOGASTRODUODENOSCOPY (EGD);  Surgeon: Toney Reil, MD;  Location: [facility] ENDOSCOPY;  Service: Gastroenterology;  Laterality: N/A;   ESOPHAGOGASTRODUODENOSCOPY (EGD) WITH PROPOFOL N/A 06/26/2022   Procedure: ESOPHAGOGASTRODUODENOSCOPY (EGD) WITH PROPOFOL;  Surgeon: Wyline Mood, MD;  Location: [facility] ENDOSCOPY;  Service: Gastroenterology;  Laterality: N/A;   GIVENS CAPSULE STUDY N/A 06/30/2022   Procedure: GIVENS CAPSULE STUDY;  Surgeon: Toney Reil, MD;  Location: [facility] ENDOSCOPY;  Service:  Gastroenterology;  Laterality: N/A;  PLACE CAPSULE DURING EGD   JOINT REPLACEMENT     LUMBAR FUSION     TOTAL HIP ARTHROPLASTY      SOCIAL HISTORY: Social History   Socioeconomic History   Marital status: Widowed    Spouse name: Not on file   Number of children: Not on file   Years of education: Not on file   Highest education level: Not on file  Occupational History   Not on file  Tobacco Use   Smoking status: Never   Smokeless tobacco: Never  Vaping Use   Vaping status: Never Used  Substance and Sexual Activity   Alcohol use: No    Alcohol/week: 0.0 standard drinks of alcohol   Drug use: No   Sexual activity: Not Currently    Birth control/protection: None, Post-menopausal  Other Topics Concern   Not on file  Social History Narrative   Not on file   Social Determinants of Health   Financial Resource Strain: Low Risk  (03/22/2023)   Received from [facility]   Overall Financial Resource Strain (CARDIA)    Difficulty of Paying Living Expenses: Not hard at all  Food Insecurity: No Food Insecurity (03/22/2023)   Received from [facility]  Hunger Vital Sign    Worried About Running Out of Food in the Last Year: Never true    Ran Out of Food in the Last Year: Never true  Transportation Needs: No Transportation Needs (03/22/2023)   Received from [facility] - Transportation    In the past 12 months, has lack of transportation kept you from medical appointments or from getting medications?: No    Lack of Transportation (Non-Medical): No  Physical Activity: Inactive (09/28/2022)   Received from [facility], [facility]   Exercise Vital Sign    Days of Exercise per Week: 0 days    Minutes of Exercise per Session: 0 min  Stress: Not on file  Social Connections: Moderately Isolated (09/28/2022)   Received from [facility], [facility]    Social Connection and Isolation Panel [NHANES]    Frequency of Communication with Friends and Family: More than three times a week    Frequency of Social Gatherings with Friends and Family: More than three times a week    Attends Religious Services: More than 4 times per year    Active Member of [employer] or Organizations: No    Attends Banker Meetings: Never    Marital Status: Widowed  Intimate Partner Violence: Not At Risk (11/12/2022)   Humiliation, Afraid, Rape, and Kick questionnaire    Fear of Current or Ex-Partner: No    Emotionally Abused: No    Physically Abused: No    Sexually Abused: No    FAMILY HISTORY: Family History  Problem Relation Age of Onset   Diabetes Mother    Hypertension Mother    Heart disease Father    Cancer Maternal Grandfather     ALLERGIES:  is allergic to cephalexin, hydrocodone-acetaminophen, oxycodone-acetaminophen, percocet [oxycodone-acetaminophen], vicodin [hydrocodone-acetaminophen], ace inhibitors, ferrous sulfate, and penicillins.  MEDICATIONS:  Current Outpatient Medications  Medication Sig Dispense Refill   aspirin EC 81 MG tablet Take 81 mg by mouth daily.     Cholecalciferol (VITAMIN D3) 2000 units TABS Take 2,000 Units by mouth daily.     empagliflozin (JARDIANCE) 10 MG TABS tablet Take 1 tablet by mouth daily.     ferrous gluconate (FERGON) 324 MG tablet Take 1 tablet (324 mg total) by mouth daily with breakfast. 60 tablet 3   furosemide (LASIX) 40 MG tablet Take 1 tablet (40 mg total) by mouth daily. May take an extra 1 tablet ( 40 mg) in the afternoon as needed for left swelling. No more then 2 extra tablets a week. 360 tablet 1   midodrine (PROAMATINE) 5 MG tablet Take 5 mg by mouth 3 (three) times daily with meals as needed.     Multiple Vitamins-Minerals (MULTIVITAMIN WITH MINERALS) tablet Take 1 tablet by mouth daily. 120 tablet 2   pantoprazole (PROTONIX) 40 MG tablet Take 1 tablet (40 mg total) by mouth daily. 30 tablet  1   rosuvastatin (CRESTOR) 40 MG tablet Take 1 tablet (40 mg total) by mouth daily. 90 tablet 3   spironolactone (ALDACTONE) 25 MG tablet Take 1 tablet (25 mg total) by mouth daily. 30 tablet 5   TRULICITY 1.5 MG/0.5ML SOPN INJECT 1.5 MG SUBCUTANEOUSLY EVERY 7 (SEVEN) DAYS     No current facility-administered medications for this visit.    Review of Systems  Constitutional:  Positive for fatigue. Negative for appetite change, chills and fever.  HENT:   Negative for hearing loss and voice change.  Eyes:  Negative for eye problems.  Respiratory:  Negative for chest tightness and cough.   Cardiovascular:  Negative for chest pain.  Gastrointestinal:  Negative for abdominal distention, abdominal pain and blood in stool.  Endocrine: Negative for hot flashes.  Genitourinary:  Negative for difficulty urinating and frequency.   Musculoskeletal:  Negative for arthralgias.  Skin:  Negative for itching and rash.  Neurological:  Negative for extremity weakness.  Hematological:  Negative for adenopathy.  Psychiatric/Behavioral:  Negative for confusion.     PHYSICAL EXAMINATION: ECOG PERFORMANCE STATUS: 2 - Symptomatic, <50% confined to bed Vitals:   04/26/23 1422  BP: 95/67  Pulse: 92  Resp: 18  Temp: 98.4 F (36.9 C)  SpO2: 96%   Filed Weights   04/26/23 1422  Weight: 229 lb 12.8 oz (104.2 kg)     Physical Exam Constitutional:      General: She is not in acute distress.    Appearance: She is obese.     Comments: Patient sits in the wheelchair  HENT:     Head: Normocephalic and atraumatic.  Eyes:     General: No scleral icterus. Cardiovascular:     Rate and Rhythm: Normal rate and regular rhythm.     Heart sounds: Murmur heard.  Pulmonary:     Effort: Pulmonary effort is normal. No respiratory distress.  Abdominal:     General: There is no distension.  Musculoskeletal:        General: Normal range of motion.     Cervical back: Normal range of motion and neck supple.   Skin:    Findings: No erythema.  Neurological:     Mental Status: She is alert and oriented to person, place, and time. Mental status is at baseline.  Psychiatric:        Mood and Affect: Mood normal.      LABORATORY DATA:  I have reviewed the data as listed    Latest Ref Rng & Units 04/24/2023    9:19 AM 12/22/2022   12:37 PM 11/15/2022    4:11 AM  CBC  WBC 4.0 - 10.5 K/uL 6.4  6.7  4.9   Hemoglobin 12.0 - 15.0 g/dL 16.1  09.6  8.9   Hematocrit 36.0 - 46.0 % 44.0  45.3  29.3   Platelets 150 - 400 K/uL 238  225  210       Latest Ref Rng & Units 11/15/2022    4:11 AM 11/14/2022    4:25 AM 11/13/2022    5:30 AM  CMP  Glucose 70 - 99 mg/dL 045  92  409   BUN 8 - 23 mg/dL 48  49  55   Creatinine 0.44 - 1.00 mg/dL 8.11  9.14  7.82   Sodium 135 - 145 mmol/L 139  137  137   Potassium 3.5 - 5.1 mmol/L 3.7  3.7  3.4   Chloride 98 - 111 mmol/L 99  99  102   CO2 22 - 32 mmol/L 33  29  28   Calcium 8.9 - 10.3 mg/dL 8.3  8.3  8.3       Component Value Date/Time   IRON 72 04/24/2023 0919   TIBC 293 04/24/2023 0919   FERRITIN 64 04/24/2023 0919   IRONPCTSAT 25 04/24/2023 0919     RADIOGRAPHIC STUDIES: I have personally reviewed the radiological images as listed and agreed with the findings in the report. No results found.

## 2023-05-11 DIAGNOSIS — R600 Localized edema: Secondary | ICD-10-CM | POA: Diagnosis not present

## 2023-05-11 DIAGNOSIS — N2581 Secondary hyperparathyroidism of renal origin: Secondary | ICD-10-CM | POA: Diagnosis not present

## 2023-05-11 DIAGNOSIS — E1122 Type 2 diabetes mellitus with diabetic chronic kidney disease: Secondary | ICD-10-CM | POA: Diagnosis not present

## 2023-05-11 DIAGNOSIS — N184 Chronic kidney disease, stage 4 (severe): Secondary | ICD-10-CM | POA: Diagnosis not present

## 2023-07-19 DIAGNOSIS — N184 Chronic kidney disease, stage 4 (severe): Secondary | ICD-10-CM | POA: Diagnosis not present

## 2023-07-19 DIAGNOSIS — E782 Mixed hyperlipidemia: Secondary | ICD-10-CM | POA: Diagnosis not present

## 2023-07-19 DIAGNOSIS — E538 Deficiency of other specified B group vitamins: Secondary | ICD-10-CM | POA: Diagnosis not present

## 2023-07-19 DIAGNOSIS — K7581 Nonalcoholic steatohepatitis (NASH): Secondary | ICD-10-CM | POA: Diagnosis not present

## 2023-07-19 DIAGNOSIS — E1169 Type 2 diabetes mellitus with other specified complication: Secondary | ICD-10-CM | POA: Diagnosis not present

## 2023-08-08 DIAGNOSIS — G5602 Carpal tunnel syndrome, left upper limb: Secondary | ICD-10-CM | POA: Diagnosis not present

## 2023-08-08 DIAGNOSIS — Z6841 Body Mass Index (BMI) 40.0 and over, adult: Secondary | ICD-10-CM | POA: Diagnosis not present

## 2023-08-08 DIAGNOSIS — I5030 Unspecified diastolic (congestive) heart failure: Secondary | ICD-10-CM | POA: Diagnosis not present

## 2023-08-08 DIAGNOSIS — I272 Pulmonary hypertension, unspecified: Secondary | ICD-10-CM | POA: Diagnosis not present

## 2023-08-08 DIAGNOSIS — N184 Chronic kidney disease, stage 4 (severe): Secondary | ICD-10-CM | POA: Diagnosis not present

## 2023-08-08 DIAGNOSIS — E1122 Type 2 diabetes mellitus with diabetic chronic kidney disease: Secondary | ICD-10-CM | POA: Diagnosis not present

## 2023-08-29 DIAGNOSIS — N184 Chronic kidney disease, stage 4 (severe): Secondary | ICD-10-CM | POA: Diagnosis not present

## 2023-08-29 DIAGNOSIS — N2581 Secondary hyperparathyroidism of renal origin: Secondary | ICD-10-CM | POA: Diagnosis not present

## 2023-08-29 DIAGNOSIS — E1122 Type 2 diabetes mellitus with diabetic chronic kidney disease: Secondary | ICD-10-CM | POA: Diagnosis not present

## 2023-08-29 DIAGNOSIS — R809 Proteinuria, unspecified: Secondary | ICD-10-CM | POA: Diagnosis not present

## 2023-08-29 DIAGNOSIS — R600 Localized edema: Secondary | ICD-10-CM | POA: Diagnosis not present

## 2023-10-23 ENCOUNTER — Inpatient Hospital Stay: Payer: Medicare HMO | Attending: Oncology

## 2023-10-23 DIAGNOSIS — D509 Iron deficiency anemia, unspecified: Secondary | ICD-10-CM | POA: Diagnosis not present

## 2023-10-23 DIAGNOSIS — D500 Iron deficiency anemia secondary to blood loss (chronic): Secondary | ICD-10-CM

## 2023-10-23 DIAGNOSIS — D631 Anemia in chronic kidney disease: Secondary | ICD-10-CM | POA: Insufficient documentation

## 2023-10-23 DIAGNOSIS — Z79899 Other long term (current) drug therapy: Secondary | ICD-10-CM | POA: Diagnosis not present

## 2023-10-23 DIAGNOSIS — N189 Chronic kidney disease, unspecified: Secondary | ICD-10-CM | POA: Insufficient documentation

## 2023-10-23 LAB — RETIC PANEL
Immature Retic Fract: 7.4 % (ref 2.3–15.9)
RBC.: 4.4 MIL/uL (ref 3.87–5.11)
Retic Count, Absolute: 57.2 10*3/uL (ref 19.0–186.0)
Retic Ct Pct: 1.3 % (ref 0.4–3.1)
Reticulocyte Hemoglobin: 34.8 pg (ref >27.9)

## 2023-10-23 LAB — CBC WITH DIFFERENTIAL (CANCER CENTER ONLY)
Abs Immature Granulocytes: 0.03 10*3/uL (ref 0.00–0.07)
Basophils Absolute: 0.1 10*3/uL (ref 0.0–0.1)
Basophils Relative: 1 %
Eosinophils Absolute: 0.2 10*3/uL (ref 0.0–0.5)
Eosinophils Relative: 4 %
HCT: 45.3 % (ref 36.0–46.0)
Hemoglobin: 14.3 g/dL (ref 12.0–15.0)
Immature Granulocytes: 1 %
Lymphocytes Relative: 10 %
Lymphs Abs: 0.7 10*3/uL (ref 0.7–4.0)
MCH: 32.3 pg (ref 26.0–34.0)
MCHC: 31.6 g/dL (ref 30.0–36.0)
MCV: 102.3 fL — ABNORMAL HIGH (ref 80.0–100.0)
Monocytes Absolute: 0.4 10*3/uL (ref 0.1–1.0)
Monocytes Relative: 7 %
Neutro Abs: 5.2 10*3/uL (ref 1.7–7.7)
Neutrophils Relative %: 77 %
Platelet Count: 220 10*3/uL (ref 150–400)
RBC: 4.43 MIL/uL (ref 3.87–5.11)
RDW: 12.8 % (ref 11.5–15.5)
WBC Count: 6.7 10*3/uL (ref 4.0–10.5)
nRBC: 0 % (ref 0.0–0.2)

## 2023-10-23 LAB — IRON AND TIBC
Iron: 67 ug/dL (ref 28–170)
Saturation Ratios: 21 % (ref 10.4–31.8)
TIBC: 318 ug/dL (ref 250–450)
UIBC: 251 ug/dL

## 2023-10-23 LAB — FERRITIN: Ferritin: 45 ng/mL (ref 11–307)

## 2023-10-25 ENCOUNTER — Inpatient Hospital Stay (HOSPITAL_BASED_OUTPATIENT_CLINIC_OR_DEPARTMENT_OTHER): Payer: Medicare HMO | Admitting: Oncology

## 2023-10-25 ENCOUNTER — Encounter: Payer: Self-pay | Admitting: Oncology

## 2023-10-25 ENCOUNTER — Inpatient Hospital Stay: Payer: Medicare HMO

## 2023-10-25 VITALS — BP 141/55 | HR 66 | Temp 98.2°F | Resp 18 | Wt 247.0 lb

## 2023-10-25 DIAGNOSIS — N189 Chronic kidney disease, unspecified: Secondary | ICD-10-CM | POA: Diagnosis not present

## 2023-10-25 DIAGNOSIS — N184 Chronic kidney disease, stage 4 (severe): Secondary | ICD-10-CM

## 2023-10-25 DIAGNOSIS — D631 Anemia in chronic kidney disease: Secondary | ICD-10-CM | POA: Diagnosis not present

## 2023-10-25 DIAGNOSIS — D509 Iron deficiency anemia, unspecified: Secondary | ICD-10-CM | POA: Diagnosis not present

## 2023-10-25 DIAGNOSIS — Z79899 Other long term (current) drug therapy: Secondary | ICD-10-CM | POA: Diagnosis not present

## 2023-10-25 DIAGNOSIS — D500 Iron deficiency anemia secondary to blood loss (chronic): Secondary | ICD-10-CM

## 2023-10-25 NOTE — Progress Notes (Signed)
 No Venofer  toay, discharged

## 2023-10-25 NOTE — Assessment & Plan Note (Signed)
 Suspect chronic occult blood loss from GI tract.  Patient has extensive GI workup. Labs are reviewed and discussed with patient. Lab Results  Component Value Date   HGB 14.3 10/23/2023   TIBC 318 10/23/2023   IRONPCTSAT 21 10/23/2023   FERRITIN 45 10/23/2023      Hold off IV Venofer  treatments today. Continue oral iron  supplementation daily.

## 2023-10-25 NOTE — Progress Notes (Signed)
 Hematology/Oncology Progress note [phone] [phone]      CHIEF COMPLAINTS/REASON FOR VISIT:  Iron  deficiency anemia, anemia due to CKD  ASSESSMENT & PLAN:   IDA (iron  deficiency anemia) Suspect chronic occult blood loss from GI tract.  Patient has extensive GI workup. Labs are reviewed and discussed with patient. Lab Results  Component Value Date   HGB 14.3 10/23/2023   TIBC 318 10/23/2023   IRONPCTSAT 21 10/23/2023   FERRITIN 45 10/23/2023      Hold off IV Venofer  treatments today. Continue oral iron  supplementation daily.      Anemia in chronic kidney disease (CKD) Continue close monitor.   No need for EPO replacement. Continue oral supplementation    Orders Placed This Encounter  Procedures   CBC with Differential (Cancer Center Only)    Standing Status:   Future    Expected Date:   04/25/2024    Expiration Date:   10/24/2024   Iron  and TIBC    Standing Status:   Future    Expected Date:   04/25/2024    Expiration Date:   10/24/2024   Ferritin    Standing Status:   Future    Expected Date:   04/25/2024    Expiration Date:   10/24/2024   Retic Panel    Standing Status:   Future    Expected Date:   04/25/2024    Expiration Date:   10/24/2024   Follow-up in 6 months. All questions were answered. The patient knows to call the clinic with any problems, questions or concerns.  Timmy Forbes, MD, PhD [facility] Hematology Oncology 10/25/2023     HISTORY OF PRESENTING ILLNESS:  Melissa Christian is a  83 y.o.  female with PMH listed below who was referred to me for anemia Reviewed patient's recent labs that was done.  She was found to have abnormal CBC on 02/22/2022, with a hemoglobin of 8.4, MCV 83.5.  B12 adequate, patient was advised to take ferrous sulfate 325 mg twice daily.  She had a repeat hemoglobin 03/31/2022 which was 7.9, ferritin 9.  Reviewed patient's previous labs ordered by primary care physician's office, anemia is chronic  onset , since early 2023. Patient reports feeling tired.  No energy. She denies recent chest pain on exertion, pre-syncopal episodes, or palpitations She had not noticed any recent bleeding such as epistaxis, hematuria or hematochezia.  She denies over the counter NSAID ingestion. She is on antiplatelets agent-aspirin  81 mg. Continue had a colonoscopy. She was accompanied by her sister.  INTERVAL HISTORY Melissa Christian is a 83 y.o. female who has above history reviewed by me today presents for follow up visit for iron  deficiency anemia.  S/p IV venofer .  Patient reports fatigue level has improved. During the interval, patient establish with gastroenterology and had GI workup. 06/26/2022 colonoscopy showed entire examined colon is normal.  Examined portion of ileum was normal. Upper endoscopy showed normal esophagus.  A few gastric polyps.  A single duodenal polyp biopsied.-Pathology showed duodenum tubular adenoma 07/06/2022 capsule study showed duodenal polyp, otherwise normal study.  INTERVAL HISTORY Melissa Christian is a 83 y.o. female who has above history reviewed by me today presents for follow up visit for anemia.  Patient reports feeling well. No new complaints Patient takes oral iron  supplementation daily.  MEDICAL HISTORY:  Past Medical History:  Diagnosis Date   Anemia    CAD (coronary artery disease)    a. NSTEMI 09/2015: cardiac cath: LM no obs  dz, mLAD 95% s/p PCI/DES 0%, LCx no obs, RCA no obs, LVEF 55-65%   Chronic kidney disease (CKD), stage III (moderate) (HCC)    Diabetes mellitus with complication (HCC)    HLD (hyperlipidemia)    Hypertension    Low blood potassium    NSTEMI (non-ST elevated myocardial infarction) (HCC)    PONV (postoperative nausea and vomiting)    Valvular heart disease    a. 09/2015: EF 55-60%, challenging images unable to exclude mild mid-distal anterior to apical HK, nl LV dias fxn, mild AI/MR, normal size of left atrium, RV systolic function  normal, PASP normal      SURGICAL HISTORY: Past Surgical History:  Procedure Laterality Date   BACK SURGERY     CARDIAC CATHETERIZATION N/A 09/23/2015   Procedure: Right and Left Heart Cath;  Surgeon: Devorah Fonder, MD;  Location: [facility] INVASIVE CV LAB;  Service: Cardiovascular;  Laterality: N/A;   CARDIAC CATHETERIZATION N/A 09/23/2015   Procedure: Coronary Stent Intervention;  Surgeon: Antonette Batters, MD;  Location: [facility] INVASIVE CV LAB;  Service: Cardiovascular;  Laterality: N/A;   CATARACT EXTRACTION W/PHACO Left 04/26/2017   Procedure: CATARACT EXTRACTION PHACO AND INTRAOCULAR LENS PLACEMENT (IOC) LEFT DIABETIC;  Surgeon: Annell Kidney, MD;  Location: [facility];  Service: Ophthalmology;  Laterality: Left;  diabetic-oral meds   CATARACT EXTRACTION W/PHACO Right 05/31/2017   Procedure: CATARACT EXTRACTION PHACO AND INTRAOCULAR LENS PLACEMENT (IOC) RIGHT DIABETIC;  Surgeon: Annell Kidney, MD;  Location: [facility];  Service: Ophthalmology;  Laterality: Right;   CHOLECYSTECTOMY N/A 06/16/2016   Procedure: LAPAROSCOPIC CHOLECYSTECTOMY WITH INTRAOPERATIVE CHOLANGIOGRAM;  Surgeon: Benancio Bracket, MD;  Location: [facility] ORS;  Service: General;  Laterality: N/A;   COLONOSCOPY WITH PROPOFOL  N/A 06/26/2022   Procedure: COLONOSCOPY WITH PROPOFOL ;  Surgeon: Luke Salaam, MD;  Location: [facility] ENDOSCOPY;  Service: Gastroenterology;  Laterality: N/A;   CORONARY STENT PLACEMENT     ESOPHAGOGASTRODUODENOSCOPY N/A 06/30/2022   Procedure: ESOPHAGOGASTRODUODENOSCOPY (EGD);  Surgeon: Selena Daily, MD;  Location: [facility] ENDOSCOPY;  Service: Gastroenterology;  Laterality: N/A;   ESOPHAGOGASTRODUODENOSCOPY (EGD) WITH PROPOFOL  N/A 06/26/2022   Procedure: ESOPHAGOGASTRODUODENOSCOPY (EGD) WITH PROPOFOL ;  Surgeon: Luke Salaam, MD;  Location: [facility] ENDOSCOPY;  Service: Gastroenterology;  Laterality: N/A;   GIVENS CAPSULE STUDY N/A 06/30/2022   Procedure: GIVENS CAPSULE STUDY;   Surgeon: Selena Daily, MD;  Location: [facility] ENDOSCOPY;  Service: Gastroenterology;  Laterality: N/A;  PLACE CAPSULE DURING EGD   JOINT REPLACEMENT     LUMBAR FUSION     TOTAL HIP ARTHROPLASTY      SOCIAL HISTORY: Social History   Socioeconomic History   Marital status: Widowed    Spouse name: Not on file   Number of children: Not on file   Years of education: Not on file   Highest education level: Not on file  Occupational History   Not on file  Tobacco Use   Smoking status: Never   Smokeless tobacco: Never  Vaping Use   Vaping status: Never Used  Substance and Sexual Activity   Alcohol use: No    Alcohol/week: 0.0 standard drinks of alcohol   Drug use: No   Sexual activity: Not Currently    Birth control/protection: None, Post-menopausal  Other Topics Concern   Not on file  Social History Narrative   Not on file   Social Drivers of Health   Financial Resource Strain: Low Risk  (03/22/2023)   Received from [facility]   Overall Financial Resource Strain (CARDIA)  Difficulty of Paying Living Expenses: Not hard at all  Food Insecurity: No Food Insecurity (03/22/2023)   Received from [facility]   Hunger Vital Sign    Worried About Running Out of Food in the Last Year: Never true    Ran Out of Food in the Last Year: Never true  Transportation Needs: No Transportation Needs (03/22/2023)   Received from [facility]   PRAPARE - Transportation    Lack of Transportation (Non-Medical): No    In the past 12 months, has lack of transportation kept you from medical appointments or from getting medications?: No  Physical Activity: Inactive (09/28/2022)   Received from [facility], [facility]   Exercise Vital Sign    Days of Exercise per Week: 0 days    Minutes of Exercise per Session: 0 min  Stress: Not on file  Social Connections: Moderately Isolated (09/28/2022)   Received from  [facility], [facility]   Social Connection and Isolation Panel [NHANES]    Frequency of Communication with Friends and Family: More than three times a week    Frequency of Social Gatherings with Friends and Family: More than three times a week    Attends Religious Services: More than 4 times per year    Active Member of [employer] or Organizations: No    Attends Banker Meetings: Never    Marital Status: Widowed  Intimate Partner Violence: Not At Risk (11/12/2022)   Humiliation, Afraid, Rape, and Kick questionnaire    Fear of Current or Ex-Partner: No    Emotionally Abused: No    Physically Abused: No    Sexually Abused: No    FAMILY HISTORY: Family History  Problem Relation Age of Onset   Diabetes Mother    Hypertension Mother    Heart disease Father    Cancer Maternal Grandfather     ALLERGIES:  is allergic to cephalexin, hydrocodone -acetaminophen , oxycodone-acetaminophen , percocet [oxycodone-acetaminophen ], vicodin [hydrocodone -acetaminophen ], ace inhibitors, ferrous sulfate, and penicillins.  MEDICATIONS:  Current Outpatient Medications  Medication Sig Dispense Refill   aspirin  EC 81 MG tablet Take 81 mg by mouth daily.     Cholecalciferol  (VITAMIN D3) 2000 units TABS Take 2,000 Units by mouth daily.     empagliflozin (JARDIANCE) 10 MG TABS tablet Take 1 tablet by mouth daily.     ferrous gluconate  (FERGON) 324 MG tablet Take 1 tablet (324 mg total) by mouth daily with breakfast. 60 tablet 3   furosemide  (LASIX ) 40 MG tablet Take 1 tablet (40 mg total) by mouth daily. May take an extra 1 tablet ( 40 mg) in the afternoon as needed for left swelling. No more then 2 extra tablets a week. 360 tablet 1   gabapentin (NEURONTIN) 100 MG capsule Take 100 mg by mouth daily.     midodrine  (PROAMATINE ) 5 MG tablet Take 5 mg by mouth 3 (three) times daily with meals as needed.     pantoprazole  (PROTONIX ) 40 MG tablet Take 1 tablet (40 mg total)  by mouth daily. 30 tablet 1   rosuvastatin  (CRESTOR ) 40 MG tablet Take 1 tablet (40 mg total) by mouth daily. 90 tablet 3   spironolactone  (ALDACTONE ) 25 MG tablet Take 1 tablet (25 mg total) by mouth daily. 30 tablet 5   TRULICITY 1.5 MG/0.5ML SOPN INJECT 1.5 MG SUBCUTANEOUSLY EVERY 7 (SEVEN) DAYS     No current facility-administered medications for this visit.    Review of Systems  Constitutional:  Positive for fatigue. Negative for appetite change, chills and fever.  HENT:   Negative for hearing loss and voice change.   Eyes:  Negative for eye problems.  Respiratory:  Negative for chest tightness and cough.   Cardiovascular:  Negative for chest pain.  Gastrointestinal:  Negative for abdominal distention, abdominal pain and blood in stool.  Endocrine: Negative for hot flashes.  Genitourinary:  Negative for difficulty urinating and frequency.   Musculoskeletal:  Negative for arthralgias.  Skin:  Negative for itching and rash.  Neurological:  Negative for extremity weakness.  Hematological:  Negative for adenopathy.  Psychiatric/Behavioral:  Negative for confusion.     PHYSICAL EXAMINATION: ECOG PERFORMANCE STATUS: 2 - Symptomatic, <50% confined to bed Vitals:   10/25/23 1401  BP: (!) 141/55  Pulse: 66  Resp: 18  Temp: 98.2 F (36.8 C)  SpO2: 93%   Filed Weights   10/25/23 1401  Weight: 247 lb (112 kg)     Physical Exam Constitutional:      General: She is not in acute distress.    Appearance: She is obese.     Comments: Patient sits in the wheelchair  HENT:     Head: Normocephalic and atraumatic.  Eyes:     General: No scleral icterus. Cardiovascular:     Rate and Rhythm: Normal rate and regular rhythm.     Heart sounds: Murmur heard.  Pulmonary:     Effort: Pulmonary effort is normal. No respiratory distress.  Abdominal:     General: There is no distension.  Musculoskeletal:        General: Normal range of motion.     Cervical back: Normal range of motion  and neck supple.  Skin:    Findings: No erythema.  Neurological:     Mental Status: She is alert and oriented to person, place, and time. Mental status is at baseline.  Psychiatric:        Mood and Affect: Mood normal.      LABORATORY DATA:  I have reviewed the data as listed    Latest Ref Rng & Units 10/23/2023    9:23 AM 04/24/2023    9:19 AM 12/22/2022   12:37 PM  CBC  WBC 4.0 - 10.5 K/uL 6.7  6.4  6.7   Hemoglobin 12.0 - 15.0 g/dL 16.1  09.6  04.5   Hematocrit 36.0 - 46.0 % 45.3  44.0  45.3   Platelets 150 - 400 K/uL 220  238  225       Latest Ref Rng & Units 11/15/2022    4:11 AM 11/14/2022    4:25 AM 11/13/2022    5:30 AM  CMP  Glucose 70 - 99 mg/dL 409  92  811   BUN 8 - 23 mg/dL 48  49  55   Creatinine 0.44 - 1.00 mg/dL 9.14  7.82  9.56   Sodium 135 - 145 mmol/L 139  137  137   Potassium 3.5 - 5.1 mmol/L 3.7  3.7  3.4   Chloride 98 - 111 mmol/L 99  99  102   CO2 22 - 32 mmol/L 33  29  28   Calcium  8.9 - 10.3 mg/dL 8.3  8.3  8.3       Component Value Date/Time   IRON  67 10/23/2023 0923   TIBC 318 10/23/2023 0923   FERRITIN 45 10/23/2023 0923   IRONPCTSAT 21 10/23/2023 0923     RADIOGRAPHIC STUDIES: I have personally reviewed the radiological images  as listed and agreed with the findings in the report. No results found.

## 2023-10-25 NOTE — Assessment & Plan Note (Signed)
 Continue close monitor.   No need for EPO replacement. Continue oral supplementation

## 2023-10-30 ENCOUNTER — Ambulatory Visit: Payer: Medicare HMO | Admitting: Cardiovascular Disease

## 2023-10-30 ENCOUNTER — Encounter: Payer: Self-pay | Admitting: Medical

## 2023-10-30 ENCOUNTER — Ambulatory Visit: Attending: Medical | Admitting: Medical

## 2023-10-30 VITALS — BP 150/68 | HR 67 | Ht 65.0 in | Wt 247.0 lb

## 2023-10-30 DIAGNOSIS — E1100 Type 2 diabetes mellitus with hyperosmolarity without nonketotic hyperglycemic-hyperosmolar coma (NKHHC): Secondary | ICD-10-CM

## 2023-10-30 DIAGNOSIS — I25118 Atherosclerotic heart disease of native coronary artery with other forms of angina pectoris: Secondary | ICD-10-CM

## 2023-10-30 DIAGNOSIS — E782 Mixed hyperlipidemia: Secondary | ICD-10-CM | POA: Diagnosis not present

## 2023-10-30 DIAGNOSIS — I089 Rheumatic multiple valve disease, unspecified: Secondary | ICD-10-CM | POA: Diagnosis not present

## 2023-10-30 DIAGNOSIS — I5032 Chronic diastolic (congestive) heart failure: Secondary | ICD-10-CM

## 2023-10-30 DIAGNOSIS — I10 Essential (primary) hypertension: Secondary | ICD-10-CM

## 2023-10-30 NOTE — Progress Notes (Signed)
 Cardiology Office Note:  .   Date:  10/30/2023  ID:  Melissa Christian, DOB 30-Jul-1940, MRN [id number] PCP: Sari Cunning, MD  [facility] Providers Cardiologist:  Belva Boyden, MD     History of Present Illness: .   Melissa Christian is a 83 y.o. female with a h/o CAD with NSTEMI 09/2015 with PCI/DES mLAD, CKD Stage 3, DM2, HTN, HLD, AI, MR who presents for 6 month follow-up.   Echocardiogram in 2017 showed EF of 55 to 60%.  Image quality was challenging, thus unable to exclude mild mid to distal anterior wall and apical hypokinesis.  It showed mild AI and MR.  Repeat echo in 2018 showed EF of 60 to 65%, no wall motion abnormality, grade 1 diastolic dysfunction, mild AI, mild MR, normal PASP.  Lexi scan Myoview  in 2019 for atypical chest discomfort was low risk.  Echo in 2020 showed EF 60 to 65%, grade 1 diastolic dysfunction, moderate TR, moderate MR.  Echo in February 2024 showed EF of 60 to 65%, mild LVH, grade 2 diastolic dysfunction, mild MR, mild to moderate mitral stenosis, mild to moderate TR, mild AI.  Patient was last seen in August 2024 and she was stable from a cardiac perspective.   Today, the patient reports she is overall doing well. She did have some pain with hand and gabapentin is helping with this. She denies any bleeding issues. She was doing iron  infusions. Most recent Hgb normal. She denies chest pain or shortness of breath. She has occasional right leg swelling. She may take an extra lasix  as needed. She denies lightheadedness, dizziness, or palpitations. She is not taking midodrine .    Studies Reviewed: Aaron Aas   EKG Interpretation Date/Time:  Monday October 30 2023 09:53:29 EDT Ventricular Rate:  67 PR Interval:  192 QRS Duration:  128 QT Interval:  428 QTC Calculation: 452 R Axis:   -71  Text Interpretation: Normal sinus rhythm Right bundle branch block Left anterior fascicular block Bifascicular block When compared with ECG of 03-Mar-2023 13:56, No significant  change was found Confirmed by Gennaro Khat, Azarius Lambson ([id number]) on 10/30/2023 9:57:26 AM    Echo 08/2022  1. Left ventricular ejection fraction, by estimation, is 60 to 65%. The  left ventricle has normal function. The left ventricle has no regional  wall motion abnormalities. There is mild left ventricular hypertrophy.  Left ventricular diastolic parameters  are consistent with Grade II diastolic dysfunction (pseudonormalization).   2. Right ventricular systolic function is normal. The right ventricular  size is normal.   3. HR 75 bpm. The mitral valve is normal in structure. Mild mitral valve  regurgitation. Mild to moderate mitral stenosis. The mean mitral valve  gradient is 6.0 mmHg.   4. Tricuspid valve regurgitation is mild to moderate.   5. The aortic valve is tricuspid. Aortic valve regurgitation is mild.  Aortic valve sclerosis/calcification is present, without any evidence of  aortic stenosis.   Echo 09/2018 1. The left ventricle has normal systolic function with an ejection  fraction of 60-65%. The cavity size was normal. Left ventricular diastolic  Doppler parameters are consistent with pseudonormalization.   2. The right ventricle has normal systolic function. The cavity was  normal. There is no increase in right ventricular wall thickness. Right  ventricular systolic pressure is moderately elevated with an estimated  pressure of 54.6 mmHg.   3. Tricuspid valve regurgitation is moderate.   Myoview  lexiscan  07/2017 Narrative & Impression  Normal pharmacologic myocardial perfusion stress  test without ischemia or scar. The left ventricular ejection fraction is hyperdynamic (>65%). This is a low risk study. Sensitivity and specificity of the study are degraded by body habitus.     Coronary stent intervention 09/2015 Mid LAD lesion, 99% stenosed. Post intervention, there is a 0% residual stenosis. The lesion was previously treated with a stent (unknown type).   R/L heart cath 2017 Mid  LAD lesion, 95% stenosed. The left ventricular systolic function is normal.  Physical Exam:   VS:  BP (!) 150/68 (BP Location: Left Arm, Patient Position: Sitting, Cuff Size: Normal)   Pulse 67   Ht 5\' 5"  (1.651 m)   Wt 247 lb (112 kg)   SpO2 95%   BMI 41.10 kg/m    Wt Readings from Last 3 Encounters:  10/30/23 247 lb (112 kg)  10/25/23 247 lb (112 kg)  04/26/23 229 lb 12.8 oz (104.2 kg)    GEN: Well nourished, well developed in no acute distress NECK: No JVD; No carotid bruits CARDIAC: RRR, + murmurs, rubs, gallops RESPIRATORY:  Clear to auscultation without rales, wheezing or rhonchi  ABDOMEN: Soft, non-tender, non-distended EXTREMITIES:  No edema; No deformity   ASSESSMENT AND PLAN: .    CAD The patient denies anginal symptoms. She is sedentary at baseline. Continue ASA 81mg  daily and Crestor  40mg  daily. BB previously held for hypotension. No further ischemic work-up at this time.   HTN BP is elevated today. She was previously on midodrine , but has not had this in a while so we will remove this from the list. Continue spiro 25mg  daily. If it is still elevated at follow-up would consider starting anti-hypertensive.   HLD LDL 64. Continue Crestor  40mg  daily.   Chronic diastolic heart failure  Echo in 2024 showed normal LVEF 60-65% and G2DD. The patient is euvolemic on exam. Continue spironolactone  25mg  daily and lasix  20mg  daily. She takes an extra lasix  as needed.   Valve disease Echo in 2024 showed normal LVEF, mild MR, mild to mod stenosis, mild to mod TR, mild AI. I will update an echocardiogram.  DM2 Management per PCP.       Dispo: Follow-up in 6 months  Signed, Maija Biggers Rebekah Canada, PA-C

## 2023-10-30 NOTE — Patient Instructions (Signed)
 Medication Instructions:  Your Physician recommend you continue on your current medication as directed.    *If you need a refill on your cardiac medications before your next appointment, please call your pharmacy*  Lab Work: No labs ordered today  If you have labs (blood work) drawn today and your tests are completely normal, you will receive your results only by: MyChart Message (if you have MyChart) OR A paper copy in the mail If you have any lab test that is abnormal or we need to change your treatment, we will call you to review the results.  Testing/Procedures: Cardiac ECHO  Your physician has requested that you have an echocardiogram. Echocardiography is a painless test that uses sound waves to create images of your heart. It provides your doctor with information about the size and shape of your heart and how well your heart's chambers and valves are working.   You may receive an ultrasound enhancing agent through an IV if needed to better visualize your heart during the echo. This procedure takes approximately one hour.  There are no restrictions for this procedure.  This will take place at [address] [facility] [address], [place] [address]  Please note: We ask at that you not bring children with you during ultrasound (echo/ vascular) testing. Due to room size and safety concerns, children are not allowed in the ultrasound rooms during exams. Our front office staff cannot provide observation of children in our lobby area while testing is being conducted. An adult accompanying a patient to their appointment will only be allowed in the ultrasound room at the discretion of the ultrasound technician under special circumstances. We apologize for any inconvenience.   Follow-Up: At [facility], you and your health needs are our priority.  As part of our continuing mission to provide you with exceptional heart care, our providers are all part of one team.  This team  includes your primary Cardiologist (physician) and Advanced Practice Providers or APPs (Physician Assistants and Nurse Practitioners) who all work together to provide you with the care you need, when you need it.  Your next appointment:   6 month(s)  Provider:   Timothy Gollan, MD or Cadence Furth, PA-C    We recommend signing up for the patient portal called "MyChart".  Sign up information is provided on this After Visit Summary.  MyChart is used to connect with patients for Virtual Visits (Telemedicine).  Patients are able to view lab/test results, encounter notes, upcoming appointments, etc.  Non-urgent messages can be sent to your provider as well.   To learn more about what you can do with MyChart, go to [email or web address].

## 2023-12-04 DIAGNOSIS — N2581 Secondary hyperparathyroidism of renal origin: Secondary | ICD-10-CM | POA: Diagnosis not present

## 2023-12-04 DIAGNOSIS — R809 Proteinuria, unspecified: Secondary | ICD-10-CM | POA: Diagnosis not present

## 2023-12-04 DIAGNOSIS — R600 Localized edema: Secondary | ICD-10-CM | POA: Diagnosis not present

## 2023-12-04 DIAGNOSIS — E1122 Type 2 diabetes mellitus with diabetic chronic kidney disease: Secondary | ICD-10-CM | POA: Diagnosis not present

## 2023-12-04 DIAGNOSIS — N184 Chronic kidney disease, stage 4 (severe): Secondary | ICD-10-CM | POA: Diagnosis not present

## 2023-12-12 ENCOUNTER — Ambulatory Visit: Attending: Medical

## 2023-12-12 DIAGNOSIS — I25118 Atherosclerotic heart disease of native coronary artery with other forms of angina pectoris: Secondary | ICD-10-CM | POA: Diagnosis not present

## 2023-12-13 LAB — ECHOCARDIOGRAM COMPLETE
AR max vel: 1.61 cm2
AV Area VTI: 1.73 cm2
AV Area mean vel: 1.66 cm2
AV Mean grad: 7 mmHg
AV Peak grad: 14.1 mmHg
Ao pk vel: 1.88 m/s
Area-P 1/2: 3.72 cm2
S' Lateral: 2.63 cm

## 2023-12-15 ENCOUNTER — Ambulatory Visit: Payer: Self-pay | Admitting: Medical

## 2024-02-06 DIAGNOSIS — E782 Mixed hyperlipidemia: Secondary | ICD-10-CM | POA: Diagnosis not present

## 2024-02-06 DIAGNOSIS — N184 Chronic kidney disease, stage 4 (severe): Secondary | ICD-10-CM | POA: Diagnosis not present

## 2024-02-06 DIAGNOSIS — E538 Deficiency of other specified B group vitamins: Secondary | ICD-10-CM | POA: Diagnosis not present

## 2024-02-06 DIAGNOSIS — E1169 Type 2 diabetes mellitus with other specified complication: Secondary | ICD-10-CM | POA: Diagnosis not present

## 2024-02-06 DIAGNOSIS — D500 Iron deficiency anemia secondary to blood loss (chronic): Secondary | ICD-10-CM | POA: Diagnosis not present

## 2024-02-13 DIAGNOSIS — E1122 Type 2 diabetes mellitus with diabetic chronic kidney disease: Secondary | ICD-10-CM | POA: Diagnosis not present

## 2024-02-13 DIAGNOSIS — K7581 Nonalcoholic steatohepatitis (NASH): Secondary | ICD-10-CM | POA: Diagnosis not present

## 2024-02-13 DIAGNOSIS — N184 Chronic kidney disease, stage 4 (severe): Secondary | ICD-10-CM | POA: Diagnosis not present

## 2024-02-13 DIAGNOSIS — E538 Deficiency of other specified B group vitamins: Secondary | ICD-10-CM | POA: Diagnosis not present

## 2024-02-13 DIAGNOSIS — E1140 Type 2 diabetes mellitus with diabetic neuropathy, unspecified: Secondary | ICD-10-CM | POA: Diagnosis not present

## 2024-02-13 DIAGNOSIS — Z0000 Encounter for general adult medical examination without abnormal findings: Secondary | ICD-10-CM | POA: Diagnosis not present

## 2024-03-07 DIAGNOSIS — N2581 Secondary hyperparathyroidism of renal origin: Secondary | ICD-10-CM | POA: Diagnosis not present

## 2024-03-07 DIAGNOSIS — E1122 Type 2 diabetes mellitus with diabetic chronic kidney disease: Secondary | ICD-10-CM | POA: Diagnosis not present

## 2024-03-07 DIAGNOSIS — R600 Localized edema: Secondary | ICD-10-CM | POA: Diagnosis not present

## 2024-03-07 DIAGNOSIS — N184 Chronic kidney disease, stage 4 (severe): Secondary | ICD-10-CM | POA: Diagnosis not present

## 2024-03-07 DIAGNOSIS — R809 Proteinuria, unspecified: Secondary | ICD-10-CM | POA: Diagnosis not present

## 2024-04-16 DIAGNOSIS — H35371 Puckering of macula, right eye: Secondary | ICD-10-CM | POA: Diagnosis not present

## 2024-04-16 DIAGNOSIS — H40053 Ocular hypertension, bilateral: Secondary | ICD-10-CM | POA: Diagnosis not present

## 2024-04-16 DIAGNOSIS — Z961 Presence of intraocular lens: Secondary | ICD-10-CM | POA: Diagnosis not present

## 2024-04-16 DIAGNOSIS — E113293 Type 2 diabetes mellitus with mild nonproliferative diabetic retinopathy without macular edema, bilateral: Secondary | ICD-10-CM | POA: Diagnosis not present

## 2024-04-22 ENCOUNTER — Inpatient Hospital Stay: Attending: Oncology

## 2024-04-22 DIAGNOSIS — I129 Hypertensive chronic kidney disease with stage 1 through stage 4 chronic kidney disease, or unspecified chronic kidney disease: Secondary | ICD-10-CM | POA: Insufficient documentation

## 2024-04-22 DIAGNOSIS — D500 Iron deficiency anemia secondary to blood loss (chronic): Secondary | ICD-10-CM

## 2024-04-22 DIAGNOSIS — N183 Chronic kidney disease, stage 3 unspecified: Secondary | ICD-10-CM | POA: Insufficient documentation

## 2024-04-22 DIAGNOSIS — D631 Anemia in chronic kidney disease: Secondary | ICD-10-CM | POA: Diagnosis not present

## 2024-04-22 DIAGNOSIS — D509 Iron deficiency anemia, unspecified: Secondary | ICD-10-CM | POA: Diagnosis not present

## 2024-04-22 LAB — CBC WITH DIFFERENTIAL (CANCER CENTER ONLY)
Abs Immature Granulocytes: 0.09 K/uL — ABNORMAL HIGH (ref 0.00–0.07)
Basophils Absolute: 0.1 K/uL (ref 0.0–0.1)
Basophils Relative: 1 %
Eosinophils Absolute: 0.2 K/uL (ref 0.0–0.5)
Eosinophils Relative: 2 %
HCT: 47.8 % — ABNORMAL HIGH (ref 36.0–46.0)
Hemoglobin: 15.3 g/dL — ABNORMAL HIGH (ref 12.0–15.0)
Immature Granulocytes: 1 %
Lymphocytes Relative: 10 %
Lymphs Abs: 0.8 K/uL (ref 0.7–4.0)
MCH: 31.7 pg (ref 26.0–34.0)
MCHC: 32 g/dL (ref 30.0–36.0)
MCV: 99.2 fL (ref 80.0–100.0)
Monocytes Absolute: 0.5 K/uL (ref 0.1–1.0)
Monocytes Relative: 6 %
Neutro Abs: 6.3 K/uL (ref 1.7–7.7)
Neutrophils Relative %: 80 %
Platelet Count: 209 K/uL (ref 150–400)
RBC: 4.82 MIL/uL (ref 3.87–5.11)
RDW: 13.5 % (ref 11.5–15.5)
WBC Count: 7.9 K/uL (ref 4.0–10.5)
nRBC: 0 % (ref 0.0–0.2)

## 2024-04-22 LAB — RETIC PANEL
Immature Retic Fract: 8.7 % (ref 2.3–15.9)
RBC.: 4.79 MIL/uL (ref 3.87–5.11)
Retic Count, Absolute: 59.9 K/uL (ref 19.0–186.0)
Retic Ct Pct: 1.3 % (ref 0.4–3.1)
Reticulocyte Hemoglobin: 36.6 pg (ref >27.9)

## 2024-04-22 LAB — FERRITIN: Ferritin: 82 ng/mL (ref 11–307)

## 2024-04-22 LAB — IRON AND TIBC
Iron: 71 ug/dL (ref 28–170)
Saturation Ratios: 25 % (ref 10.4–31.8)
TIBC: 283 ug/dL (ref 250–450)
UIBC: 212 ug/dL

## 2024-04-25 ENCOUNTER — Ambulatory Visit: Admitting: Oncology

## 2024-04-25 ENCOUNTER — Ambulatory Visit

## 2024-04-25 ENCOUNTER — Inpatient Hospital Stay

## 2024-04-25 ENCOUNTER — Encounter: Payer: Self-pay | Admitting: Oncology

## 2024-04-25 ENCOUNTER — Inpatient Hospital Stay: Admitting: Oncology

## 2024-04-25 VITALS — BP 127/65 | HR 91 | Temp 98.2°F | Resp 18 | Wt 241.9 lb

## 2024-04-25 DIAGNOSIS — I129 Hypertensive chronic kidney disease with stage 1 through stage 4 chronic kidney disease, or unspecified chronic kidney disease: Secondary | ICD-10-CM | POA: Diagnosis not present

## 2024-04-25 DIAGNOSIS — N184 Chronic kidney disease, stage 4 (severe): Secondary | ICD-10-CM | POA: Diagnosis not present

## 2024-04-25 DIAGNOSIS — N183 Chronic kidney disease, stage 3 unspecified: Secondary | ICD-10-CM | POA: Diagnosis not present

## 2024-04-25 DIAGNOSIS — D631 Anemia in chronic kidney disease: Secondary | ICD-10-CM

## 2024-04-25 DIAGNOSIS — D500 Iron deficiency anemia secondary to blood loss (chronic): Secondary | ICD-10-CM

## 2024-04-25 DIAGNOSIS — D509 Iron deficiency anemia, unspecified: Secondary | ICD-10-CM | POA: Diagnosis not present

## 2024-04-25 NOTE — Assessment & Plan Note (Addendum)
 Continue close monitor.   No need for EPO replacement. Continue oral supplementation

## 2024-04-25 NOTE — Assessment & Plan Note (Addendum)
 Suspect chronic occult blood loss from GI tract.  Patient has extensive GI workup. Labs are reviewed and discussed with patient. Lab Results  Component Value Date   HGB 15.3 (H) 04/22/2024   TIBC 283 04/22/2024   IRONPCTSAT 25 04/22/2024   FERRITIN 82 04/22/2024      Hold off IV Venofer  treatments today. Continue oral iron  supplementation, decrease frequency to every other day

## 2024-04-25 NOTE — Progress Notes (Signed)
 Hematology/Oncology Progress note [phone] [phone]      CHIEF COMPLAINTS/REASON FOR VISIT:  Iron  deficiency anemia, anemia due to CKD  ASSESSMENT & PLAN:   IDA (iron  deficiency anemia) Suspect chronic occult blood loss from GI tract.  Patient has extensive GI workup. Labs are reviewed and discussed with patient. Lab Results  Component Value Date   HGB 15.3 (H) 04/22/2024   TIBC 283 04/22/2024   IRONPCTSAT 25 04/22/2024   FERRITIN 82 04/22/2024      Hold off IV Venofer  treatments today. Continue oral iron  supplementation, decrease frequency to every other day    Anemia in chronic kidney disease (CKD) Continue close monitor.   No need for EPO replacement. Continue oral supplementation    Orders Placed This Encounter  Procedures   CBC with Differential (Cancer Center Only)    Standing Status:   Future    Expected Date:   10/24/2024    Expiration Date:   01/22/2025   Iron  and TIBC    Standing Status:   Future    Expected Date:   10/24/2024    Expiration Date:   01/22/2025   Ferritin    Standing Status:   Future    Expected Date:   10/24/2024    Expiration Date:   01/22/2025   Retic Panel    Standing Status:   Future    Expected Date:   10/24/2024    Expiration Date:   01/22/2025   Follow-up in 6 months. All questions were answered. The patient knows to call the clinic with any problems, questions or concerns.  Zelphia Cap, MD, PhD [facility] Hematology Oncology 04/25/2024     HISTORY OF PRESENTING ILLNESS:  Melissa Christian is a  83 y.o.  female with PMH listed below who was referred to me for anemia Reviewed patient's recent labs that was done.  She was found to have abnormal CBC on 02/22/2022, with a hemoglobin of 8.4, MCV 83.5.  B12 adequate, patient was advised to take ferrous sulfate 325 mg twice daily.  She had a repeat hemoglobin 03/31/2022 which was 7.9, ferritin 9.  Reviewed patient's previous labs ordered by primary care physician's  office, anemia is chronic onset , since early 2023. Patient reports feeling tired.  No energy. She denies recent chest pain on exertion, pre-syncopal episodes, or palpitations She had not noticed any recent bleeding such as epistaxis, hematuria or hematochezia.  She denies over the counter NSAID ingestion. She is on antiplatelets agent-aspirin  81 mg. Continue had a colonoscopy. She was accompanied by her sister.  INTERVAL HISTORY Melissa Christian is a 83 y.o. female who has above history reviewed by me today presents for follow up visit for iron  deficiency anemia.  S/p IV venofer .  Patient reports fatigue level has improved. During the interval, patient establish with gastroenterology and had GI workup. 06/26/2022 colonoscopy showed entire examined colon is normal.  Examined portion of ileum was normal. Upper endoscopy showed normal esophagus.  A few gastric polyps.  A single duodenal polyp biopsied.-Pathology showed duodenum tubular adenoma 07/06/2022 capsule study showed duodenal polyp, otherwise normal study.  INTERVAL HISTORY Melissa Christian is a 83 y.o. female who has above history reviewed by me today presents for follow up visit for anemia.  Patient reports feeling well. No new complaints Patient takes oral iron  supplementation daily.  MEDICAL HISTORY:  Past Medical History:  Diagnosis Date   Anemia    CAD (coronary artery disease)    a. NSTEMI 09/2015: cardiac  cath: LM no obs dz, mLAD 95% s/p PCI/DES 0%, LCx no obs, RCA no obs, LVEF 55-65%   Chronic kidney disease (CKD), stage III (moderate) (HCC)    Diabetes mellitus with complication (HCC)    HLD (hyperlipidemia)    Hypertension    Low blood potassium    NSTEMI (non-ST elevated myocardial infarction) (HCC)    PONV (postoperative nausea and vomiting)    Valvular heart disease    a. 09/2015: EF 55-60%, challenging images unable to exclude mild mid-distal anterior to apical HK, nl LV dias fxn, mild AI/MR, normal size of left  atrium, RV systolic function normal, PASP normal      SURGICAL HISTORY: Past Surgical History:  Procedure Laterality Date   BACK SURGERY     CARDIAC CATHETERIZATION N/A 09/23/2015   Procedure: Right and Left Heart Cath;  Surgeon: Evalene JINNY Lunger, MD;  Location: [facility] INVASIVE CV LAB;  Service: Cardiovascular;  Laterality: N/A;   CARDIAC CATHETERIZATION N/A 09/23/2015   Procedure: Coronary Stent Intervention;  Surgeon: Cara JONETTA Lovelace, MD;  Location: [facility] INVASIVE CV LAB;  Service: Cardiovascular;  Laterality: N/A;   CATARACT EXTRACTION W/PHACO Left 04/26/2017   Procedure: CATARACT EXTRACTION PHACO AND INTRAOCULAR LENS PLACEMENT (IOC) LEFT DIABETIC;  Surgeon: Mittie Gaskin, MD;  Location: [facility];  Service: Ophthalmology;  Laterality: Left;  diabetic-oral meds   CATARACT EXTRACTION W/PHACO Right 05/31/2017   Procedure: CATARACT EXTRACTION PHACO AND INTRAOCULAR LENS PLACEMENT (IOC) RIGHT DIABETIC;  Surgeon: Mittie Gaskin, MD;  Location: [facility];  Service: Ophthalmology;  Laterality: Right;   CHOLECYSTECTOMY N/A 06/16/2016   Procedure: LAPAROSCOPIC CHOLECYSTECTOMY WITH INTRAOPERATIVE CHOLANGIOGRAM;  Surgeon: Larinda Unknown Sharps, MD;  Location: [facility] ORS;  Service: General;  Laterality: N/A;   COLONOSCOPY WITH PROPOFOL  N/A 06/26/2022   Procedure: COLONOSCOPY WITH PROPOFOL ;  Surgeon: Therisa Bi, MD;  Location: [facility] ENDOSCOPY;  Service: Gastroenterology;  Laterality: N/A;   CORONARY STENT PLACEMENT     ESOPHAGOGASTRODUODENOSCOPY N/A 06/30/2022   Procedure: ESOPHAGOGASTRODUODENOSCOPY (EGD);  Surgeon: Unk Corinn Skiff, MD;  Location: [facility] ENDOSCOPY;  Service: Gastroenterology;  Laterality: N/A;   ESOPHAGOGASTRODUODENOSCOPY (EGD) WITH PROPOFOL  N/A 06/26/2022   Procedure: ESOPHAGOGASTRODUODENOSCOPY (EGD) WITH PROPOFOL ;  Surgeon: Therisa Bi, MD;  Location: [facility] ENDOSCOPY;  Service: Gastroenterology;  Laterality: N/A;   GIVENS CAPSULE STUDY N/A 06/30/2022    Procedure: GIVENS CAPSULE STUDY;  Surgeon: Unk Corinn Skiff, MD;  Location: [facility] ENDOSCOPY;  Service: Gastroenterology;  Laterality: N/A;  PLACE CAPSULE DURING EGD   JOINT REPLACEMENT     LUMBAR FUSION     TOTAL HIP ARTHROPLASTY      SOCIAL HISTORY: Social History   Socioeconomic History   Marital status: Widowed    Spouse name: Not on file   Number of children: Not on file   Years of education: Not on file   Highest education level: Not on file  Occupational History   Not on file  Tobacco Use   Smoking status: Never   Smokeless tobacco: Never  Vaping Use   Vaping status: Never Used  Substance and Sexual Activity   Alcohol use: No    Alcohol/week: 0.0 standard drinks of alcohol   Drug use: No   Sexual activity: Not Currently    Birth control/protection: None, Post-menopausal  Other Topics Concern   Not on file  Social History Narrative   Not on file   Social Drivers of Health   Financial Resource Strain: Low Risk  (02/13/2024)   Received from [facility]   Overall  Financial Resource Strain (CARDIA)    Difficulty of Paying Living Expenses: Not hard at all  Food Insecurity: No Food Insecurity (02/13/2024)   Received from [facility]   Hunger Vital Sign    Within the past 12 months, you worried that your food would run out before you got the money to buy more.: Never true    Within the past 12 months, the food you bought just didn't last and you didn't have money to get more.: Never true  Transportation Needs: No Transportation Needs (02/13/2024)   Received from [facility] - Transportation    In the past 12 months, has lack of transportation kept you from medical appointments or from getting medications?: No    Lack of Transportation (Non-Medical): No  Physical Activity: Inactive (09/28/2022)   Received from [facility]   Exercise Vital Sign    On average, how many days per week do you  engage in moderate to strenuous exercise (like a brisk walk)?: 0 days    On average, how many minutes do you engage in exercise at this level?: 0 min  Stress: Not on file  Social Connections: Moderately Isolated (09/28/2022)   Received from [facility]   Social Connection and Isolation Panel    In a typical week, how many times do you talk on the phone with family, friends, or neighbors?: More than three times a week    How often do you get together with friends or relatives?: More than three times a week    How often do you attend church or religious services?: More than 4 times per year    Do you belong to any clubs or organizations such as church groups, unions, fraternal or athletic groups, or school groups?: No    How often do you attend meetings of the clubs or organizations you belong to?: Never    Are you married, widowed, divorced, separated, never married, or living with a partner?: Widowed  Intimate Partner Violence: Not At Risk (11/12/2022)   Humiliation, Afraid, Rape, and Kick questionnaire    Fear of Current or Ex-Partner: No    Emotionally Abused: No    Physically Abused: No    Sexually Abused: No    FAMILY HISTORY: Family History  Problem Relation Age of Onset   Diabetes Mother    Hypertension Mother    Heart disease Father    Cancer Maternal Grandfather     ALLERGIES:  is allergic to cephalexin, hydrocodone -acetaminophen , oxycodone-acetaminophen , percocet [oxycodone-acetaminophen ], vicodin [hydrocodone -acetaminophen ], ace inhibitors, ferrous sulfate, and penicillins.  MEDICATIONS:  Current Outpatient Medications  Medication Sig Dispense Refill   aspirin  EC 81 MG tablet Take 81 mg by mouth daily.     Cholecalciferol  (VITAMIN D3) 2000 units TABS Take 2,000 Units by mouth daily.     ferrous gluconate  (FERGON) 324 MG tablet Take 1 tablet (324 mg total) by mouth daily with breakfast. 60 tablet 3   furosemide  (LASIX ) 40 MG tablet Take 1 tablet (40 mg  total) by mouth daily. May take an extra 1 tablet ( 40 mg) in the afternoon as needed for left swelling. No more then 2 extra tablets a week. 360 tablet 1   gabapentin (NEURONTIN) 100 MG capsule Take 100 mg by mouth daily.     pantoprazole  (PROTONIX ) 40 MG tablet Take 1 tablet (40 mg total) by mouth daily. 30 tablet 1   rosuvastatin  (CRESTOR ) 40 MG tablet Take 1 tablet (40  mg total) by mouth daily. 90 tablet 3   spironolactone  (ALDACTONE ) 25 MG tablet Take 1 tablet (25 mg total) by mouth daily. 30 tablet 5   TRULICITY 1.5 MG/0.5ML SOPN 3.5 mg.     No current facility-administered medications for this visit.    Review of Systems  Constitutional:  Positive for fatigue. Negative for appetite change, chills and fever.  HENT:   Negative for hearing loss and voice change.   Eyes:  Negative for eye problems.  Respiratory:  Negative for chest tightness and cough.   Cardiovascular:  Negative for chest pain.  Gastrointestinal:  Negative for abdominal distention, abdominal pain and blood in stool.  Endocrine: Negative for hot flashes.  Genitourinary:  Negative for difficulty urinating and frequency.   Musculoskeletal:  Negative for arthralgias.  Skin:  Negative for itching and rash.  Neurological:  Negative for extremity weakness.  Hematological:  Negative for adenopathy.  Psychiatric/Behavioral:  Negative for confusion.     PHYSICAL EXAMINATION: ECOG PERFORMANCE STATUS: 2 - Symptomatic, <50% confined to bed Vitals:   04/25/24 1449  BP: 127/65  Pulse: 91  Resp: 18  Temp: 98.2 F (36.8 C)   Filed Weights   04/25/24 1449  Weight: 241 lb 14.4 oz (109.7 kg)     Physical Exam Constitutional:      General: She is not in acute distress.    Appearance: She is obese.     Comments: Patient sits in the wheelchair  HENT:     Head: Normocephalic and atraumatic.  Eyes:     General: No scleral icterus. Cardiovascular:     Rate and Rhythm: Normal rate and regular rhythm.     Heart sounds:  Murmur heard.  Pulmonary:     Effort: Pulmonary effort is normal. No respiratory distress.  Abdominal:     General: There is no distension.  Musculoskeletal:        General: Normal range of motion.     Cervical back: Normal range of motion and neck supple.  Skin:    Findings: No erythema.  Neurological:     Mental Status: She is alert and oriented to person, place, and time. Mental status is at baseline.  Psychiatric:        Mood and Affect: Mood normal.      LABORATORY DATA:  I have reviewed the data as listed    Latest Ref Rng & Units 04/22/2024    9:14 AM 10/23/2023    9:23 AM 04/24/2023    9:19 AM  CBC  WBC 4.0 - 10.5 K/uL 7.9  6.7  6.4   Hemoglobin 12.0 - 15.0 g/dL 84.6  85.6  85.6   Hematocrit 36.0 - 46.0 % 47.8  45.3  44.0   Platelets 150 - 400 K/uL 209  220  238       Latest Ref Rng & Units 11/15/2022    4:11 AM 11/14/2022    4:25 AM 11/13/2022    5:30 AM  CMP  Glucose 70 - 99 mg/dL 897  92  880   BUN 8 - 23 mg/dL 48  49  55   Creatinine 0.44 - 1.00 mg/dL 7.69  7.74  7.48   Sodium 135 - 145 mmol/L 139  137  137   Potassium 3.5 - 5.1 mmol/L 3.7  3.7  3.4   Chloride 98 - 111 mmol/L 99  99  102   CO2 22 - 32 mmol/L 33  29  28   Calcium  8.9 - 10.3  mg/dL 8.3  8.3  8.3       Component Value Date/Time   IRON  71 04/22/2024 0914   TIBC 283 04/22/2024 0914   FERRITIN 82 04/22/2024 0914   IRONPCTSAT 25 04/22/2024 0914     RADIOGRAPHIC STUDIES: I have personally reviewed the radiological images as listed and agreed with the findings in the report. No results found.

## 2024-05-06 ENCOUNTER — Ambulatory Visit

## 2024-05-06 DIAGNOSIS — L821 Other seborrheic keratosis: Secondary | ICD-10-CM | POA: Diagnosis not present

## 2024-05-06 DIAGNOSIS — L578 Other skin changes due to chronic exposure to nonionizing radiation: Secondary | ICD-10-CM | POA: Diagnosis not present

## 2024-05-06 DIAGNOSIS — L570 Actinic keratosis: Secondary | ICD-10-CM | POA: Diagnosis not present

## 2024-05-06 DIAGNOSIS — W908XXA Exposure to other nonionizing radiation, initial encounter: Secondary | ICD-10-CM

## 2024-05-06 DIAGNOSIS — L820 Inflamed seborrheic keratosis: Secondary | ICD-10-CM

## 2024-05-06 DIAGNOSIS — L814 Other melanin hyperpigmentation: Secondary | ICD-10-CM | POA: Diagnosis not present

## 2024-05-06 NOTE — Patient Instructions (Addendum)
 Cryosurgery  Cryosurgery ("freezing") uses liquid nitrogen to destroy certain types of skin lesions. Lowering the temperature of the lesion in a small area surrounding skin destroys the lesion. Immediately following cryosurgery, you will notice redness and swelling of the treatment area. Blistering or weeping may occur, lasting approximately one week which will then be followed by crusting. Most areas will heal completely in 10 to 14 days.  Wash the treated areas daily. Allow soap and water to run over the areas, but do not scrub. Should a scab or crust form, allow it to fall off on its own. Do not remove or pick at it. Application of an ointment  and a bandage may make you feel more comfortable, but it is not necessary. Some people develop an allergy to Neosporin, so we recommend that Vaseline or  Aquaphor be used.  The cryotherapy site will be more sensitive than your surrounding skin. Keep it covered, and remember to apply sunscreen every day to all your sun exposed skin. A scar may remain which is lighter or pinker than your normal skin. Your body will continue to improve your scar for up to one year; however a light-colored scar may remain.  Infection following cryotherapy is rare. However if you are worried about the appearance of the treated area, contact your doctor. We have a physician on call at all times. If you have any concerns about the site, please call our clinic at [phone]    Due to recent changes in healthcare laws, you may see results of your pathology and/or laboratory studies on MyChart before the doctors have had a chance to review them. We understand that in some cases there may be results that are confusing or concerning to you. Please understand that not all results are received at the same time and often the doctors may need to interpret multiple results in order to provide you with the best plan of care or course of treatment. Therefore, we ask that you please give us  2  business days to thoroughly review all your results before contacting the office for clarification. Should we see a critical lab result, you will be contacted sooner.   If You Need Anything After Your Visit  If you have any questions or concerns for your doctor, please call our main line at [phone] and press option 4 to reach your doctor's medical assistant. If no one answers, please leave a voicemail as directed and we will return your call as soon as possible. Messages left after 4 pm will be answered the following business day.   You may also send us  a message via MyChart. We typically respond to MyChart messages within 1-2 business days.  For prescription refills, please ask your pharmacy to contact our office. Our fax number is [phone].  If you have an urgent issue when the clinic is closed that cannot wait until the next business day, you can page your doctor at the number below.    Please note that while we do our best to be available for urgent issues outside of office hours, we are not available 24/7.   If you have an urgent issue and are unable to reach us , you may choose to seek medical care at your doctor's office, retail clinic, urgent care center, or emergency room.  If you have a medical emergency, please immediately call 911 or go to the emergency department.  Pager Numbers  - Dr. Hester: [phone]  - Dr. Jackquline: [phone]  - Dr. Claudene: [phone]  In the event of inclement weather, please call our main line at [phone] for an update on the status of any delays or closures.  Dermatology Medication Tips: Please keep the boxes that topical medications come in in order to help keep track of the instructions about where and how to use these. Pharmacies typically print the medication instructions only on the boxes and not directly on the medication tubes.   If your medication is too expensive, please contact our office at [phone] option 4 or  send us  a message through MyChart.   We are unable to tell what your co-pay for medications will be in advance as this is different depending on your insurance coverage. However, we may be able to find a substitute medication at lower cost or fill out paperwork to get insurance to cover a needed medication.   If a prior authorization is required to get your medication covered by your insurance company, please allow us  1-2 business days to complete this process.  Drug prices often vary depending on where the prescription is filled and some pharmacies may offer cheaper prices.  The website www.goodrx.com contains coupons for medications through different pharmacies. The prices here do not account for what the cost may be with help from insurance (it may be cheaper with your insurance), but the website can give you the price if you did not use any insurance.  - You can print the associated coupon and take it with your prescription to the pharmacy.  - You may also stop by our office during regular business hours and pick up a GoodRx coupon card.  - If you need your prescription sent electronically to a different pharmacy, notify our office through [facility] or by phone at [phone] option 4.     Si Usted Necesita Algo Despus de Su Visita  Tambin puede enviarnos un mensaje a travs de Clinical cytogeneticist. Por lo general respondemos a los mensajes de MyChart en el transcurso de 1 a 2 das hbiles.  Para renovar recetas, por favor pida a su farmacia que se ponga en contacto con nuestra oficina. Randi lakes de fax es [place] [phone].  Si tiene un asunto urgente cuando la clnica est cerrada y que no puede esperar hasta el siguiente da hbil, puede llamar/localizar a su doctor(a) al nmero que aparece a continuacin.   Por favor, tenga en cuenta que aunque hacemos todo lo posible para estar disponibles para asuntos urgentes fuera del horario de [place], no estamos disponibles las 24 horas del  da, los 7 [address]  [address] de la [place].   Si tiene un problema urgente y no puede comunicarse con nosotros, puede optar por buscar atencin mdica  en el consultorio de su doctor(a), en una clnica privada, en un centro de atencin urgente o en una sala de emergencias.  Si tiene Engineer, drilling, por favor llame inmediatamente al 911 o vaya a la sala de emergencias.  Nmeros de bper  - Dr. Hester: [phone]  - Dra. Jackquline: [phone]  - Dr. Claudene: [phone]   En caso de inclemencias del tiempo, por favor llame a landry capes principal al [phone] para una actualizacin sobre el [place] de cualquier retraso o cierre.  Consejos para la medicacin en dermatologa: Por favor, guarde las cajas en las que vienen los medicamentos de uso tpico para ayudarle a seguir las instrucciones sobre dnde y cmo usarlos. Las farmacias generalmente imprimen las instrucciones del medicamento slo en las cajas y no directamente en los tubos del [place].  Si su medicamento es muy caro, por favor, pngase en contacto con landry rieger llamando al [phone] y presione la opcin 4 o envenos un mensaje a travs de Clinical cytogeneticist.   No podemos decirle cul ser su copago por los medicamentos por adelantado ya que esto es diferente dependiendo de la cobertura de su seguro. Sin embargo, es posible que podamos encontrar un medicamento sustituto a Audiological scientist un formulario para que el seguro cubra el medicamento que se considera necesario.   Si se requiere una autorizacin previa para que su compaa de seguros malta su medicamento, por favor permtanos de 1 a 2 das hbiles para completar este proceso.  Los precios de los medicamentos varan con frecuencia dependiendo del Environmental consultant de dnde se surte la receta y alguna farmacias pueden ofrecer precios ms baratos.  El sitio web www.goodrx.com tiene cupones para medicamentos de Health and safety inspector. Los precios aqu no tienen en cuenta lo que podra  costar con la ayuda del seguro (puede ser ms barato con su seguro), pero el sitio web puede darle el precio si no utiliz Tourist information centre manager.  - Puede imprimir el cupn correspondiente y llevarlo con su receta a la farmacia.  - Tambin puede pasar por nuestra oficina durante el horario de atencin regular y Education officer, museum una tarjeta de cupones de GoodRx.  - Si necesita que su receta se enve electrnicamente a una farmacia diferente, informe a nuestra oficina a travs de MyChart de [place] o por telfono llamando al [phone] y presione la opcin 4.

## 2024-05-06 NOTE — Progress Notes (Signed)
 Subjective   Melissa Christian is a 83 y.o. female who presents for the following: Lesion(s) of concern . Patient is new patient.  Today patient reports: LOC at right temple itching, not painful. No personal hx of skin cancer.  This patient is accompanied in the office by her sibling.  Review of Systems:    No other skin or systemic complaints except as noted in HPI or Assessment and Plan.  The following portions of the chart were reviewed this encounter and updated as appropriate: medications, allergies, medical history  Relevant Medical History:  n/a   Objective  Well appearing patient in no apparent distress; mood and affect are within normal limits. Examination was performed of the: Focused Exam of: Face   Examination notable for: Lentigo/lentigines: Scattered pigmented macules that are tan to brown in color and are somewhat non-uniform in shape and concentrated in the sun-exposed areas, Seborrheic Keratosis(es): Stuck-on appearing keratotic papule(s) on the trunk, some  irritated with redness, crusting, edema, and/or partial avulsion, Actinic Damage/Elastosis: chronic sun damage: dyspigmentation, telangiectasia, and wrinkling, Actinic keratosis: Scaly erythematous macule(s) concentrated on sun exposed areas   Examination limited by: Undergarments, Shoes or socks , Clothing, and Patient deferred removal     Face x3 (3) Stuck on waxy paps with erythema Left cheek x1 Pink scaly macules  Assessment & Plan   Benign Lesions/ Findings:  -Seborrheic Keratosis  - Lentigo's - Reassurance provided regarding the benign appearance of lesions noted on exam today; no treatment is indicated in the absence of symptoms/changes. - Reinforced importance of photoprotective strategies including liberal and frequent sunscreen use of a broad-spectrum SPF 30 or greater, use of protective clothing, and sun avoidance for prevention of cutaneous malignancy and photoaging.  Counseled patient on the  importance of regular self-skin monitoring as well as routine clinical skin examinations as scheduled.   ACTINIC DAMAGE - chronic, secondary to cumulative UV radiation exposure/sun exposure over time - diffuse scaly erythematous macules with underlying dyspigmentation - Recommend daily broad spectrum sunscreen SPF 30+ to sun-exposed areas, reapply every 2 hours as needed.  - Recommend staying in the shade or wearing long sleeves, sun glasses (UVA+UVB protection) and wide brim hats (4-inch brim around the entire circumference of the hat). - Call for new or changing lesions.   Level of service outlined above   Procedures, orders, diagnosis for this visit:  INFLAMED SEBORRHEIC KERATOSIS (3) Face x3 (3) Symptomatic, irritating, patient would like treated. Destruction of lesion - Face x3 (3) Complexity: simple   Destruction method: cryotherapy   Informed consent: discussed and consent obtained   Timeout:  patient name, date of birth, surgical site, and procedure verified Lesion destroyed using liquid nitrogen: Yes   Region frozen until ice ball extended beyond lesion: Yes   Cryo cycles: 1 or 2. Outcome: patient tolerated procedure well with no complications   Post-procedure details: wound care instructions given   Additional details:  Prior to procedure, discussed risks of blister formation, small wound, skin dyspigmentation, or rare scar following cryotherapy. Recommend Vaseline ointment to treated areas while healing.   AK (ACTINIC KERATOSIS) Left cheek x1 Actinic keratoses are precancerous spots that appear secondary to cumulative UV radiation exposure/sun exposure over time. They are chronic with expected duration over 1 year. A portion of actinic keratoses will progress to squamous cell carcinoma of the skin. It is not possible to reliably predict which spots will progress to skin cancer and so treatment is recommended to prevent development of skin cancer.  Recommend daily broad  spectrum sunscreen SPF 30+ to sun-exposed areas, reapply every 2 hours as needed.  Recommend staying in the shade or wearing long sleeves, sun glasses (UVA+UVB protection) and wide brim hats (4-inch brim around the entire circumference of the hat). Call for new or changing lesions. Destruction of lesion - Left cheek x1 Complexity: simple   Destruction method: cryotherapy   Informed consent: discussed and consent obtained   Timeout:  patient name, date of birth, surgical site, and procedure verified Lesion destroyed using liquid nitrogen: Yes   Region frozen until ice ball extended beyond lesion: Yes   Cryo cycles: 1 or 2. Outcome: patient tolerated procedure well with no complications   Post-procedure details: wound care instructions given   Additional details:  Prior to procedure, discussed risks of blister formation, small wound, skin dyspigmentation, or rare scar following cryotherapy. Recommend Vaseline ointment to treated areas while healing.    Inflamed seborrheic keratosis -     Destruction of lesion  AK (actinic keratosis) -     Destruction of lesion    Return to clinic: Return for PRN.  I, Jacquelynn V. Wilfred, CMA, am acting as scribe for Lauraine JAYSON Kanaris, MD.  Documentation: I have reviewed the above documentation for accuracy and completeness, and I agree with the above.  Lauraine JAYSON Kanaris, MD

## 2024-06-07 ENCOUNTER — Ambulatory Visit: Admitting: Medical

## 2024-07-01 ENCOUNTER — Ambulatory Visit: Admitting: Medical

## 2024-07-01 ENCOUNTER — Encounter: Payer: Self-pay | Admitting: Medical

## 2024-07-01 VITALS — BP 110/60 | HR 81 | Ht 64.0 in | Wt 245.4 lb

## 2024-07-01 DIAGNOSIS — I5032 Chronic diastolic (congestive) heart failure: Secondary | ICD-10-CM | POA: Diagnosis not present

## 2024-07-01 DIAGNOSIS — E1122 Type 2 diabetes mellitus with diabetic chronic kidney disease: Secondary | ICD-10-CM

## 2024-07-01 DIAGNOSIS — N184 Chronic kidney disease, stage 4 (severe): Secondary | ICD-10-CM

## 2024-07-01 DIAGNOSIS — I10 Essential (primary) hypertension: Secondary | ICD-10-CM | POA: Diagnosis not present

## 2024-07-01 DIAGNOSIS — I25118 Atherosclerotic heart disease of native coronary artery with other forms of angina pectoris: Secondary | ICD-10-CM | POA: Diagnosis not present

## 2024-07-01 DIAGNOSIS — E782 Mixed hyperlipidemia: Secondary | ICD-10-CM | POA: Diagnosis not present

## 2024-07-01 NOTE — Progress Notes (Signed)
 " Cardiology Office Note   Date:  07/01/2024  ID:  [name], [name] 11-05-1940, MRN [id number] PCP: Cleotilde Oneil FALCON, MD  [facility] Providers Cardiologist:  Evalene Lunger, MD   History of Present Illness Melissa Christian is a 83 y.o. female  with a h/o CAD with NSTEMI 09/2015 with PCI/DES mLAD, CKD Stage 4, DM2, HTN, HLD, AI, MR who presents for 6 month follow-up.    Echocardiogram in 2017 showed EF of 55 to 60%.  Image quality was challenging, thus unable to exclude mild mid to distal anterior wall and apical hypokinesis.  It showed mild AI and MR.  Repeat echo in 2018 showed EF of 60 to 65%, no wall motion abnormality, grade 1 diastolic dysfunction, mild AI, mild MR, normal PASP.  Lexi scan Myoview  in 2019 for atypical chest discomfort was low risk.  Echo in 2020 showed EF 60 to 65%, grade 1 diastolic dysfunction, moderate TR, moderate MR.  Echo in February 2024 showed EF of 60 to 65%, mild LVH, grade 2 diastolic dysfunction, mild MR, mild to moderate mitral stenosis, mild to moderate TR, mild AI.  The patient was last seen 10/30/23 and was overall doing well. She was no longer on midodrine .   Today, the patient is overall doing well. She denies chest pain, SOB. She has occasional lower leg edema. She takes lasix  40mg  daily. She denies lightheadedness, dizziness, palpitations. She uses a cane and walker.   Studies Reviewed EKG Interpretation Date/Time:  Monday July 01 2024 13:50:06 EST Ventricular Rate:  81 PR Interval:  174 QRS Duration:  130 QT Interval:  398 QTC Calculation: 462 R Axis:   -71  Text Interpretation: Normal sinus rhythm Right bundle branch block Left anterior fascicular block Bifascicular block Possible Lateral infarct , age undetermined When compared with ECG of 30-Oct-2023 09:53, No significant change was found Confirmed by Franchester, Tywan Siever ([id number]) on 07/01/2024 1:58:33 PM    Echo 12/2023 1. Left ventricular ejection fraction, by estimation, is 60 to 65%.  The  left ventricle has normal function. The left ventricle has no regional  wall motion abnormalities. There is mild left ventricular hypertrophy.  Left ventricular diastolic parameters  are consistent with Grade I diastolic dysfunction (impaired relaxation).   2. Right ventricular systolic function is normal. The right ventricular  size is normal.   3. The mitral valve is normal in structure. Mild mitral valve  regurgitation.   4. The aortic valve is calcified. Aortic valve regurgitation is mild.  Aortic valve sclerosis/calcification is present, without any evidence of  aortic stenosis. Aortic valve mean gradient measures 7.0 mmHg.   5. The inferior vena cava is normal in size with greater than 50%  respiratory variability, suggesting right atrial pressure of 3 mmHg.    Echo 08/2022  1. Left ventricular ejection fraction, by estimation, is 60 to 65%. The  left ventricle has normal function. The left ventricle has no regional  wall motion abnormalities. There is mild left ventricular hypertrophy.  Left ventricular diastolic parameters  are consistent with Grade II diastolic dysfunction (pseudonormalization).   2. Right ventricular systolic function is normal. The right ventricular  size is normal.   3. HR 75 bpm. The mitral valve is normal in structure. Mild mitral valve  regurgitation. Mild to moderate mitral stenosis. The mean mitral valve  gradient is 6.0 mmHg.   4. Tricuspid valve regurgitation is mild to moderate.   5. The aortic valve is tricuspid. Aortic valve regurgitation is mild.  Aortic valve sclerosis/calcification is present, without any evidence of  aortic stenosis.    Echo 09/2018 1. The left ventricle has normal systolic function with an ejection  fraction of 60-65%. The cavity size was normal. Left ventricular diastolic  Doppler parameters are consistent with pseudonormalization.   2. The right ventricle has normal systolic function. The cavity was  normal. There  is no increase in right ventricular wall thickness. Right  ventricular systolic pressure is moderately elevated with an estimated  pressure of 54.6 mmHg.   3. Tricuspid valve regurgitation is moderate.    Myoview  lexiscan  07/2017 Narrative & Impression  Normal pharmacologic myocardial perfusion stress test without ischemia or scar. The left ventricular ejection fraction is hyperdynamic (>65%). This is a low risk study. Sensitivity and specificity of the study are degraded by body habitus.      Coronary stent intervention 09/2015 Mid LAD lesion, 99% stenosed. Post intervention, there is a 0% residual stenosis. The lesion was previously treated with a stent (unknown type).   R/L heart cath 2017 Mid LAD lesion, 95% stenosed. The left ventricular systolic function is normal.     Physical Exam VS:  BP 110/60 (BP Location: Right Arm, Patient Position: Sitting, Cuff Size: Large)   Pulse 81   Ht 5' 4 (1.626 m)   Wt 245 lb 6 oz (111.3 kg)   SpO2 96%   BMI 42.12 kg/m        Wt Readings from Last 3 Encounters:  07/01/24 245 lb 6 oz (111.3 kg)  04/25/24 241 lb 14.4 oz (109.7 kg)  10/30/23 247 lb (112 kg)    GEN: Well nourished, well developed in no acute distress NECK: No JVD; No carotid bruits CARDIAC: RRR, no murmurs, rubs, gallops RESPIRATORY:  Clear to auscultation without rales, wheezing or rhonchi  ABDOMEN: Soft, non-tender, non-distended EXTREMITIES:  No edema; No deformity   ASSESSMENT AND PLAN  CAD The patient denies anginal symptoms. She is mostly sedentary. Continue ASA 81mg  daily and Crestor  40mg  daily. BB previously held for hypotension.   HTN BP 110/60. Continue spironolactone  25mg  daily, lasix  40mg  daily.   HLD LDL 59. Continue Crestor  40mg  daily.   Chronic diastolic heart failure The patient is euvolemic on exam. She follows with nephrology who increased diuretic per patient report. Continue lasix  40mg  daily and spironolactone  25mg  daily.   Valve  disease Echo 12/2023 showed normal pump function, mild MR, mild AI. Can repeat at follow-up.  CKD stage 4 Scr baseline around 2.2. she is followed by nephrology.        Dispo: follow-up in 6 months  Signed, Tyaire Odem VEAR Fishman, PA-C   "

## 2024-07-01 NOTE — Patient Instructions (Signed)
 Medication Instructions:  Your physician recommends that you continue on your current medications as directed. Please refer to the Current Medication list given to you today.    *If you need a refill on your cardiac medications before your next appointment, please call your pharmacy*  Lab Work: No labs ordered today    Testing/Procedures: No test ordered today   Follow-Up: At [facility], you and your health needs are our priority.  As part of our continuing mission to provide you with exceptional heart care, our providers are all part of one team.  This team includes your primary Cardiologist (physician) and Advanced Practice Providers or APPs (Physician Assistants and Nurse Practitioners) who all work together to provide you with the care you need, when you need it.  Your next appointment:   6 month(s)  Provider:   Timothy Gollan, MD or Cadence Franchester, PA-C

## 2024-10-24 ENCOUNTER — Inpatient Hospital Stay

## 2024-10-28 ENCOUNTER — Inpatient Hospital Stay: Admitting: Oncology

## 2024-10-28 ENCOUNTER — Inpatient Hospital Stay

## 2024-12-20 ENCOUNTER — Ambulatory Visit: Admitting: Medical
